# Patient Record
Sex: Female | Born: 1980 | Race: White | Hispanic: No | Marital: Married | Smoking: Former smoker
Health system: Southern US, Community
[De-identification: ages and names within clinical notes are randomized; demographics above are authoritative.]

## PROBLEM LIST (undated history)

## (undated) DIAGNOSIS — G43909 Migraine, unspecified, not intractable, without status migrainosus: Secondary | ICD-10-CM

## (undated) DIAGNOSIS — K219 Gastro-esophageal reflux disease without esophagitis: Secondary | ICD-10-CM

## (undated) DIAGNOSIS — Z8489 Family history of other specified conditions: Secondary | ICD-10-CM

## (undated) DIAGNOSIS — N200 Calculus of kidney: Secondary | ICD-10-CM

## (undated) DIAGNOSIS — J45909 Unspecified asthma, uncomplicated: Secondary | ICD-10-CM

## (undated) DIAGNOSIS — K911 Postgastric surgery syndromes: Secondary | ICD-10-CM

## (undated) DIAGNOSIS — T4145XA Adverse effect of unspecified anesthetic, initial encounter: Secondary | ICD-10-CM

## (undated) DIAGNOSIS — Z972 Presence of dental prosthetic device (complete) (partial): Secondary | ICD-10-CM

## (undated) DIAGNOSIS — Z98811 Dental restoration status: Secondary | ICD-10-CM

## (undated) DIAGNOSIS — M2241 Chondromalacia patellae, right knee: Secondary | ICD-10-CM

## (undated) DIAGNOSIS — T8859XA Other complications of anesthesia, initial encounter: Secondary | ICD-10-CM

## (undated) DIAGNOSIS — N809 Endometriosis, unspecified: Secondary | ICD-10-CM

## (undated) DIAGNOSIS — F909 Attention-deficit hyperactivity disorder, unspecified type: Secondary | ICD-10-CM

## (undated) DIAGNOSIS — G43019 Migraine without aura, intractable, without status migrainosus: Secondary | ICD-10-CM

## (undated) HISTORY — DX: Postgastric surgery syndromes: K91.1

## (undated) HISTORY — DX: Calculus of kidney: N20.0

## (undated) HISTORY — DX: Migraine without aura, intractable, without status migrainosus: G43.019

## (undated) HISTORY — PX: DIAGNOSTIC LAPAROSCOPY: SUR761

## (undated) HISTORY — PX: UPPER GI ENDOSCOPY: SHX6162

## (undated) HISTORY — DX: Gastro-esophageal reflux disease without esophagitis: K21.9

## (undated) HISTORY — DX: Attention-deficit hyperactivity disorder, unspecified type: F90.9

## (undated) HISTORY — DX: Endometriosis, unspecified: N80.9

---

## 2000-07-01 ENCOUNTER — Ambulatory Visit: Admission: RE | Admit: 2000-07-01 | Discharge: 2000-07-01 | Payer: Self-pay

## 2000-07-06 ENCOUNTER — Encounter
Admission: RE | Admit: 2000-07-06 | Discharge: 2000-07-21 | Payer: Self-pay | Admitting: Physical Medicine and Rehabilitation

## 2006-10-02 ENCOUNTER — Inpatient Hospital Stay (HOSPITAL_COMMUNITY): Admission: AD | Admit: 2006-10-02 | Discharge: 2006-10-02 | Payer: Self-pay | Admitting: Obstetrics & Gynecology

## 2008-07-20 ENCOUNTER — Ambulatory Visit (HOSPITAL_BASED_OUTPATIENT_CLINIC_OR_DEPARTMENT_OTHER): Admission: RE | Admit: 2008-07-20 | Discharge: 2008-07-20 | Payer: Self-pay | Admitting: Orthopedic Surgery

## 2008-07-20 HISTORY — PX: KNEE ARTHROSCOPY W/ LATERAL RELEASE: SHX1873

## 2010-03-20 ENCOUNTER — Ambulatory Visit: Payer: Self-pay | Admitting: Family Medicine

## 2011-01-14 NOTE — Op Note (Signed)
Janet Gallegos, Janet Gallegos             ACCOUNT NO.:  [id number]   MEDICAL RECORD NO.:  [id number]          PATIENT TYPE:  AMB   LOCATION:  [facility]                         FACILITY:  [facility]   PHYSICIAN:  Deidre Ala, M.D.    DATE OF BIRTH:  [birth year]   DATE OF PROCEDURE:  07/20/2008  DATE OF DISCHARGE:                               OPERATIVE REPORT   PRIMARY CARE PHYSICIAN:  Dr. Wilfrid Lund.   PREOPERATIVE DIAGNOSES:  1. 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  2. Medial and lateral plicas.  3. Tight lateral retinaculum.   POSTOPERATIVE DIAGNOSES:  1. 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  2. Medial and lateral plicas.  3. Tight lateral retinaculum.   PROCEDURE:  1. Right knee operative arthroscopy with medial and lateral plica      excisions.  2. Arthroscopic lateral retinacular release.   SURGEON:  1. Charlesetta Shanks, M.D.   ASSISTANT:  Phineas Semen, P.A.   ANESTHESIA:  General with LMA.   CULTURES:  None.   DRAINS:  None.   ESTIMATED BLOOD LOSS:  Minimal.   TOURNIQUET TIME:  22 minutes.   PATHOLOGIC FINDINGS AND HISTORY:  Janet Gallegos is a 30 year old female with  right knee pain, instability, catching, injury with history of patella  subluxations in the past.  She had a positive patella compression  apprehension test to lateral directed force with knee extension and a  significant lateral tilt of the patella on x-ray views.  She has pain  all around the patella with peripatellar synovitis.  She was presented  options and we felt the best would be to proceed with operative  arthroscopy, plica excision, ablation ablation chondroplasty as  indicated, lateral retinacular release for improved stability.   PROCEDURE IN DETAIL:  At surgery we found huge medial and lateral plicas  and a tight lateral retinaculum.  Her joint surfaces were soft on the  posterior medial patella but not completely broken down.  Her menisci  and ACL were  intact.  We did arthroscopic lateral retinacular release  for improved tilt and track and plica excisions.   With adequate anesthesia obtained using LMA technique, 1 gram Ancef  given IV prophylaxis, the patient was placed in the supine position.  The right lower extremity was prepped from the malleoli to the leg  holder in the standard fashion.  After standard prepping and draping  Esmarch site was used.  The tourniquet was let up to 350 mmHg.  Superior  and lateral inflow portal was made and the knee was insufflated with  normal saline with the arthroscopic pump.  Medial and lateral scope  portals were then made and the joint was thoroughly inspected.  I then  lysed the medial plica back to the sidewall and lysed the medial band.  I then probed the posterior patella.  I  checked the medial meniscus and  probed it.  ACL, lateral meniscus and probed it.  I reversed portals,  shaved out the lateral plica, observed tilt and track.  I then did an  arthroscopic lateral retinacular release from vastus lateralis to the  joint  line with the hook ArthroCare.  Bleeding points were cauterized.  The knee was irrigated through the scope 0.5% Marcaine with morphine  injected in and about the portals.  The portals were left open.  A bulky  sterile compressive dressing was applied with a lateral foam pad for  tamponade and EZ wrap placed.  The patient then having tolerated the  procedure well was awakened and taken to the recovery room in  satisfactory condition to be discharged per outpatient routine, given  Percocet for pain and told to call the office for an appointment for  recheck tomorrow.      Deidre Ala, M.D.  Electronically Signed     VEP/MEDQ  D:  07/20/2008  T:  07/20/2008  Job:  [id number]   cc:   [facility]   Wilfrid Lund  Fax: [phone]

## 2011-05-21 DIAGNOSIS — K644 Residual hemorrhoidal skin tags: Secondary | ICD-10-CM | POA: Insufficient documentation

## 2011-06-03 LAB — POCT HEMOGLOBIN-HEMACUE: Hemoglobin: 13.5

## 2011-06-03 LAB — POCT PREGNANCY, URINE: Preg Test, Ur: NEGATIVE

## 2012-06-20 ENCOUNTER — Ambulatory Visit: Payer: Self-pay | Admitting: Internal Medicine

## 2013-03-22 ENCOUNTER — Ambulatory Visit: Payer: Self-pay | Admitting: Specialist

## 2013-09-15 ENCOUNTER — Ambulatory Visit: Payer: Self-pay | Admitting: Gastroenterology

## 2013-10-04 ENCOUNTER — Ambulatory Visit: Payer: Self-pay | Admitting: Gastroenterology

## 2014-10-31 DIAGNOSIS — M2241 Chondromalacia patellae, right knee: Secondary | ICD-10-CM

## 2014-10-31 HISTORY — DX: Chondromalacia patellae, right knee: M22.41

## 2014-11-06 ENCOUNTER — Encounter (HOSPITAL_BASED_OUTPATIENT_CLINIC_OR_DEPARTMENT_OTHER): Payer: Self-pay | Admitting: *Deleted

## 2014-11-06 ENCOUNTER — Other Ambulatory Visit: Payer: Self-pay | Admitting: Orthopaedic Surgery

## 2014-11-09 NOTE — H&P (Signed)
Yemariam Magar Salonga is an 34 y.o. female.   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  She is here in referral through Dr. Renae Fickle about her right knee.  She's had trouble for many years.  He actually performed an arthroscopy with lateral release in 2009.  She went through therapy and did fairly well for many years.  She's used taping and braces.  Unfortunately things got much worse over the last year.  She continues on diclofenac for this problem and uses a brace but it has become limiting to her.  Her greatest trouble is pain and not so much instability.  She avoids stairs and squatting and lunging.  She had an injection this year by Dr. Renae Fickle.  She also apparently has been through a [place] series.   MRI:  I reviewed an MRI scan films and report of a study done at the [facility] on 05/21/14 of the right knee which demonstrates a trace knee effusion with superior plica.  There is a mild regular thickening of the deep inferior components of the medial patellar retinaculum.  There is a mild proximal patellar tendinopathy.  Mild general chondral thinning inferiorly on the patella is also noted.  Past Medical History  Diagnosis Date  . Migraines   . GERD (gastroesophageal reflux disease)     no current med.  . Asthma     prn inhaler  . Complication of anesthesia     states is hard to wake up post-op  . Family history of adverse reaction to anesthesia     states pt's mother woke up during a surgery  . 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 Dental bridge present upper  . Dental crown present     lower    Past Surgical History  Procedure Laterality Date  . Diagnostic laparoscopy      x 2  . Upper gi endoscopy    . Knee arthroscopy w/ lateral release Right 07/20/2008    Family History  Problem Relation Age of Onset  . Anesthesia problems Mother     woke up during surgery   Social History:  reports that she has been smoking  Cigarettes.  She has been smoking about 0.00 packs per day for the past 4 years. She has never used smokeless tobacco. She reports that she drinks alcohol. Her drug history is not on file.  Allergies:  Allergies  Allergen Reactions  . Sulfa Antibiotics Nausea And Vomiting    No prescriptions prior to admission    No results found for this or any previous visit (from the past 48 hour(s)). No results found.  Review of Systems  Musculoskeletal: Positive for joint pain.       Right knee  All other systems reviewed and are negative.   Height  (1.626 m), weight 65.772 kg (145 lb), last menstrual period 10/26/2014. Physical Exam  Constitutional: She is oriented to person, place, and time. She appears well-developed and well-nourished.  HENT:  Head: Normocephalic and atraumatic.  Eyes: Conjunctivae are normal. Pupils are equal, round, and reactive to light.  Neck: Normal range of motion.  Cardiovascular: Normal rate and regular rhythm.   Respiratory: Effort normal.  GI: Soft.  Musculoskeletal:  Right knee has no effusion.  She has some mild crepitation.  Her motion is full.  I get just some mild apprehension to patellar subluxation.  I cannot sublux her patella anymore than I can the patella on the  opposite knee.  Her quadriceps musculature looks normal.  Hip motion is full and pain-free.  Ligament exam of the knee is stable.  She has no joint line pain.  There is no palpable lymphadenopathy behind her knee.  Sensation and motor function are intact in her feet with palpable pulses on both sides.    Neurological: She is alert and oriented to person, place, and time.  Skin: Skin is warm and dry.  Psychiatric: She has a normal mood and affect. Her behavior is normal. Judgment and thought content normal.     Assessment/Plan Assessment: Right knee chondromalacia and mild instability with arthroscopy and lateral release 2009  Plan: Ashok CordiaSavanna has a combination of chondromalacia patella  and probably some mild instability.  Her greatest symptom seems to be pain rather than instability.  She's been through extensive physical therapy and bracing and even an arthroscopic lateral release back in 2009.  She's also been through multiple cortisone injections and apparently a course of [place] supplementation.  This remains a big problem for her.  I think what we can offer her surgically is a Fulkerson slide.  We would orient the cut obliquely to effectively elevate the tibial tubercle us we bring it in a medial direction.  Hopefully this will decompress her patellofemoral joint and lead to some relief.  I've gone over the nature this outpatient operation and risk of anesthesia, infection, and DVT.  Also stressed the importance of the postoperative rehabilitation protocol to optimize her result.  I think she might miss a week or two of her mostly seated work after this type of intervention.   Khristie Sak, Ginger OrganNDREW PAUL 11/09/2014, 11:51 AM

## 2014-11-10 ENCOUNTER — Ambulatory Visit (HOSPITAL_BASED_OUTPATIENT_CLINIC_OR_DEPARTMENT_OTHER): Payer: 59 | Admitting: Anesthesiology

## 2014-11-10 ENCOUNTER — Ambulatory Visit (HOSPITAL_BASED_OUTPATIENT_CLINIC_OR_DEPARTMENT_OTHER)
Admission: RE | Admit: 2014-11-10 | Discharge: 2014-11-10 | Disposition: A | Discharge status: Home / Self Care | Payer: 59 | Source: Ambulatory Visit | Attending: Orthopaedic Surgery | Admitting: Orthopaedic Surgery

## 2014-11-10 ENCOUNTER — Encounter (HOSPITAL_BASED_OUTPATIENT_CLINIC_OR_DEPARTMENT_OTHER)
Admission: RE | Disposition: A | Discharge status: Home / Self Care | Payer: Self-pay | Source: Ambulatory Visit | Attending: Orthopaedic Surgery

## 2014-11-10 ENCOUNTER — Encounter (HOSPITAL_BASED_OUTPATIENT_CLINIC_OR_DEPARTMENT_OTHER): Payer: Self-pay | Admitting: Anesthesiology

## 2014-11-10 DIAGNOSIS — K219 Gastro-esophageal reflux disease without esophagitis: Secondary | ICD-10-CM | POA: Diagnosis not present

## 2014-11-10 DIAGNOSIS — M2241 Chondromalacia patellae, right knee: Secondary | ICD-10-CM | POA: Insufficient documentation

## 2014-11-10 DIAGNOSIS — Z882 Allergy status to sulfonamides status: Secondary | ICD-10-CM | POA: Insufficient documentation

## 2014-11-10 DIAGNOSIS — M2211 Recurrent subluxation of patella, right knee: Secondary | ICD-10-CM | POA: Insufficient documentation

## 2014-11-10 DIAGNOSIS — G43909 Migraine, unspecified, not intractable, without status migrainosus: Secondary | ICD-10-CM | POA: Insufficient documentation

## 2014-11-10 DIAGNOSIS — J45909 Unspecified asthma, uncomplicated: Secondary | ICD-10-CM | POA: Insufficient documentation

## 2014-11-10 DIAGNOSIS — F17210 Nicotine dependence, cigarettes, uncomplicated: Secondary | ICD-10-CM | POA: Diagnosis not present

## 2014-11-10 HISTORY — DX: Migraine, unspecified, not intractable, without status migrainosus: G43.909

## 2014-11-10 HISTORY — DX: Gastro-esophageal reflux disease without esophagitis: K21.9

## 2014-11-10 HISTORY — DX: Unspecified asthma, uncomplicated: J45.909

## 2014-11-10 HISTORY — DX: Adverse effect of unspecified anesthetic, initial encounter: T41.45XA

## 2014-11-10 HISTORY — DX: Dental restoration status: Z98.811

## 2014-11-10 HISTORY — DX: Presence of dental prosthetic device (complete) (partial): Z97.2

## 2014-11-10 HISTORY — PX: KNEE ARTHROSCOPY WITH FULKERSON SLIDE: SHX5648

## 2014-11-10 HISTORY — DX: Other complications of anesthesia, initial encounter: T88.59XA

## 2014-11-10 HISTORY — DX: Chondromalacia patellae, right knee: M22.41

## 2014-11-10 HISTORY — PX: CHONDROPLASTY: SHX5177

## 2014-11-10 HISTORY — PX: KNEE ARTHROSCOPY WITH LATERAL RELEASE: SHX5649

## 2014-11-10 HISTORY — DX: Family history of other specified conditions: Z84.89

## 2014-11-10 LAB — POCT HEMOGLOBIN-HEMACUE: Hemoglobin: 12.8 g/dL (ref 12.0–15.0)

## 2014-11-10 SURGERY — ARTHROSCOPY, KNEE, WITH FULKERSON OSTEOTOMY
Anesthesia: General | Site: Knee | Laterality: Right

## 2014-11-10 MED ORDER — ONDANSETRON HCL 4 MG/2ML IJ SOLN
INTRAMUSCULAR | Status: DC | PRN
  Administered 2014-11-10: 4 mg via INTRAVENOUS

## 2014-11-10 MED ORDER — SUCCINYLCHOLINE CHLORIDE 20 MG/ML IJ SOLN
INTRAMUSCULAR | Status: AC
  Filled 2014-11-10: qty 1

## 2014-11-10 MED ORDER — BUPIVACAINE-EPINEPHRINE (PF) 0.5% -1:200000 IJ SOLN
INTRAMUSCULAR | Status: DC | PRN
  Administered 2014-11-10: 25 mL via PERINEURAL

## 2014-11-10 MED ORDER — MIDAZOLAM HCL 2 MG/2ML IJ SOLN
INTRAMUSCULAR | Status: AC
  Filled 2014-11-10: qty 2

## 2014-11-10 MED ORDER — LACTATED RINGERS IV SOLN
INTRAVENOUS | Status: DC
  Administered 2014-11-10: 12:00:00 via INTRAVENOUS

## 2014-11-10 MED ORDER — OXYCODONE HCL 5 MG PO TABS
5.0000 mg | ORAL_TABLET | Freq: Once | ORAL | Status: AC | PRN
  Administered 2014-11-10: 5 mg via ORAL

## 2014-11-10 MED ORDER — FENTANYL CITRATE 0.05 MG/ML IJ SOLN
INTRAMUSCULAR | Status: AC
  Filled 2014-11-10: qty 6

## 2014-11-10 MED ORDER — DEXAMETHASONE SODIUM PHOSPHATE 4 MG/ML IJ SOLN
INTRAMUSCULAR | Status: DC | PRN
  Administered 2014-11-10: 10 mg via INTRAVENOUS

## 2014-11-10 MED ORDER — LIDOCAINE HCL (CARDIAC) 20 MG/ML IV SOLN
INTRAVENOUS | Status: DC | PRN
  Administered 2014-11-10: 50 mg via INTRAVENOUS

## 2014-11-10 MED ORDER — ONDANSETRON HCL 4 MG/2ML IJ SOLN: 4.0000 mg | Freq: Once | INTRAMUSCULAR | Status: DC | PRN

## 2014-11-10 MED ORDER — HYDROMORPHONE HCL 1 MG/ML IJ SOLN
0.2500 mg | INTRAMUSCULAR | Status: DC | PRN
  Administered 2014-11-10 (×3): 0.5 mg via INTRAVENOUS

## 2014-11-10 MED ORDER — OXYCODONE HCL 5 MG PO TABS
ORAL_TABLET | ORAL | Status: AC
  Filled 2014-11-10: qty 1

## 2014-11-10 MED ORDER — SODIUM CHLORIDE 0.9 % IR SOLN
Status: DC | PRN
  Administered 2014-11-10: 3000 mL

## 2014-11-10 MED ORDER — FENTANYL CITRATE 0.05 MG/ML IJ SOLN
50.0000 ug | INTRAMUSCULAR | Status: DC | PRN
  Administered 2014-11-10: 100 ug via INTRAVENOUS

## 2014-11-10 MED ORDER — CHLORHEXIDINE GLUCONATE 4 % EX LIQD: 60.0000 mL | Freq: Once | CUTANEOUS | Status: DC

## 2014-11-10 MED ORDER — HYDROMORPHONE HCL 1 MG/ML IJ SOLN
INTRAMUSCULAR | Status: AC
  Filled 2014-11-10: qty 1

## 2014-11-10 MED ORDER — OXYCODONE HCL 5 MG/5ML PO SOLN: 5.0000 mg | Freq: Once | ORAL | Status: AC | PRN

## 2014-11-10 MED ORDER — FENTANYL CITRATE 0.05 MG/ML IJ SOLN
INTRAMUSCULAR | Status: DC | PRN
Start: 2014-11-10 — End: 2014-11-10
  Administered 2014-11-10 (×2): 50 ug via INTRAVENOUS

## 2014-11-10 MED ORDER — MIDAZOLAM HCL 5 MG/5ML IJ SOLN
INTRAMUSCULAR | Status: DC | PRN
  Administered 2014-11-10: 2 mg via INTRAVENOUS

## 2014-11-10 MED ORDER — LACTATED RINGERS IV SOLN
INTRAVENOUS | Status: DC
  Administered 2014-11-10 (×2): via INTRAVENOUS
  Administered 2014-11-10: 10 mL/h via INTRAVENOUS

## 2014-11-10 MED ORDER — MIDAZOLAM HCL 2 MG/2ML IJ SOLN
1.0000 mg | INTRAMUSCULAR | Status: DC | PRN
  Administered 2014-11-10: 2 mg via INTRAVENOUS

## 2014-11-10 MED ORDER — FENTANYL CITRATE 0.05 MG/ML IJ SOLN
INTRAMUSCULAR | Status: AC
  Filled 2014-11-10: qty 2

## 2014-11-10 MED ORDER — MIDAZOLAM HCL 2 MG/ML PO SYRP: 12.0000 mg | ORAL_SOLUTION | Freq: Once | ORAL | Status: DC | PRN

## 2014-11-10 MED ORDER — CEFAZOLIN SODIUM-DEXTROSE 2-3 GM-% IV SOLR
2.0000 g | INTRAVENOUS | Status: AC
  Administered 2014-11-10: 2 g via INTRAVENOUS

## 2014-11-10 MED ORDER — HYDROCODONE-ACETAMINOPHEN 5-325 MG PO TABS: 1.0000 | ORAL_TABLET | Freq: Four times a day (QID) | ORAL | Status: DC | PRN

## 2014-11-10 MED ORDER — PROPOFOL 10 MG/ML IV BOLUS
INTRAVENOUS | Status: DC | PRN
  Administered 2014-11-10: 200 mg via INTRAVENOUS

## 2014-11-10 SURGICAL SUPPLY — 69 items
BANDAGE ELASTIC 6 VELCRO ST LF (GAUZE/BANDAGES/DRESSINGS) ×3 IMPLANT
BANDAGE ESMARK 6X9 LF (GAUZE/BANDAGES/DRESSINGS) ×2 IMPLANT
BENZOIN TINCTURE PRP APPL 2/3 (GAUZE/BANDAGES/DRESSINGS) ×3 IMPLANT
BIT DRILL 2.5X110 QC LCP DISP (BIT) ×3 IMPLANT
BLADE CUDA 5.5 (BLADE) IMPLANT
BLADE GREAT WHITE 4.2 (BLADE) ×3 IMPLANT
BLADE MICRO SAGITTAL (BLADE) IMPLANT
BLADE SAGITTAL 25.0X1.27X90 (BLADE) ×3 IMPLANT
BLADE SURG 15 STRL LF DISP TIS (BLADE) ×2 IMPLANT
BLADE SURG 15 STRL SS (BLADE) ×1
BNDG ESMARK 6X9 LF (GAUZE/BANDAGES/DRESSINGS) ×3
BNDG GAUZE ELAST 4 BULKY (GAUZE/BANDAGES/DRESSINGS) ×3 IMPLANT
COVER BACK TABLE 60X90IN (DRAPES) ×3 IMPLANT
CUFF TOURNIQUET SINGLE 24IN (TOURNIQUET CUFF) IMPLANT
CUFF TOURNIQUET SINGLE 34IN LL (TOURNIQUET CUFF) ×3 IMPLANT
DRAPE ARTHROSCOPY W/POUCH 114 (DRAPES) ×3 IMPLANT
DRAPE OEC MINIVIEW 54X84 (DRAPES) ×3 IMPLANT
DRAPE U-SHAPE 47X51 STRL (DRAPES) ×3 IMPLANT
DRSG EMULSION OIL 3X3 NADH (GAUZE/BANDAGES/DRESSINGS) ×3 IMPLANT
DURAPREP 26ML APPLICATOR (WOUND CARE) ×3 IMPLANT
ELECT REM PT RETURN 9FT ADLT (ELECTROSURGICAL) ×3
ELECTRODE REM PT RTRN 9FT ADLT (ELECTROSURGICAL) ×2 IMPLANT
GAUZE SPONGE 4X4 12PLY STRL (GAUZE/BANDAGES/DRESSINGS) ×3 IMPLANT
GLOVE BIO SURGEON STRL SZ8 (GLOVE) ×6 IMPLANT
GLOVE BIOGEL PI IND STRL 6.5 (GLOVE) ×2 IMPLANT
GLOVE BIOGEL PI IND STRL 7.0 (GLOVE) ×4 IMPLANT
GLOVE BIOGEL PI IND STRL 8 (GLOVE) ×4 IMPLANT
GLOVE BIOGEL PI INDICATOR 6.5 (GLOVE) ×1
GLOVE BIOGEL PI INDICATOR 7.0 (GLOVE) ×2
GLOVE BIOGEL PI INDICATOR 8 (GLOVE) ×2
GOWN STRL REUS W/ TWL LRG LVL3 (GOWN DISPOSABLE) ×2 IMPLANT
GOWN STRL REUS W/ TWL XL LVL3 (GOWN DISPOSABLE) ×4 IMPLANT
GOWN STRL REUS W/TWL LRG LVL3 (GOWN DISPOSABLE) ×1
GOWN STRL REUS W/TWL XL LVL3 (GOWN DISPOSABLE) ×2
IMMOBILIZER KNEE 22 UNIV (SOFTGOODS) ×3 IMPLANT
IMMOBILIZER KNEE 24 THIGH 36 (MISCELLANEOUS) IMPLANT
IMMOBILIZER KNEE 24 UNIV (MISCELLANEOUS)
IV NS IRRIG 3000ML ARTHROMATIC (IV SOLUTION) ×6 IMPLANT
KNEE WRAP E Z 3 GEL PACK (MISCELLANEOUS) ×3 IMPLANT
MANIFOLD NEPTUNE II (INSTRUMENTS) ×3 IMPLANT
NDL SUT 6 .5 CRC .975X.05 MAYO (NEEDLE) IMPLANT
NEEDLE MAYO TAPER (NEEDLE)
PACK ARTHROSCOPY DSU (CUSTOM PROCEDURE TRAY) ×3 IMPLANT
PACK BASIN DAY SURGERY FS (CUSTOM PROCEDURE TRAY) ×3 IMPLANT
PENCIL BUTTON HOLSTER BLD 10FT (ELECTRODE) ×3 IMPLANT
SCREW CANC PT 4.0X45 (Screw) ×6 IMPLANT
SET ARTHROSCOPY TUBING (MISCELLANEOUS) ×1
SET ARTHROSCOPY TUBING LN (MISCELLANEOUS) ×2 IMPLANT
SHEET MEDIUM DRAPE 40X70 STRL (DRAPES) ×3 IMPLANT
SPONGE LAP 4X18 X RAY DECT (DISPOSABLE) ×3 IMPLANT
STRIP CLOSURE SKIN 1/2X4 (GAUZE/BANDAGES/DRESSINGS) ×3 IMPLANT
SUCTION FRAZIER TIP 10 FR DISP (SUCTIONS) ×3 IMPLANT
SUT FIBERWIRE #2 38 T-5 BLUE (SUTURE)
SUT MNCRL AB 3-0 PS2 18 (SUTURE) ×3 IMPLANT
SUT PDS AB 2-0 CT2 27 (SUTURE) ×3 IMPLANT
SUT VIC AB 0 CT1 27 (SUTURE) ×1
SUT VIC AB 0 CT1 27XBRD ANBCTR (SUTURE) ×2 IMPLANT
SUT VIC AB 0 SH 27 (SUTURE) IMPLANT
SUT VIC AB 2-0 SH 27 (SUTURE) ×1
SUT VIC AB 2-0 SH 27XBRD (SUTURE) ×2 IMPLANT
SUTURE FIBERWR #2 38 T-5 BLUE (SUTURE) IMPLANT
SYR 3ML 18GX1 1/2 (SYRINGE) IMPLANT
SYR BULB 3OZ (MISCELLANEOUS) ×3 IMPLANT
TOWEL OR 17X24 6PK STRL BLUE (TOWEL DISPOSABLE) ×6 IMPLANT
TOWEL OR NON WOVEN STRL DISP B (DISPOSABLE) ×6 IMPLANT
WAND 30 DEG SABER W/CORD (SURGICAL WAND) ×3 IMPLANT
WAND STAR VAC 90 (SURGICAL WAND) IMPLANT
WATER STERILE IRR 1000ML POUR (IV SOLUTION) ×3 IMPLANT
YANKAUER SUCT BULB TIP NO VENT (SUCTIONS) IMPLANT

## 2014-11-10 NOTE — Op Note (Signed)
#  [id number] 

## 2014-11-10 NOTE — Discharge Instructions (Signed)
Regional Anesthesia Blocks ° °1. Numbness or the inability to move the "blocked" extremity may last from 3-48 hours after placement. The length of time depends on the medication injected and your individual response to the medication. If the numbness is not going away after 48 hours, call your surgeon. ° °2. The extremity that is blocked will need to be protected until the numbness is gone and the  Strength has returned. Because you cannot feel it, you will need to take extra care to avoid injury. Because it may be weak, you may have difficulty moving it or using it. You may not know what position it is in without looking at it while the block is in effect. ° °3. For blocks in the legs and feet, returning to weight bearing and walking needs to be done carefully. You will need to wait until the numbness is entirely gone and the strength has returned. You should be able to move your leg and foot normally before you try and bear weight or walk. You will need someone to be with you when you first try to ensure you do not fall and possibly risk injury. ° °4. Bruising and tenderness at the needle site are common side effects and will resolve in a few days. ° °5. Persistent numbness or new problems with movement should be communicated to the surgeon or the [facility] ([phone])/ [facility] ([phone]). ° ° ° °Post Anesthesia Home Care Instructions ° °Activity: °Get plenty of rest for the remainder of the day. A responsible adult should stay with you for 24 hours following the procedure.  °For the next 24 hours, DO NOT: °-Drive a car °-Operate machinery °-Drink alcoholic beverages °-Take any medication unless instructed by your physician °-Make any legal decisions or sign important papers. ° °Meals: °Start with liquid foods such as gelatin or soup. Progress to regular foods as tolerated. Avoid greasy, spicy, heavy foods. If nausea and/or vomiting occur, drink only clear liquids until the  nausea and/or vomiting subsides. Call your physician if vomiting continues. ° °Special Instructions/Symptoms: °Your throat may feel dry or sore from the anesthesia or the breathing tube placed in your throat during surgery. If this causes discomfort, gargle with warm salt water. The discomfort should disappear within 24 hours. ° °

## 2014-11-10 NOTE — Progress Notes (Signed)
Assisted Dr. Crews with right, ultrasound guided, femoral block. Side rails up, monitors on throughout procedure. See vital signs in flow sheet. Tolerated Procedure well. 

## 2014-11-10 NOTE — Anesthesia Postprocedure Evaluation (Signed)
  Anesthesia Post-op Note  Patient: Janet Gallegos  Procedure(s) Performed: Procedure(s): RIGHT KNEE ARTHROSCOPY WITH FULKERSON SLIDE (Right) CHONDROPLASTY (Right) KNEE ARTHROSCOPY WITH LATERAL RELEASE (Right)  Patient Location: PACU  Anesthesia Type: General with regional for post op pain   Level of Consciousness: awake, alert  and oriented  Airway and Oxygen Therapy: Patient Spontanous Breathing  Post-op Pain: mild  Post-op Assessment: Post-op Vital signs reviewed  Post-op Vital Signs: Reviewed  Last Vitals:  Filed Vitals:   11/10/14 1600  BP: 107/69  Pulse: 74  Temp:   Resp: 10    Complications: No apparent anesthesia complications

## 2014-11-10 NOTE — Anesthesia Preprocedure Evaluation (Signed)
Anesthesia Evaluation  Patient identified by MRN, date of birth, ID band Patient awake    Reviewed: Allergy & Precautions, NPO status , Patient's Chart, lab work & pertinent test results  Airway Mallampati: I  TM Distance: >3 FB Neck ROM: Full    Dental  (+) Teeth Intact, Dental Advisory Given   Pulmonary asthma , Current Smoker,  breath sounds clear to auscultation        Cardiovascular Rhythm:Regular Rate:Normal     Neuro/Psych    GI/Hepatic GERD-  Medicated and Controlled,  Endo/Other    Renal/GU      Musculoskeletal   Abdominal   Peds  Hematology   Anesthesia Other Findings   Reproductive/Obstetrics                             Anesthesia Physical Anesthesia Plan  ASA: II  Anesthesia Plan: General   Post-op Pain Management:    Induction: Intravenous  Airway Management Planned: LMA  Additional Equipment:   Intra-op Plan:   Post-operative Plan: Extubation in OR  Informed Consent: I have reviewed the patients History and Physical, chart, labs and discussed the procedure including the risks, benefits and alternatives for the proposed anesthesia with the patient or authorized representative who has indicated his/her understanding and acceptance.   Dental advisory given  Plan Discussed with: CRNA, Anesthesiologist and Surgeon  Anesthesia Plan Comments:         Anesthesia Quick Evaluation

## 2014-11-10 NOTE — Interval H&P Note (Signed)
OK for surgery PD 

## 2014-11-10 NOTE — Transfer of Care (Signed)
Immediate Anesthesia Transfer of Care Note  Patient: Kandice RobinsonsSavanna L Mcculloh  Procedure(s) Performed: Procedure(s): RIGHT KNEE ARTHROSCOPY WITH FULKERSON SLIDE (Right) CHONDROPLASTY (Right) KNEE ARTHROSCOPY WITH LATERAL RELEASE (Right)  Patient Location: PACU  Anesthesia Type:GA combined with regional for post-op pain  Level of Consciousness: alert  and patient cooperative  Airway & Oxygen Therapy: Patient Spontanous Breathing and Patient connected to face mask oxygen  Post-op Assessment: Report given to RN and Post -op Vital signs reviewed and stable  Post vital signs: Reviewed and stable  Last Vitals:  Filed Vitals:   11/10/14 1310  BP:   Pulse: 82  Temp:   Resp: 12    Complications: No apparent anesthesia complications

## 2014-11-10 NOTE — Anesthesia Procedure Notes (Addendum)
Anesthesia Regional Block:  Femoral nerve block  Pre-Anesthetic Checklist: ,, timeout performed, Correct Patient, Correct Site, Correct Laterality, Correct Procedure, Correct Position, site marked, Risks and benefits discussed,  Surgical consent,  Pre-op evaluation,  At surgeon's request and post-op pain management  Laterality: Right and Lower  Prep: chloraprep       Needles:  Injection technique: Single-shot  Needle Type: Echogenic Needle     Needle Length: 9cm 9 cm Needle Gauge: 21 and 21 G    Additional Needles:  Procedures: ultrasound guided (picture in chart) Femoral nerve block Narrative:  Start time: 11/10/2014 12:36 PM End time: 11/10/2014 12:43 PM Injection made incrementally with aspirations every 5 mL.  Performed by: Personally    Procedure Name: LMA Insertion Date/Time: 11/10/2014 1:43 PM Performed by: [place] DesanctisLINKA, Mackena Plummer L Pre-anesthesia Checklist: Patient identified, Emergency Drugs available, Suction available, Patient being monitored and Timeout performed Patient Re-evaluated:Patient Re-evaluated prior to inductionOxygen Delivery Method: Circle System Utilized Preoxygenation: Pre-oxygenation with 100% oxygen Intubation Type: IV induction Ventilation: Mask ventilation without difficulty LMA: LMA inserted LMA Size: 4.0 Number of attempts: 1 Airway Equipment and Method: Bite block Placement Confirmation: positive ETCO2 Tube secured with: Tape Dental Injury: Teeth and Oropharynx as per pre-operative assessment

## 2014-11-13 ENCOUNTER — Encounter (HOSPITAL_BASED_OUTPATIENT_CLINIC_OR_DEPARTMENT_OTHER): Payer: Self-pay | Admitting: Orthopaedic Surgery

## 2014-11-13 NOTE — Op Note (Signed)
NAMEWREN, PRYCE             ACCOUNT NO.:  [id number]  MEDICAL RECORD NO.:  [id number]  LOCATION:                                 FACILITY:  PHYSICIAN:  Lubertha Basque. Rowdy Guerrini, M.D.DATE OF BIRTH:  1981/05/28  DATE OF PROCEDURE:  11/10/2014 DATE OF DISCHARGE:  11/10/2014                              OPERATIVE REPORT   PREOPERATIVE DIAGNOSIS:  Right knee chondromalacia patella.  POSTOPERATIVE DIAGNOSIS:  Right knee chondromalacia patella.  PROCEDURE: 1. Right knee arthroscopic chondroplasty. 2. Right knee arthroscopic lateral release. 3. Right knee open Fulkerson slide procedure.  ANESTHESIA:  General and block.  ATTENDING SURGEON:  Lubertha Basque. Jerl Santos, M.D.  ASSISTANT:  Elodia Florence, PA.  INDICATION FOR PROCEDURE:  The patient is a 34 year old woman with a long history of anterior knee pain and possible subluxation of the patella.  She has been treated with physical therapy and bracing and even an arthroscopic lateral release in years past.  Unfortunately, she persists with difficulty.  This pain limits her ability to climb stairs and squat and remain active.  She is offered a Fulkerson slide procedure and arthroscopy at this point.  Informed operative consent was obtained after discussion of possible complications including reaction to anesthesia, infection, DVT.  The importance of postoperative rehabilitation protocol to optimize result was stressed extensively with the patient.  SUMMARY OF FINDINGS AND PROCEDURE:  Under general anesthesia and a block, a right knee arthroscopy was performed followed by a Fulkerson slide procedure.  On arthroscopy, she had some breakdown of the inferior pole of the patella and to a lesser degree the medial aspect of the intertrochlear groove.  There was no exposed bone.  A chondroplasty was done.  The patella did track in a slightly lateral position and there was some significant pressure on drive-through at the patellofemoral joint.  I  performed an arthroscopic lateral release and then we performed an open Fulkerson slide procedure with the saw blade angled under direction to enable [place] to elevate the tibial tubercle as much as we took it medial.  It was secured with 2 small fragment set screws. The scope was then reintroduced and the pressure in the patellofemoral joint had been greatly decreased.  The patella tracked in a normal position.  She was closed primarily and discharged home.  Elodia Florence assisted throughout and was invaluable to the completion of the case mostly in that he helped pass instruments to make this possible.  He also closed simultaneously to help minimize OR time.  I used fluoroscopy throughout the case and read these views myself.  This was necessary to judge placement of hardware in the tibia.  DESCRIPTION OF PROCEDURE:  The patient was taken to the operating suite where general anesthetic was applied without difficulty.  She was also given a block in the pre-anesthesia area.  She was positioned supine and prepped and draped in normal sterile fashion.  After the administration of preop IV Kefzol and an appropriate time out, the right leg was elevated, exsanguinated, and tourniquet inflated about the thigh.  We then performed an arthroscopy through a total of 3 portals.  There were no degenerative changes in the medial and lateral and the meniscal  structures appeared normal.  The ACL was intact.  The patellofemoral joint was as described above.  A chondroplasty was done but no bare bone was exposed.  I then performed the arthroscopic lateral release with an arthroscopic cautery device.  I did my best to control some small bleeders with the cautery.  We then removed the arthroscopic equipment. I made an anteromedial longitudinal incision from the tibial tubercle distal.  Dissection was carried down to the insertion of the patellar tendon at the tibial tubercle.  I extended the lateral release to  the lateral border of this structure.  I then went down the medial border and planned our saw cut.  I cut from medial to lateral and tapered from proximal to distal.  I angled the saw blade 45 degrees to enable us to bring the tibial tubercle anterior as we shifted it medial after the osteotomy.  Once the osteotomy was completed, I shifted this structure medial about a cm.  This also took off the anterior about the same amount.  I did leave this attached distally so as not to change the length of the extensor mechanism.  I then placed 2 partially threaded small fragment site cancellous screws across the osteotomy site.  I used fluoroscopy to confirm that there was minimal penetration of the posterior cortex.  The knee ranged fully and our osteotomy was felt to be very stable with our screw fixation.  The scope was then reintroduced.  At this point, there was an easy drive-through sign consistent with minimal pressure at the patellofemoral joint.  The patella tracked in a completely normal position on flexion.  The wounds were irrigated followed by removal of arthroscopic equipment.  The tourniquet was deflated and a small amount of bleeding was easily controlled with some pressure and Bovie cautery.  We reapproximated the deep tissues with 0 Vicryl and the subcutaneous tissues with 2-0 undyed Vicryl.  Skin was reapproximated with a subcuticular stitch followed by Steri-Strips with benzoin.  Adaptic was applied followed by dry gauze and loose Ace wrap and a knee immobilizer.  Estimated blood loss and intraoperative fluids can be obtained from anesthesia records as can accurate tourniquet time.  DISPOSITION:  The patient was extubated in the operating room and taken to recovery room in stable condition.  She was to go home same-day and follow up in the office in less than a week.  I will contact her by phone tonight.     Lubertha BasquePeter G. Jerl Santosalldorf, M.D.     PGD/MEDQ  D:  11/10/2014  T:   11/11/2014  Job:  [id number]

## 2015-07-19 ENCOUNTER — Encounter: Payer: Self-pay | Admitting: *Deleted

## 2015-07-30 NOTE — Discharge Instructions (Signed)
T & A INSTRUCTION SHEET - [facility] °[facility] ° °CREIGHTON VAUGHT, MD °PAUL H. JUENGEL, MD  °P. SCOTT BENNETT °CHAPMAN MCQUEEN, MD ° °[address] [place], [place] [address] TEL. [phone] °[address] [place] [address] [phone] ° °INFORMATION SHEET FOR A TONSILLECTOMY AND ADENDOIDECTOMY ° °About Your Tonsils and Adenoids ° The tonsils and adenoids are normal body tissues that are part of our immune system.  They normally help to protect us against diseases that may enter our mouth and nose.  However, sometimes the tonsils and/or adenoids become too large and obstruct our breathing, especially at night. °  ° If either of these things happen it helps to remove the tonsils and adenoids in order to become healthier. The operation to remove the tonsils and adenoids is called a tonsillectomy and adenoidectomy. ° °The Location of Your Tonsils and Adenoids ° The tonsils are located in the back of the throat on both side and sit in a cradle of muscles. The adenoids are located in the roof of the mouth, behind the nose, and closely associated with the opening of the Eustachian tube to the ear. ° °Surgery on Tonsils and Adenoids ° A tonsillectomy and adenoidectomy is a short operation which takes about thirty minutes.  This includes being put to sleep and being awakened.  Tonsillectomies and adenoidectomies are performed at [facility] and may require observation period in the recovery room prior to going home. ° °Following the Operation for a Tonsillectomy ° A cautery machine is used to control bleeding.  Bleeding from a tonsillectomy and adenoidectomy is minimal and postoperatively the risk of bleeding is approximately four percent, although this rarely life threatening. ° ° ° °After your tonsillectomy and adenoidectomy post-op care at home: ° °1. Our patients are able to go home the same day.  You may be given prescriptions for pain  medications and antibiotics, if indicated. °2. It is extremely important to remember that fluid intake is of utmost importance after a tonsillectomy.  The amount that you drink must be maintained in the postoperative period.  A good indication of whether a child is getting enough fluid is whether his/her urine output is constant.  As long as children are urinating or wetting their diaper every 6 - 8 hours this is usually enough fluid intake.   °3. Although rare, this is a risk of some bleeding in the first ten days after surgery.  This is usually occurs between day five and nine postoperatively.  This risk of bleeding is approximately four percent.  If you or your child should have any bleeding you should remain calm and notify our office or go directly to the Emergency Room at [facility] where they will contact us. Our doctors are available seven days a week for notification.  We recommend sitting up quietly in a chair, place an ice pack on the front of the neck and spitting out the blood gently until we are able to contact you.  Adults should gargle gently with ice water and this may help stop the bleeding.  If the bleeding does not stop after a short time, i.e. 10 to 15 minutes, or seems to be increasing again, please contact us or go to the hospital.   °4. It is common for the pain to be worse at 5 - 7 days postoperatively.  This occurs because the “scab” is peeling off and the mucous membrane (skin of   the throat) is growing back where the tonsils were.   °5. It is common for a low-grade fever, less than 102, during the first week after a tonsillectomy and adenoidectomy.  It is usually due to not drinking enough liquids, and we suggest your use liquid Tylenol or the pain medicine with Tylenol prescribed in order to keep your temperature below 102.  Please follow the directions on the back of the bottle. °6. Do not take aspirin or any products that contain aspirin such as Bufferin, Anacin,  Ecotrin, aspirin gum, Goodies, BC headache powders, etc., after a T&A because it can promote bleeding.  Please check with our office before administering any other medication that may been prescribed by other doctors during the two week post-operative period. °7. If you happen to look in the mirror or into your child’s mouth you will see white/gray patches on the back of the throat.  This is what a scab looks like in the mouth and is normal after having a T&A.  It will disappear once the tonsil area heals completely. However, it may cause a noticeable odor, and this too will disappear with time.     °8. You or your child may experience ear pain after having a T&A.  This is called referred pain and comes from the throat, but it is felt in the ears.  Ear pain is quite common and expected.  It will usually go away after ten days.  There is usually nothing wrong with the ears, and it is primarily due to the healing area stimulating the nerve to the ear that runs along the side of the throat.  Use either the prescribed pain medicine or Tylenol as needed.  °9. The throat tissues after a tonsillectomy are obviously sensitive.  Smoking around children who have had a tonsillectomy significantly increases the risk of bleeding.  DO NOT SMOKE!  ° °General Anesthesia, Adult, Care After °Refer to this sheet in the next few weeks. These instructions provide you with information on caring for yourself after your procedure. Your health care provider may also give you more specific instructions. Your treatment has been planned according to current medical practices, but problems sometimes occur. Call your health care provider if you have any problems or questions after your procedure. °WHAT TO EXPECT AFTER THE PROCEDURE °After the procedure, it is typical to experience: °· Sleepiness. °· Nausea and vomiting. °HOME CARE INSTRUCTIONS °· For the first 24 hours after general anesthesia: °¨ Have a responsible person with you. °¨ Do not  drive a car. If you are alone, do not take public transportation. °¨ Do not drink alcohol. °¨ Do not take medicine that has not been prescribed by your health care provider. °¨ Do not sign important papers or make important decisions. °¨ You may resume a normal diet and activities as directed by your health care provider. °· Change bandages (dressings) as directed. °· If you have questions or problems that seem related to general anesthesia, call the hospital and ask for the anesthetist or anesthesiologist on call. °SEEK MEDICAL CARE IF: °· You have nausea and vomiting that continue the day after anesthesia. °· You develop a rash. °SEEK IMMEDIATE MEDICAL CARE IF:  °· You have difficulty breathing. °· You have chest pain. °· You have any allergic problems. °  °This information is not intended to replace advice given to you by your health care provider. Make sure you discuss any questions you have with your health care provider. °  °Document   Released: 11/24/2000 Document Revised: 09/08/2014 Document Reviewed: 12/17/2011 °Elsevier Interactive Patient Education ©2016 [employer] ° °

## 2015-07-31 ENCOUNTER — Ambulatory Visit: Payer: 59 | Admitting: Anesthesiology

## 2015-07-31 ENCOUNTER — Encounter: Payer: Self-pay | Admitting: *Deleted

## 2015-07-31 ENCOUNTER — Encounter
Admission: RE | Disposition: A | Discharge status: Home / Self Care | Payer: Self-pay | Source: Ambulatory Visit | Attending: Otolaryngology

## 2015-07-31 ENCOUNTER — Ambulatory Visit
Admission: RE | Admit: 2015-07-31 | Discharge: 2015-07-31 | Disposition: A | Discharge status: Home / Self Care | Payer: 59 | Source: Ambulatory Visit | Attending: Otolaryngology | Admitting: Otolaryngology

## 2015-07-31 DIAGNOSIS — Z882 Allergy status to sulfonamides status: Secondary | ICD-10-CM | POA: Insufficient documentation

## 2015-07-31 DIAGNOSIS — J3501 Chronic tonsillitis: Secondary | ICD-10-CM | POA: Diagnosis not present

## 2015-07-31 DIAGNOSIS — F17200 Nicotine dependence, unspecified, uncomplicated: Secondary | ICD-10-CM | POA: Diagnosis not present

## 2015-07-31 DIAGNOSIS — Z7951 Long term (current) use of inhaled steroids: Secondary | ICD-10-CM | POA: Diagnosis not present

## 2015-07-31 DIAGNOSIS — Z823 Family history of stroke: Secondary | ICD-10-CM | POA: Insufficient documentation

## 2015-07-31 DIAGNOSIS — Z8489 Family history of other specified conditions: Secondary | ICD-10-CM | POA: Insufficient documentation

## 2015-07-31 DIAGNOSIS — J45909 Unspecified asthma, uncomplicated: Secondary | ICD-10-CM | POA: Diagnosis not present

## 2015-07-31 DIAGNOSIS — K219 Gastro-esophageal reflux disease without esophagitis: Secondary | ICD-10-CM | POA: Diagnosis not present

## 2015-07-31 HISTORY — PX: TONSILLECTOMY: SHX5217

## 2015-07-31 SURGERY — TONSILLECTOMY
Anesthesia: General | Wound class: Clean Contaminated

## 2015-07-31 MED ORDER — GLYCOPYRROLATE 0.2 MG/ML IJ SOLN
INTRAMUSCULAR | Status: DC | PRN
  Administered 2015-07-31: .1 mg via INTRAVENOUS

## 2015-07-31 MED ORDER — SUCCINYLCHOLINE CHLORIDE 20 MG/ML IJ SOLN
INTRAMUSCULAR | Status: DC | PRN
  Administered 2015-07-31: 100 mg via INTRAVENOUS

## 2015-07-31 MED ORDER — PROMETHAZINE HCL 25 MG/ML IJ SOLN: 6.2500 mg | INTRAMUSCULAR | Status: DC | PRN

## 2015-07-31 MED ORDER — FENTANYL CITRATE (PF) 100 MCG/2ML IJ SOLN: 25.0000 ug | INTRAMUSCULAR | Status: DC | PRN

## 2015-07-31 MED ORDER — HYDROCODONE-ACETAMINOPHEN 7.5-325 MG/15ML PO SOLN: ORAL | Status: DC

## 2015-07-31 MED ORDER — LIDOCAINE HCL (CARDIAC) 20 MG/ML IV SOLN
INTRAVENOUS | Status: DC | PRN
  Administered 2015-07-31: 40 mg via INTRAVENOUS

## 2015-07-31 MED ORDER — IBUPROFEN 100 MG/5ML PO SUSP
400.0000 mg | Freq: Once | ORAL | Status: AC
  Administered 2015-07-31: 400 mg via ORAL

## 2015-07-31 MED ORDER — FENTANYL CITRATE (PF) 100 MCG/2ML IJ SOLN
INTRAMUSCULAR | Status: DC | PRN
  Administered 2015-07-31: 100 ug via INTRAVENOUS
  Administered 2015-07-31 (×2): 25 ug via INTRAVENOUS

## 2015-07-31 MED ORDER — ONDANSETRON HCL 4 MG/2ML IJ SOLN
INTRAMUSCULAR | Status: DC | PRN
  Administered 2015-07-31: 4 mg via INTRAVENOUS

## 2015-07-31 MED ORDER — ACETAMINOPHEN 10 MG/ML IV SOLN
1000.0000 mg | Freq: Once | INTRAVENOUS | Status: AC
  Administered 2015-07-31: 1000 mg via INTRAVENOUS

## 2015-07-31 MED ORDER — MIDAZOLAM HCL 5 MG/5ML IJ SOLN
INTRAMUSCULAR | Status: DC | PRN
Start: 2015-07-31 — End: 2015-07-31
  Administered 2015-07-31: 2 mg via INTRAVENOUS

## 2015-07-31 MED ORDER — LACTATED RINGERS IV SOLN
INTRAVENOUS | Status: DC
  Administered 2015-07-31: 10:00:00 via INTRAVENOUS

## 2015-07-31 MED ORDER — BUPIVACAINE-EPINEPHRINE 0.25% -1:200000 IJ SOLN
INTRAMUSCULAR | Status: DC | PRN
  Administered 2015-07-31: 2 mL

## 2015-07-31 MED ORDER — AMOXICILLIN 400 MG/5ML PO SUSR: ORAL | Status: DC

## 2015-07-31 MED ORDER — PROPOFOL 10 MG/ML IV BOLUS
INTRAVENOUS | Status: DC | PRN
  Administered 2015-07-31: 50 mg via INTRAVENOUS
  Administered 2015-07-31: 150 mg via INTRAVENOUS

## 2015-07-31 MED ORDER — DEXAMETHASONE SODIUM PHOSPHATE 4 MG/ML IJ SOLN
INTRAMUSCULAR | Status: DC | PRN
  Administered 2015-07-31: 10 mg via INTRAVENOUS

## 2015-07-31 MED ORDER — PREDNISOLONE 15 MG/5ML PO SOLN: ORAL | Status: DC

## 2015-07-31 SURGICAL SUPPLY — 16 items
CANISTER SUCT 1200ML W/VALVE (MISCELLANEOUS) ×2 IMPLANT
CATH ROBINSON RED A/P 10FR (CATHETERS) ×2 IMPLANT
COAG SUCT 10F 3.5MM HAND CTRL (MISCELLANEOUS) ×2 IMPLANT
DECANTER SPIKE VIAL GLASS SM (MISCELLANEOUS) ×2 IMPLANT
ELECT CAUTERY BLADE TIP 2.5 (TIP) ×2
ELECTRODE CAUTERY BLDE TIP 2.5 (TIP) ×1 IMPLANT
GLOVE BIO SURGEON STRL SZ7.5 (GLOVE) ×4 IMPLANT
KIT ROOM TURNOVER OR (KITS) ×2 IMPLANT
NEEDLE HYPO 25GX1X1/2 BEV (NEEDLE) ×2 IMPLANT
NS IRRIG 500ML POUR BTL (IV SOLUTION) ×2 IMPLANT
PACK TONSIL/ADENOIDS (PACKS) ×2 IMPLANT
PAD GROUND ADULT SPLIT (MISCELLANEOUS) ×2 IMPLANT
PENCIL ELECTRO HAND CTR (MISCELLANEOUS) ×2 IMPLANT
SOL ANTI-FOG 6CC FOG-OUT (MISCELLANEOUS) ×1 IMPLANT
SOL FOG-OUT ANTI-FOG 6CC (MISCELLANEOUS) ×1
SYRINGE 10CC LL (SYRINGE) ×2 IMPLANT

## 2015-07-31 NOTE — Anesthesia Procedure Notes (Addendum)
Procedure Name: Intubation Date/Time: 07/31/2015 11:50 AM Performed by: Jimmy PicketAMYOT, Betsi Crespi Pre-anesthesia Checklist: Patient identified, Emergency Drugs available, Suction available, Patient being monitored and Timeout performed Patient Re-evaluated:Patient Re-evaluated prior to inductionOxygen Delivery Method: Circle system utilized Preoxygenation: Pre-oxygenation with 100% oxygen Intubation Type: IV induction and Rapid sequence Laryngoscope Size: Hyacinth MeekerMiller and 2 Grade View: Grade I Tube type: Oral Rae Tube size: 7.0 mm Number of attempts: 1 Placement Confirmation: ETT inserted through vocal cords under direct vision,  positive ETCO2 and breath sounds checked- equal and bilateral Tube secured with: Tape Dental Injury: Teeth and Oropharynx as per pre-operative assessment

## 2015-07-31 NOTE — Transfer of Care (Signed)
Immediate Anesthesia Transfer of Care Note  Patient: Janet Gallegos  Procedure(s) Performed: Procedure(s): TONSILLECTOMY (N/A)  Patient Location: PACU  Anesthesia Type: General  Level of Consciousness: awake, alert  and patient cooperative  Airway and Oxygen Therapy: Patient Spontanous Breathing and Patient connected to supplemental oxygen  Post-op Assessment: Post-op Vital signs reviewed, Patient's Cardiovascular Status Stable, Respiratory Function Stable, Patent Airway and No signs of Nausea or vomiting  Post-op Vital Signs: Reviewed and stable  Complications: No apparent anesthesia complications

## 2015-07-31 NOTE — Anesthesia Preprocedure Evaluation (Signed)
Anesthesia Evaluation  Patient identified by MRN, date of birth, ID band Patient awake    Reviewed: Allergy & Precautions, NPO status , Patient's Chart, lab work & pertinent test results  Airway Mallampati: I  TM Distance: >3 FB Neck ROM: Full    Dental no notable dental hx.    Pulmonary asthma , former smoker,    Pulmonary exam normal breath sounds clear to auscultation       Cardiovascular negative cardio ROS Normal cardiovascular exam Rhythm:Regular Rate:Normal     Neuro/Psych negative neurological ROS  negative psych ROS   GI/Hepatic Neg liver ROS, GERD  Poorly Controlled,  Endo/Other  negative endocrine ROS  Renal/GU negative Renal ROS  negative genitourinary   Musculoskeletal negative musculoskeletal ROS (+)   Abdominal   Peds negative pediatric ROS (+)  Hematology negative hematology ROS (+)   Anesthesia Other Findings   Reproductive/Obstetrics negative OB ROS                             Anesthesia Physical Anesthesia Plan  ASA: II  Anesthesia Plan: General   Post-op Pain Management:    Induction: Intravenous  Airway Management Planned: Oral ETT  Additional Equipment:   Intra-op Plan:   Post-operative Plan: Extubation in OR  Informed Consent: I have reviewed the patients History and Physical, chart, labs and discussed the procedure including the risks, benefits and alternatives for the proposed anesthesia with the patient or authorized representative who has indicated his/her understanding and acceptance.   Dental advisory given  Plan Discussed with: CRNA  Anesthesia Plan Comments: (Pt with symptomatic GERD.  Will perform RSI.)        Anesthesia Quick Evaluation

## 2015-07-31 NOTE — Anesthesia Postprocedure Evaluation (Signed)
Anesthesia Post Note  Patient: Janet Gallegos  Procedure(s) Performed: Procedure(s) (LRB): TONSILLECTOMY (N/A)  Patient location during evaluation: PACU Anesthesia Type: General Level of consciousness: awake and alert Pain management: pain level controlled Vital Signs Assessment: post-procedure vital signs reviewed and stable Respiratory status: spontaneous breathing, nonlabored ventilation, respiratory function stable and patient connected to nasal cannula oxygen Cardiovascular status: blood pressure returned to baseline and stable Postop Assessment: no signs of nausea or vomiting Anesthetic complications: no    Jeyren Danowski C

## 2015-07-31 NOTE — Op Note (Signed)
07/31/2015  12:13 PM    Lorrain L Mcgowan  [id number]   Pre-Op Diagnosis:  CHRONIC TONSILLITIS, TONSILLOLITHIASIS Post-op Diagnosis: CHRONIC TONSILLITIS, TONSILLOLITHIASIS   Procedure: Tonsillectomy Surgeon:  Geanie LoganBennett, Shreena Baines S  Anesthesia:  General endotracheal  EBL:  Less than 25 cc  Complications:  None  Findings: 2+ cryptic tonsils. No significant adenoid tissue was present in the nasopharynx  Procedure: The patient was taken to the Operating Room and placed in the supine position.  After induction of general endotracheal anesthesia, the table was turned 90 degrees and the patient was draped in the usual fashion for adenoidectomy with the eyes protected.  A mouth gag was inserted into the oral cavity to open the mouth, and examination of the oropharynx showed the uvula was non-bifid. The palate was palpated, and there was no evidence of submucous cleft. Examination of the nasopharynx showed no obstructing adenoids. The right tonsil was grasped with an Allis clamp and resected from the tonsillar fossa in the usual fashion with the Bovie. The left tonsil was resected in the same fashion. The Bovie was used to obtain hemostasis. Each tonsillar fossa was then carefully injected with 0.25% marcaine with epinephrine, 1:200,000, avoiding intravascular injection. The nose and throat were irrigated and suctioned to remove any adenoid debris or blood clot. The red rubber catheter and mouth gag were  removed with no evidence of active bleeding.  The patient was then returned to the anesthesiologist for awakening, and was taken to the Recovery Room in stable condition.  Cultures:  None.  Specimens:  Tonsils.  Disposition:   PACU to home  Plan: Soft, bland diet and push fluids. Take pain medications and antibiotics as prescribed. No strenuous activity for 2 weeks. Follow-up in 3 weeks.  Sandi MealyBennett, Tavis Kring S 07/31/2015 12:13 PM

## 2015-07-31 NOTE — H&P (Signed)
History and physical reviewed and will be scanned in later. No change in medical status reported by the patient or family, appears stable for surgery. All questions regarding the procedure answered, and patient (or family if a child) expressed understanding of the procedure.  Janet Gallegos @TODAY@ 

## 2015-08-01 ENCOUNTER — Encounter: Payer: Self-pay | Admitting: Otolaryngology

## 2015-08-02 LAB — SURGICAL PATHOLOGY

## 2016-08-12 DIAGNOSIS — M545 Low back pain, unspecified: Secondary | ICD-10-CM | POA: Insufficient documentation

## 2016-08-12 DIAGNOSIS — M25519 Pain in unspecified shoulder: Secondary | ICD-10-CM | POA: Insufficient documentation

## 2016-09-18 ENCOUNTER — Ambulatory Visit: Payer: 59 | Admitting: Neurology

## 2016-09-23 ENCOUNTER — Ambulatory Visit (INDEPENDENT_AMBULATORY_CARE_PROVIDER_SITE_OTHER): Payer: 59 | Admitting: Neurology

## 2016-09-23 ENCOUNTER — Encounter: Payer: Self-pay | Admitting: Neurology

## 2016-09-23 DIAGNOSIS — G43019 Migraine without aura, intractable, without status migrainosus: Secondary | ICD-10-CM

## 2016-09-23 HISTORY — DX: Migraine without aura, intractable, without status migrainosus: G43.019

## 2016-09-23 MED ORDER — TOPIRAMATE 25 MG PO TABS: ORAL_TABLET | ORAL | 3 refills | Status: DC

## 2016-09-23 NOTE — Patient Instructions (Signed)
   Topamax (topiramate) is a seizure medication that has an FDA approval for seizures and for migraine headache. Potential side effects of this medication include weight loss, cognitive slowing, tingling in the fingers and toes, and carbonated drinks will taste bad. If any significant side effects are noted on this drug, please contact our office.  

## 2016-09-23 NOTE — Progress Notes (Signed)
Reason for visit: Headache  Referring physician: Dr. Jola Schmidt is a 35 y.o. female  History of present illness:  [name] is a 36 year old right-handed white female with a history of migraine headaches since age 36. Her usual headaches are associated with right or left-sided discomfort, she may have a visual aura prior to the headache associated with "trailers" on moving objects, and then colored lights. The patient generally will have a good response to Maxalt with the headaches. The patient had severe headaches when she will was in her early teenage years, but the headaches have markedly improved until just recently. The patient began having headaches again in August 2017, and they have been daily since October 2017. The headaches are in the back of the head with sharp pain that projects to the right periorbital area. The headache is associated with blurring of vision, and may be quite severe at times. The patient denies any neck stiffness. She has no nausea or vomiting, but she has chronic daily low grade queasiness and she may take Zofran for this. The patient has not had any dizziness, or numbness or weakness of the extremities. She does report some difficulty with concentration during the headache. She may have photophobia, but no phonophobia with the headache. She denies any allergy symptoms or sinus drainage. She is sent to this office for an evaluation. There is no family history of migraine. The only medication she has been treated on a daily basis in the past for the headaches has been amitriptyline.  Past Medical History:  Diagnosis Date  . Acid reflux   . Asthma    prn inhaler  . 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 Common migraine with intractable migraine 09/23/2016  . Complication of anesthesia    states is hard to wake up post-op  . Dental bridge present upper  . Dental crown present    lower  . Dumping syndrome   . Endometriosis   . Family history  of adverse reaction to anesthesia    states pt's mother woke up during a surgery  . GERD (gastroesophageal reflux disease)    no current med.  Marland Kitchen Migraines     Past Surgical History:  Procedure Laterality Date  . CHONDROPLASTY Right 11/10/2014   Procedure: CHONDROPLASTY;  Surgeon: Marcene Corning, MD;  Location: [facility];  Service: Orthopedics;  Laterality: Right;  . DIAGNOSTIC LAPAROSCOPY     Prior to 2010 x 2  . KNEE ARTHROSCOPY W/ LATERAL RELEASE Right 07/20/2008  . KNEE ARTHROSCOPY WITH FULKERSON SLIDE Right 11/10/2014   Procedure: RIGHT KNEE ARTHROSCOPY WITH Conan Bowens;  Surgeon: Marcene Corning, MD;  Location: [facility];  Service: Orthopedics;  Laterality: Right;  . KNEE ARTHROSCOPY WITH LATERAL RELEASE Right 11/10/2014   Procedure: KNEE ARTHROSCOPY WITH LATERAL RELEASE;  Surgeon: Marcene Corning, MD;  Location: [facility];  Service: Orthopedics;  Laterality: Right;  . TONSILLECTOMY N/A 07/31/2015   Procedure: TONSILLECTOMY;  Surgeon: Geanie Logan, MD;  Location: [facility];  Service: ENT;  Laterality: N/A;  . UPPER GI ENDOSCOPY      Family History  Problem Relation Age of Onset  . Anesthesia problems Mother     woke up during surgery  . Parkinson's disease Paternal Grandfather   . Migraines Neg Hx     Social history:  reports that she quit smoking about 20 months ago. Her smoking use included Cigarettes. She has a 2.00 pack-year smoking history. She  has never used smokeless tobacco. She reports that she drinks about 0.6 - 1.2 oz of alcohol per week . She reports that she does not use drugs.  Medications:  Prior to Admission medications   Medication Sig Start Date End Date Taking? Authorizing Provider  Albuterol Sulfate 108 (90 BASE) MCG/ACT AEPB Inhale into the lungs as needed.    Yes Historical Provider, MD  dexlansoprazole (DEXILANT) 60 MG capsule Take 1 capsule by mouth  daily 04/15/16  Yes Historical Provider, MD    diclofenac (VOLTAREN) 75 MG EC tablet Take 75 mg by mouth 2 (two) times daily.   Yes Historical Provider, MD  levonorgestrel (MIRENA) 20 MCG/24HR IUD 1 each by Intrauterine route once.   Yes Historical Provider, MD  Multiple Vitamins-Minerals (MULTIVITAMIN GUMMIES ADULT PO) Take by mouth daily.   Yes Historical Provider, MD  RABEprazole (ACIPHEX) 20 MG tablet Take 20 mg by mouth. 05/21/16  Yes Historical Provider, MD  rizatriptan (MAXALT) 10 MG tablet Take 10 mg by mouth as needed for migraine. May repeat in 2 hours if needed   Yes Historical Provider, MD  sucralfate (CARAFATE) 1 g tablet Take 1 g by mouth.   Yes Historical Provider, MD  topiramate (TOPAMAX) 25 MG tablet Take one tablet at night for one week, then take 2 tablets at night for one week, then take 3 tablets at night. 09/23/16   York Spanielharles K Willis, MD      Allergies  Allergen Reactions  . Sulfa Antibiotics Nausea And Vomiting  . Percocet [Oxycodone-Acetaminophen] Nausea And Vomiting    ROS:  Out of a complete 14 system review of symptoms, the patient complains only of the following symptoms, and all other reviewed systems are negative.  Blurred vision Headache Decreased energy Sleepiness  Blood pressure 108/69, pulse 84, height 5\' 4"  (1.626 m), weight 172 lb (78 kg).  Physical Exam  General: The patient is alert and cooperative at the time of the examination.  Eyes: Pupils are equal, round, and reactive to light. Discs are flat bilaterally.  Neck: The neck is supple, no carotid bruits are noted.  Respiratory: The respiratory examination is clear.  Cardiovascular: The cardiovascular examination reveals a regular rate and rhythm, no obvious murmurs or rubs are noted.  Neuromuscular: Range of movement of the cervical spine is full.  Skin: Extremities are without significant edema.  Neurologic Exam  Mental status: The patient is alert and oriented x 3 at the time of the examination. The patient has apparent normal  recent and remote memory, with an apparently normal attention span and concentration ability.  Cranial nerves: Facial symmetry is present. There is good sensation of the face to pinprick and soft touch bilaterally. The strength of the facial muscles and the muscles to head turning and shoulder shrug are normal bilaterally. Speech is well enunciated, no aphasia or dysarthria is noted. Extraocular movements are full. Visual fields are full. The tongue is midline, and the patient has symmetric elevation of the soft palate. No obvious hearing deficits are noted.  Motor: The motor testing reveals 5 over 5 strength of all 4 extremities. Good symmetric motor tone is noted throughout.  Sensory: Sensory testing is intact to pinprick, soft touch, vibration sensation, and position sense on all 4 extremities. No evidence of extinction is noted.  Coordination: Cerebellar testing reveals good finger-nose-finger and heel-to-shin bilaterally.  Gait and station: Gait is normal. Tandem gait is normal. Romberg is negative. No drift is seen.  Reflexes: Deep tendon reflexes are symmetric and  normal bilaterally. Toes are downgoing bilaterally.   Assessment/Plan:  1. History of migraine  2. Chronic daily headache  The patient is having a different type of headache than what is usual for her typical migraine. The patient still may be having migraine headache with converted migraine. The patient will be placed on Topamax, gradually working up on the dose. The patient has Maxalt to take, but this is not effective for recurrent headache. She is also on diclofenac 75 mg daily. She will follow-up in 2-3 months, she will call for any dose adjustments of the Topamax. If the headaches do not abate, MRI evaluation of the brain will be done.  Marlan Palau MD 09/23/2016 3:39 PM  [facility] [address] [place], [place] [address]  Phone [phone] Fax [phone]

## 2016-10-08 ENCOUNTER — Telehealth: Payer: Self-pay | Admitting: Neurology

## 2016-10-08 NOTE — Telephone Encounter (Signed)
Called patient back. She stated last night she had the worst headache. Sunday was the start of a bad serious of headaches. Her pain is located at a "6/10" currently. Last night it was worse than this.  She has not tried taking anything because previous times she got nauseous from taking too much medication. She stated "I have a lot in my medicine cabinet that I can take from previous surgeries".   She verified that she started 3 tablets at bedtime last night of topamax.  She states she "blacked out" last night because of pain. She was playing with her dog and blacked out on her bed.   I educated patient that she should make sure to drink plenty of fluids and eat a well balanced diet. She states she does not normally drink water, drinks coffee. Drinks at least a bottle of water before she goes to bed now though. She drinks about a cup of coffee a day. I recommended she try to increase fluid in take throughout the day because this can worsen headaches. She verbalized understanding.   Advised I will send message to Dr Anne HahnWillis.

## 2016-10-08 NOTE — Telephone Encounter (Signed)
Patient called in reference to topiramate (TOPAMAX) 25 MG tablet.  Patient states she has no side effects, but headaches have gotten worse.  Please call

## 2016-10-08 NOTE — Telephone Encounter (Signed)
I called patient. The headaches are continuing, the patient is now just starting 75 mg at night. She indicates that she is tolerating the medication, if she does well for another week we will go up to 100 mg and higher if needed.  If the headaches are persistent, she will call the office tomorrow, may need a short course of steroids.

## 2016-10-13 MED ORDER — TOPIRAMATE 100 MG PO TABS: 100.0000 mg | ORAL_TABLET | Freq: Every day | ORAL | 3 refills | Status: DC

## 2016-10-13 MED ORDER — PREDNISONE 5 MG PO TABS: ORAL_TABLET | ORAL | 0 refills | Status: DC

## 2016-10-13 NOTE — Telephone Encounter (Signed)
Patient called office returning call in reference to headaches.  Patient still having headches over weekend patient took a hot bath to help with headache, but patient passed out while in bath (tub was draining).  Please call

## 2016-10-13 NOTE — Telephone Encounter (Signed)
I called the patient. She continues to have severe headaches. The patient had a near syncopal episode over the weekend when she got into a very hot of of water.  The patient is now on 100 mg daily of the Topamax. I will call in a prescription for the 100 mg tablets. The patient will be given a prescription for prednisone Dosepak, 5 mg 6 day. The patient may require a Depacon injection in the office if the headaches continue.

## 2016-10-13 NOTE — Telephone Encounter (Signed)
Dr Willis- please advise 

## 2016-10-13 NOTE — Addendum Note (Signed)
Addended by: Stephanie AcreWILLIS, CHARLES on: 10/13/2016 05:46 PM   Modules accepted: Orders

## 2016-10-20 NOTE — Telephone Encounter (Signed)
Pt called said she has finished prednisone, it did help a "little bit". She has increased topamax to 125mg  at bedtime but she is experiencing pins and needles in her feel. She said it actually started when taking topamax 100mg  but it became more prominent at 125mg .York Spaniel. Said she is to increase it again tonight but she is concerned about the side effects.

## 2016-10-20 NOTE — Telephone Encounter (Signed)
Dr Willis- please advise 

## 2016-10-20 NOTE — Telephone Encounter (Signed)
I called the patient. The patient is having paresthesias on the Topamax at the 125 mg dose, eventually may go 250 mg at night, but she may need to wait several weeks to let the level of symptoms improve. The paresthesias are not a reason to stop the medication.

## 2016-10-27 ENCOUNTER — Encounter: Payer: Self-pay | Admitting: Neurology

## 2016-10-27 ENCOUNTER — Other Ambulatory Visit: Payer: Self-pay | Admitting: Neurology

## 2016-10-27 DIAGNOSIS — G4489 Other headache syndrome: Secondary | ICD-10-CM

## 2016-10-27 MED ORDER — GABAPENTIN 300 MG PO CAPS: 300.0000 mg | ORAL_CAPSULE | Freq: Two times a day (BID) | ORAL | 2 refills | Status: DC

## 2016-11-05 ENCOUNTER — Ambulatory Visit (INDEPENDENT_AMBULATORY_CARE_PROVIDER_SITE_OTHER): Payer: 59

## 2016-11-05 DIAGNOSIS — G4489 Other headache syndrome: Secondary | ICD-10-CM

## 2016-11-06 ENCOUNTER — Encounter: Payer: Self-pay | Admitting: Neurology

## 2016-11-07 ENCOUNTER — Telehealth: Payer: Self-pay | Admitting: Neurology

## 2016-11-07 NOTE — Telephone Encounter (Signed)
I called the patient. MRI of the brain show non-specific white matter changes, C/W prior history of migraine.   MRI brain 11/06/16:  IMPRESSION:  This MRI of the brain without contrast shows the following: 1.    Few T2/FLAIR hyperintense foci in the subcortical and deep white matter of the frontal lobes.  This is a nonspecific finding and could represent mild chronic microvascular ischemic change or be the sequelae of migraine, prior trauma or prior infection/inflammation 2.    There are no acute findings.

## 2016-11-17 ENCOUNTER — Telehealth: Payer: Self-pay | Admitting: Neurology

## 2016-11-17 ENCOUNTER — Encounter: Payer: Self-pay | Admitting: Neurology

## 2016-11-17 MED ORDER — PROPRANOLOL HCL 20 MG PO TABS: ORAL_TABLET | ORAL | 3 refills | Status: DC

## 2016-11-17 NOTE — Telephone Encounter (Signed)
So far, the patient has been tried on amitriptyline, Topamax, and gabapentin. It does not appear that she can tolerate the gabapentin, we may need to taper her off this medication, try propranolol, if she does not do well with this medication, we may be able to see if Botox can be used at that time.  She is to call me if she is okay with starting propranolol.

## 2016-11-17 NOTE — Telephone Encounter (Signed)
I called patient. She is not able to tolerate the gabapentin, she will taper off of the medication and we will start propranolol, she will continue the Topamax for now.

## 2016-11-17 NOTE — Addendum Note (Signed)
Addended by: Stephanie AcreWILLIS, Maylani Embree on: 11/17/2016 05:25 PM   Modules accepted: Orders

## 2016-11-17 NOTE — Telephone Encounter (Signed)
Patient returning Dr. Anne HahnWillis' call.  Patient states she is willing to try propranolol, but would like to know if she is to continue taking the topamax.  Please call

## 2016-12-17 ENCOUNTER — Encounter: Payer: Self-pay | Admitting: Neurology

## 2016-12-18 ENCOUNTER — Telehealth: Payer: Self-pay | Admitting: Neurology

## 2016-12-18 MED ORDER — VENLAFAXINE HCL ER 37.5 MG PO CP24: ORAL_CAPSULE | ORAL | 2 refills | Status: DC

## 2016-12-18 NOTE — Telephone Encounter (Signed)
The patient contacted me by my chart indicating that she has stopped the Topamax, this was causing nausea, propranolol was causing a skin rash. We will try Effexor, trying to work up on the dose. In the past she has been on amitriptyline.

## 2016-12-23 ENCOUNTER — Encounter: Payer: Self-pay | Admitting: Adult Health

## 2016-12-23 ENCOUNTER — Ambulatory Visit (INDEPENDENT_AMBULATORY_CARE_PROVIDER_SITE_OTHER): Payer: 59 | Admitting: Adult Health

## 2016-12-23 VITALS — BP 115/74 | HR 78 | Resp 14 | Ht 64.0 in | Wt 167.5 lb

## 2016-12-23 DIAGNOSIS — R51 Headache: Secondary | ICD-10-CM | POA: Diagnosis not present

## 2016-12-23 DIAGNOSIS — R519 Headache, unspecified: Secondary | ICD-10-CM

## 2016-12-23 DIAGNOSIS — G43019 Migraine without aura, intractable, without status migrainosus: Secondary | ICD-10-CM | POA: Diagnosis not present

## 2016-12-23 NOTE — Progress Notes (Signed)
I have read the note, and I agree with the clinical assessment and plan.  Janet Gallegos   

## 2016-12-23 NOTE — Progress Notes (Signed)
PATIENT: Janet Gallegos DOB: [birth year]  REASON FOR VISIT: follow up- I gr headaches HISTORY FROM: patient  HISTORY OF PRESENT ILLNESS: Janet Gallegos is a 36 year old female with a history of migraine headaches. She returns today for follow-up. She was recently placed on Effexor and has been taking it for approximately 5 days now. In the past she has been on several other medications including gabapentin, Topamax propranolol, diclofenac, Maxalt and amitriptyline. She states that she essentially has a daily headache however some days are more severe than others. She reports that her last severe headache was several weeks ago. Her headaches tend to be her on the right side with photophobia and blurred vision. She states that the Effexor has made her "hyper" however she does plan to continue taking this. This effect subsides. She returns today for an evaluation.  HISTORY 09/23/16: Janet Gallegos is a 36 year old right-handed white female with a history of migraine headaches since age 19. Her usual headaches are associated with right or left-sided discomfort, she may have a visual aura prior to the headache associated with "trailers" on moving objects, and then colored lights. The patient generally will have a good response to Maxalt with the headaches. The patient had severe headaches when she will was in her early teenage years, but the headaches have markedly improved until just recently. The patient began having headaches again in August 2017, and they have been daily since October 2017. The headaches are in the back of the head with sharp pain that projects to the right periorbital area. The headache is associated with blurring of vision, and may be quite severe at times. The patient denies any neck stiffness. She has no nausea or vomiting, but she has chronic daily low grade queasiness and she may take Zofran for this. The patient has not had any dizziness, or numbness or weakness of the extremities.  She does report some difficulty with concentration during the headache. She may have photophobia, but no phonophobia with the headache. She denies any allergy symptoms or sinus drainage. She is sent to this office for an evaluation. There is no family history of migraine. The only medication she has been treated on a daily basis in the past for the headaches has been amitriptyline.  REVIEW OF SYSTEMS: Out of a complete 14 system review of symptoms, the patient complains only of the following symptoms, and all other reviewed systems  are negative.  Headache  ALLERGIES: Allergies  Allergen Reactions  . Sulfa Antibiotics Nausea And Vomiting  . Gabapentin     Stomach upset  . Percocet [Oxycodone-Acetaminophen] Nausea And Vomiting  . Propranolol     Rash  . Topamax [Topiramate]     Nausea    HOME MEDICATIONS: Outpatient Medications Prior to Visit  Medication Sig Dispense Refill  . Albuterol Sulfate 108 (90 BASE) MCG/ACT AEPB Inhale into the lungs as needed.     Marland Kitchen dexlansoprazole (DEXILANT) 60 MG capsule Take 1 capsule by mouth  daily    . levonorgestrel (MIRENA) 20 MCG/24HR IUD 1 each by Intrauterine route once.    . Multiple Vitamins-Minerals (MULTIVITAMIN GUMMIES ADULT PO) Take by mouth daily.    . RABEprazole (ACIPHEX) 20 MG tablet Take 20 mg by mouth.    . rizatriptan (MAXALT) 10 MG tablet Take 10 mg by mouth as needed for migraine. May repeat in 2 hours if needed    . sucralfate (CARAFATE) 1 g tablet Take 1 g by mouth.    . venlafaxine XR (  EFFEXOR XR) 37.5 MG 24 hr capsule Take one tablet at night for one week, then take 2 tablets at night for one week, then take 3 tablets at night. 90 capsule 2  . diclofenac (VOLTAREN) 75 MG EC tablet Take 75 mg by mouth 2 (two) times daily.    . predniSONE (DELTASONE) 5 MG tablet Begin taking 6 tablets daily, taper by one tablet daily until off the medication. (Patient not taking: Reported on 12/23/2016) 21 tablet 0   No facility-administered  medications prior to visit.     PAST MEDICAL HISTORY: Past Medical History:  Diagnosis Date  . Acid reflux   . Asthma    prn inhaler  . 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 Common migraine with intractable migraine 09/23/2016  . Complication of anesthesia    states is hard to wake up post-op  . Dental bridge present upper  . Dental crown present    lower  . Dumping syndrome   . Endometriosis   . Family history of adverse reaction to anesthesia    states pt's mother woke up during a surgery  . GERD (gastroesophageal reflux disease)    no current med.  . Migraines     PAST SURGICAL HISTORY: Past Surgical History:  Procedure Laterality Date  . CHONDROPLASTY Right 11/10/2014   Procedure: CHONDROPLASTY;  Surgeon: Marcene Corning, MD;  Location: [facility];  Service: Orthopedics;  Laterality: Right;  . DIAGNOSTIC LAPAROSCOPY     Prior to 2010 x 2  . KNEE ARTHROSCOPY W/ LATERAL RELEASE Right 07/20/2008  . KNEE ARTHROSCOPY WITH FULKERSON SLIDE Right 11/10/2014   Procedure: RIGHT KNEE ARTHROSCOPY WITH Conan Bowens;  Surgeon: Marcene Corning, MD;  Location: [facility];  Service: Orthopedics;  Laterality: Right;  . KNEE ARTHROSCOPY WITH LATERAL RELEASE Right 11/10/2014   Procedure: KNEE ARTHROSCOPY WITH LATERAL RELEASE;  Surgeon: Marcene Corning, MD;  Location:  SURGERY CENTER;  Service: Orthopedics;  Laterality: Right;  . TONSILLECTOMY N/A 07/31/2015   Procedure: TONSILLECTOMY;  Surgeon: Geanie Logan, MD;  Location: [facility];  Service: ENT;  Laterality: N/A;  . UPPER GI ENDOSCOPY      FAMILY HISTORY: Family History  Problem Relation Age of Onset  . Anesthesia problems Mother     woke up during surgery  . Parkinson's disease Paternal Grandfather   . Migraines Neg Hx     SOCIAL HISTORY: Social History   Social History  . Marital status: Single    Spouse name: N/A  . Number of children: 0  . Years of education: Master's     Occupational History  . [employer]    Social History Main Topics  . Smoking status: Former Smoker    Packs/day: 0.50    Years: 4.00    Types: Cigarettes    Quit date: 01/11/2015  . Smokeless tobacco: Never Used     Comment:    . Alcohol use 0.6 - 1.2 oz/week    1 - 2 Standard drinks or equivalent per week     Comment: occasionally  . Drug use: No  . Sexual activity: Not on file   Other Topics Concern  . Not on file   Social History Narrative   Lives at home w/ her brother   Right-handed   Caffeine: 0-4 cups coffee per day      PHYSICAL EXAM  Vitals:   12/23/16 1448  BP: 115/74  Pulse: 78  Resp: 14  Weight: 167 lb 8 oz (76 kg)  Height:  (1.626 m)   Body mass index is 28.75 kg/m.  Generalized: Well developed, in no acute distress   Neurological examination  Mentation: Alert oriented to time, place, history taking. Follows all commands speech and language fluent Cranial nerve II-XII: Pupils were equal round reactive to light. Extraocular movements were full, visual field were full on confrontational test. Facial sensation and strength were normal. Uvula tongue midline. Head turning and shoulder shrug  were normal and symmetric. Motor: The motor testing reveals 5 over 5 strength of all 4 extremities. Good symmetric motor tone is noted throughout.  Sensory: Sensory testing is intact to soft touch on all 4 extremities. No evidence of extinction is noted.  Coordination: Cerebellar testing reveals good finger-nose-finger and heel-to-shin bilaterally.  Gait and station: Gait is normal. Tandem gait is normal. Romberg is negative. No drift is seen.  Reflexes: Deep tendon reflexes are symmetric and normal bilaterally.   DIAGNOSTIC DATA (LABS, IMAGING, TESTING) - I reviewed patient records, labs, notes, testing and imaging myself where available.  Lab Results  Component Value Date   HGB 12.8 11/10/2014      ASSESSMENT AND PLAN 36 y.o. year old female  has a past  medical history of Acid reflux; Asthma; Chondromalacia of right patella (10/2014); Common migraine with intractable migraine (09/23/2016); Complication of anesthesia; Dental bridge present (upper); Dental crown present; Dumping syndrome; Endometriosis; Family history of adverse reaction to anesthesia; GERD (gastroesophageal reflux disease); and Migraines. here with :  1. Daily headache 2. Migraine headache  Patient will continue on Effexor working up to 3 tablets daily. She is tried and failed multiple medications. Patient is advised that if her headache frequency or severity does not improve she should let us know. She will follow-up in 6 months or sooner if needed.     Butch Penny, MSN, NP-C 12/23/2016, 3:04 PM [facility] [address] [place], [place] [address] [phone]

## 2016-12-23 NOTE — Patient Instructions (Signed)
Continue Effexor If your symptoms worsen or you develop new symptoms please let us know.

## 2017-02-05 ENCOUNTER — Other Ambulatory Visit: Payer: Self-pay | Admitting: Neurology

## 2017-02-05 ENCOUNTER — Encounter: Payer: Self-pay | Admitting: Neurology

## 2017-02-05 MED ORDER — VENLAFAXINE HCL ER 150 MG PO CP24: 150.0000 mg | ORAL_CAPSULE | Freq: Every day | ORAL | 3 refills | Status: DC

## 2017-05-10 ENCOUNTER — Encounter: Payer: Self-pay | Admitting: Neurology

## 2017-05-12 ENCOUNTER — Ambulatory Visit (INDEPENDENT_AMBULATORY_CARE_PROVIDER_SITE_OTHER): Payer: 59 | Admitting: Obstetrics and Gynecology

## 2017-05-12 ENCOUNTER — Encounter: Payer: Self-pay | Admitting: Obstetrics and Gynecology

## 2017-05-12 VITALS — BP 112/76 | HR 80 | Ht 64.0 in | Wt 183.0 lb

## 2017-05-12 DIAGNOSIS — Z01419 Encounter for gynecological examination (general) (routine) without abnormal findings: Secondary | ICD-10-CM | POA: Diagnosis not present

## 2017-05-12 NOTE — Patient Instructions (Signed)
Preventive Care 18-39 Years, Female Preventive care refers to lifestyle choices and visits with your health care provider that can promote health and wellness. What does preventive care include?  A yearly physical exam. This is also called an annual well check.  Dental exams once or twice a year.  Routine eye exams. Ask your health care provider how often you should have your eyes checked.  Personal lifestyle choices, including: ? Daily care of your teeth and gums. ? Regular physical activity. ? Eating a healthy diet. ? Avoiding tobacco and drug use. ? Limiting alcohol use. ? Practicing safe sex. ? Taking vitamin and mineral supplements as recommended by your health care provider. What happens during an annual well check? The services and screenings done by your health care provider during your annual well check will depend on your age, overall health, lifestyle risk factors, and family history of disease. Counseling Your health care provider may ask you questions about your:  Alcohol use.  Tobacco use.  Drug use.  Emotional well-being.  Home and relationship well-being.  Sexual activity.  Eating habits.  Work and work Statistician.  Method of birth control.  Menstrual cycle.  Pregnancy history.  Screening You may have the following tests or measurements:  Height, weight, and BMI.  Diabetes screening. This is done by checking your blood sugar (glucose) after you have not eaten for a while (fasting).  Blood pressure.  Lipid and cholesterol levels. These may be checked every 5 years starting at age 80.  Skin check.  Hepatitis C blood test.  Hepatitis B blood test.  Sexually transmitted disease (STD) testing.  BRCA-related cancer screening. This may be done if you have a family history of breast, ovarian, tubal, or peritoneal cancers.  Pelvic exam and Pap test. This may be done every 3 years starting at age 63. Starting at age 57, this may be done every 5  years if you have a Pap test in combination with an HPV test.  Discuss your test results, treatment options, and if necessary, the need for more tests with your health care provider. Vaccines Your health care provider may recommend certain vaccines, such as:  Influenza vaccine. This is recommended every year.  Tetanus, diphtheria, and acellular pertussis (Tdap, Td) vaccine. You may need a Td booster every 10 years.  Varicella vaccine. You may need this if you have not been vaccinated.  HPV vaccine. If you are 74 or younger, you may need three doses over 6 months.  Measles, mumps, and rubella (MMR) vaccine. You may need at least one dose of MMR. You may also need a second dose.  Pneumococcal 13-valent conjugate (PCV13) vaccine. You may need this if you have certain conditions and were not previously vaccinated.  Pneumococcal polysaccharide (PPSV23) vaccine. You may need one or two doses if you smoke cigarettes or if you have certain conditions.  Meningococcal vaccine. One dose is recommended if you are age 30-21 years and a first-year college student living in a residence hall, or if you have one of several medical conditions. You may also need additional booster doses.  Hepatitis A vaccine. You may need this if you have certain conditions or if you travel or work in places where you may be exposed to hepatitis A.  Hepatitis B vaccine. You may need this if you have certain conditions or if you travel or work in places where you may be exposed to hepatitis B.  Haemophilus influenzae type b (Hib) vaccine. You may need this if  you have certain risk factors.  Talk to your health care provider about which screenings and vaccines you need and how often you need them. This information is not intended to replace advice given to you by your health care provider. Make sure you discuss any questions you have with your health care provider. Document Released: 10/14/2001 Document Revised: 05/07/2016  Document Reviewed: 06/19/2015 Elsevier Interactive Patient Education  2017 [employer].

## 2017-05-12 NOTE — Progress Notes (Signed)
Patient ID: Janet Gallegos, female   DOB: 1980/12/20, 36 y.o.   MRN: [id number]     Gynecology Annual Exam  PCP: Ronal FearLam, Lynn E, NP  Chief Complaint:  Chief Complaint  Patient presents with  . Gynecologic Exam    History of Present Illness: Patient is a 36 y.o. No obstetric history on file. presents for annual exam. The patient has no complaints today.   LMP: Patient's last menstrual period was 04/24/2017. Irregular secondary to Mirena IUD  The patient is sexually active. She currently uses none for contraception. She denies dyspareunia.  The patient does perform self breast exams.  There is no notable family history of breast or ovarian cancer in her family.  The patient wears seatbelts: yes.   The patient has regular exercise: not asked.    The patient denies current symptoms of depression.    Review of Systems: Review of Systems  Constitutional: Negative for chills and fever.  HENT: Negative for congestion.   Respiratory: Negative for cough and shortness of breath.   Cardiovascular: Negative for chest pain and palpitations.  Gastrointestinal: Negative for abdominal pain, constipation, diarrhea, heartburn, nausea and vomiting.  Genitourinary: Negative for dysuria, frequency and urgency.  Skin: Negative for itching and rash.  Neurological: Negative for dizziness and headaches.  Endo/Heme/Allergies: Negative for polydipsia.  Psychiatric/Behavioral: Negative for depression.    Past Medical History:  Past Medical History:  Diagnosis Date  . Acid reflux   . Asthma    prn inhaler  . 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 Common migraine with intractable migraine 09/23/2016  . Complication of anesthesia    states is hard to wake up post-op  . Dental bridge present upper  . Dental crown present    lower  . Dumping syndrome   . Endometriosis   . Family history of adverse reaction to anesthesia    states pt's mother woke up during a surgery  . GERD (gastroesophageal  reflux disease)    no current med.  Marland Kitchen. Migraines     Past Surgical History:  Past Surgical History:  Procedure Laterality Date  . CHONDROPLASTY Right 11/10/2014   Procedure: CHONDROPLASTY;  Surgeon: Marcene CorningPeter Dalldorf, MD;  Location: [facility];  Service: Orthopedics;  Laterality: Right;  . DIAGNOSTIC LAPAROSCOPY     Prior to 2010 x 2  . KNEE ARTHROSCOPY W/ LATERAL RELEASE Right 07/20/2008  . KNEE ARTHROSCOPY WITH FULKERSON SLIDE Right 11/10/2014   Procedure: RIGHT KNEE ARTHROSCOPY WITH Conan BowensFULKERSON SLIDE;  Surgeon: Marcene CorningPeter Dalldorf, MD;  Location: [facility];  Service: Orthopedics;  Laterality: Right;  . KNEE ARTHROSCOPY WITH LATERAL RELEASE Right 11/10/2014   Procedure: KNEE ARTHROSCOPY WITH LATERAL RELEASE;  Surgeon: Marcene CorningPeter Dalldorf, MD;  Location: [facility];  Service: Orthopedics;  Laterality: Right;  . TONSILLECTOMY N/A 07/31/2015   Procedure: TONSILLECTOMY;  Surgeon: Geanie LoganPaul Bennett, MD;  [facility];  Service: ENT;  Laterality: N/A;  . UPPER GI ENDOSCOPY      Gynecologic History:  Patient's last menstrual period was 04/24/2017. Contraception:06/17/13 Mirena  IUD Last Pap: Results were: 02/29/2016 NIL and HR HPV negative   Obstetric History: No obstetric history on file.  Family History:  Family History  Problem Relation Age of Onset  . Anesthesia problems Mother        woke up during surgery  . Parkinson's disease Paternal Grandfather   . Migraines Neg Hx     Social History:  Social History   Social History  .  Marital status: Single    Spouse name: N/A  . Number of children: 0  . Years of education: Master's   Occupational History  . [employer]    Social History Main Topics  . Smoking status: Former Smoker    Packs/day: 0.50    Years: 4.00    Types: Cigarettes    Quit date: 01/11/2015  . Smokeless tobacco: Never Used     Comment:    . Alcohol use 0.6 - 1.2 oz/week    1 - 2 Standard drinks or equivalent per  week     Comment: occasionally  . Drug use: No  . Sexual activity: Not on file   Other Topics Concern  . Not on file   Social History Narrative   Lives at home w/ her brother   Right-handed   Caffeine: 0-4 cups coffee per day    Allergies:  Allergies  Allergen Reactions  . Sulfa Antibiotics Nausea And Vomiting  . Gabapentin     Stomach upset  . Percocet [Oxycodone-Acetaminophen] Nausea And Vomiting  . Propranolol     Rash  . Topamax [Topiramate]     Nausea    Medications: Prior to Admission medications   Medication Sig Start Date End Date Taking? Authorizing Provider  Albuterol Sulfate 108 (90 BASE) MCG/ACT AEPB Inhale into the lungs as needed.    Yes [provider]  dexlansoprazole (DEXILANT) 60 MG capsule Take 1 capsule by mouth  daily 04/15/16  Yes [provider]  levonorgestrel (MIRENA) 20 MCG/24HR IUD 1 each by Intrauterine route once.   Yes [provider]  Multiple Vitamins-Minerals (MULTIVITAMIN GUMMIES ADULT PO) Take by mouth daily.   Yes [provider]  predniSONE (DELTASONE) 5 MG tablet Begin taking 6 tablets daily, taper by one tablet daily until off the medication. 10/13/16  Yes York Spaniel, MD  RABEprazole (ACIPHEX) 20 MG tablet Take 20 mg by mouth. 05/21/16  Yes [provider]  rizatriptan (MAXALT) 10 MG tablet Take 10 mg by mouth as needed for migraine. May repeat in 2 hours if needed   Yes [provider]  sucralfate (CARAFATE) 1 g tablet Take 1 g by mouth.   Yes [provider]  venlafaxine XR (EFFEXOR XR) 150 MG 24 hr capsule Take 1 capsule (150 mg total) by mouth daily with breakfast. 02/05/17  Yes York Spaniel, MD    Physical Exam Vitals: Blood pressure 112/76, pulse 80, height  (1.626 m), weight 183 lb (83 kg), last menstrual period 04/24/2017.  General: NAD HEENT: normocephalic, anicteric Thyroid: no enlargement, no palpable nodules Pulmonary: No increased work of  breathing, CTAB Cardiovascular: RRR, distal pulses 2+ Breast: Breast symmetrical, no tenderness, no palpable nodules or masses, no skin or nipple retraction present, no nipple discharge.  No axillary or supraclavicular lymphadenopathy. Abdomen: NABS, soft, non-tender, non-distended.  Umbilicus without lesions.  No hepatomegaly, splenomegaly or masses palpable. No evidence of hernia  Genitourinary:  External: Normal external female genitalia.  Normal urethral meatus, normal  Bartholin's and Skene's glands.    Vagina: Normal vaginal mucosa, no evidence of prolapse.    Cervix: Grossly normal in appearance, no bleeding  Uterus: Non-enlarged, mobile, normal contour.  No CMT  Adnexa: ovaries non-enlarged, no adnexal masses  Rectal: deferred  Lymphatic: no evidence of inguinal lymphadenopathy Extremities: no edema, erythema, or tenderness Neurologic: Grossly intact Psychiatric: mood appropriate, affect full  Female chaperone present for pelvic and breast  portions of the physical exam    Assessment:  36 y.o. No obstetric history on file. routine annual exam  Plan: Problem List Items Addressed This Visit    None    Visit Diagnoses    Encounter for gynecological examination without abnormal finding    -  Primary      1) STI screening was offered and declined  2) ASCCP guidelines and rational discussed.  Patient opts for every 3 years screening interval  3) Contraception - continue IUD  4) Routine healthcare maintenance including cholesterol, diabetes screening discussed managed by PCP  5) Follow up 1 year for routine annual exam

## 2017-05-22 ENCOUNTER — Telehealth: Payer: Self-pay

## 2017-05-22 NOTE — Telephone Encounter (Signed)
Pt came in and signed the botox consent form. Placed on Danielle's desk. Pt understands that Duwayne Heck will contact her with scheduling the botox appt and for insurance concerns.

## 2017-05-25 NOTE — Telephone Encounter (Signed)
I called and scheduled the patient.  °

## 2017-05-25 NOTE — Telephone Encounter (Signed)
Pt has returned the call to [facility] and is now asking for a call back please

## 2017-05-25 NOTE — Telephone Encounter (Signed)
I called to scheduled the patient and discuss the injections, she did not answer so I left a VM asking her to call me back.

## 2017-06-10 ENCOUNTER — Encounter: Payer: Self-pay | Admitting: Neurology

## 2017-06-10 ENCOUNTER — Ambulatory Visit (INDEPENDENT_AMBULATORY_CARE_PROVIDER_SITE_OTHER): Payer: 59 | Admitting: Neurology

## 2017-06-10 VITALS — BP 110/73 | HR 76

## 2017-06-10 DIAGNOSIS — G43019 Migraine without aura, intractable, without status migrainosus: Secondary | ICD-10-CM | POA: Diagnosis not present

## 2017-06-10 NOTE — Procedures (Signed)
     BOTOX PROCEDURE NOTE FOR MIGRAINE HEADACHE   HISTORY: Janet Gallegos is a 36 year old patient with a history of intractable migraines, the patient is having over 20 headache days a month. Her headaches have not responded to multiple medications, she comes in for her first Botox injection therapy today.   Description of procedure:  The patient was placed in a sitting position. The standard protocol was used for Botox as follows, with 5 units of Botox injected at each site:   -Procerus muscle, midline injection  -Corrugator muscle, bilateral injection  -Frontalis muscle, bilateral injection, with 2 sites each side, medial injection was performed in the upper one third of the frontalis muscle, in the region vertical from the medial inferior edge of the superior orbital rim. The lateral injection was again in the upper one third of the forehead vertically above the lateral limbus of the cornea, 1.5 cm lateral to the medial injection site.  -Temporalis muscle injection, 4 sites, bilaterally. The first injection was 3 cm above the tragus of the ear, second injection site was 1.5 cm to 3 cm up from the first injection site in line with the tragus of the ear. The third injection site was 1.5-3 cm forward between the first 2 injection sites. The fourth injection site was 1.5 cm posterior to the second injection site.  -Occipitalis muscle injection, 3 sites, bilaterally. The first injection was done one half way between the occipital protuberance and the tip of the mastoid process behind the ear. The second injection site was done lateral and superior to the first, 1 fingerbreadth from the first injection. The third injection site was 1 fingerbreadth superiorly and medially from the first injection site.  -Cervical paraspinal muscle injection, 2 sites, bilateral, the first injection site was 1 cm from the midline of the cervical spine, 3 cm inferior to the lower border of the occipital  protuberance. The second injection site was 1.5 cm superiorly and laterally to the first injection site.  -Trapezius muscle injection was performed at 3 sites, bilaterally. The first injection site was in the upper trapezius muscle halfway between the inflection point of the neck, and the acromion. The second injection site was one half way between the acromion and the first injection site. The third injection was done between the first injection site and the inflection point of the neck.   A 200 unit bottle of Botox was used, 155 units were injected, the rest of the Botox was wasted. The patient tolerated the procedure well, there were no complications of the above procedure.  Botox NDC [phone] Lot number See nursing note Expiration date See nursing note

## 2017-06-10 NOTE — Progress Notes (Signed)
Botox-100unitsx2 vials Lot: [id number] Expiration: 12/2019 NDC: 1478-2956-21 [id number]  0.9% Sodium Chloride-27mL total Lot: [id number] Expiration: 10/02/2018 NDC: 4132-4401-02  Dx: V25.366 Specialty pharmacy

## 2017-06-10 NOTE — Progress Notes (Signed)
Please refer to Botox procedure note. 

## 2017-06-24 ENCOUNTER — Ambulatory Visit (INDEPENDENT_AMBULATORY_CARE_PROVIDER_SITE_OTHER): Payer: 59 | Admitting: Adult Health

## 2017-06-24 ENCOUNTER — Encounter: Payer: Self-pay | Admitting: Adult Health

## 2017-06-24 VITALS — BP 119/73 | HR 76 | Ht 64.0 in

## 2017-06-24 DIAGNOSIS — G43011 Migraine without aura, intractable, with status migrainosus: Secondary | ICD-10-CM | POA: Diagnosis not present

## 2017-06-24 MED ORDER — PREDNISONE 5 MG PO TABS: ORAL_TABLET | ORAL | 0 refills | Status: DC

## 2017-06-24 MED ORDER — RIZATRIPTAN BENZOATE 10 MG PO TABS: ORAL_TABLET | ORAL | 11 refills | Status: DC

## 2017-06-24 NOTE — Patient Instructions (Signed)
Your Plan:  Continue with Botox Prednisone Dosepak for current headache Continue maxalt  To prevent or relieve headaches, try the following: Cool Compress. Lie down and place a cool compress on your head.  Avoid headache triggers. If certain foods or odors seem to have triggered your migraines in the past, avoid them. A headache diary might help you identify triggers.  Include physical activity in your daily routine. Try a daily walk or other moderate aerobic exercise.  Manage stress. Find healthy ways to cope with the stressors, such as delegating tasks on your to-do list.  Practice relaxation techniques. Try deep breathing, yoga, massage and visualization.  Eat regularly. Eating regularly scheduled meals and maintaining a healthy diet might help prevent headaches. Also, drink plenty of fluids.  Follow a regular sleep schedule. Sleep deprivation might contribute to headaches Consider biofeedback. With this mind-body technique, you learn to control certain bodily functions - such as muscle tension, heart rate and blood pressure - to prevent headaches or reduce headache pain.    Proceed to emergency room if you experience new or worsening symptoms or symptoms do not resolve, if you have new neurologic symptoms or if headache is severe, or for any concerning symptom.  Thank you for coming to see us at [facility]. I hope we have been able to provide you high quality care today.  You may receive a patient satisfaction survey over the next few weeks. We would appreciate your feedback and comments so that we may continue to improve ourselves and the health of our patients.

## 2017-06-24 NOTE — Progress Notes (Signed)
PATIENT: Janet Gallegos DOB: 12/12/1980  REASON FOR VISIT: follow up HISTORY FROM: patient  HISTORY OF PRESENT ILLNESS: Today 06/24/17 Janet Gallegos  is a 36 year old female with a history of migraine headaches. She returns today for follow-up. She just received her first Botox injections on October 10. She states that she continues to have daily mild headaches. She developed a severe headache this morning. She states that it occurs primarily on the right side. She does have photophobia as well as blurry vision primarily on the right side. She is not on a daily medication anymore. Reports that she stopped Effexor as she did not find it beneficial. She continues to use Maxalt although she has not taken it today. She returns today for an evaluation.  HISTORY 12/23/16: Janet Gallegos is a 36 year old female with a history of migraine headaches. She returns today for follow-up. She was recently placed on Effexor and has been taking it for approximately 5 days now. In the past she has been on several other medications including gabapentin, Topamax propranolol, diclofenac, Maxalt and amitriptyline. She states that she essentially has a daily headache however some days are more severe than others. She reports that her last severe headache was several weeks ago. Her headaches tend to be her on the right side with photophobia and blurred vision. She states that the Effexor has made her "hyper" however she does plan to continue taking this. This effect subsides. She returns today for an evaluation.   REVIEW OF SYSTEMS: Out of a complete 14 system review of symptoms, the patient complains only of the following symptoms, and all other reviewed systems are negative.  See history of present illness  ALLERGIES: Allergies  Allergen Reactions  . Sulfa Antibiotics Nausea And Vomiting  . Gabapentin     Stomach upset  . Percocet [Oxycodone-Acetaminophen] Nausea And Vomiting  . Propranolol     Rash  . Topamax  [Topiramate]     Nausea    HOME MEDICATIONS: Outpatient Medications Prior to Visit  Medication Sig Dispense Refill  . Albuterol Sulfate 108 (90 BASE) MCG/ACT AEPB Inhale into the lungs as needed.     Marland Kitchen dexlansoprazole (DEXILANT) 60 MG capsule Take 1 capsule by mouth  daily    . levonorgestrel (MIRENA) 20 MCG/24HR IUD 1 each by Intrauterine route once.    . Multiple Vitamins-Minerals (MULTIVITAMIN GUMMIES ADULT PO) Take by mouth daily.    . RABEprazole (ACIPHEX) 20 MG tablet Take 20 mg by mouth.    . rizatriptan (MAXALT) 10 MG tablet Take 10 mg by mouth as needed for migraine. May repeat in 2 hours if needed    . sucralfate (CARAFATE) 1 g tablet Take 1 g by mouth.    . venlafaxine XR (EFFEXOR XR) 150 MG 24 hr capsule Take 1 capsule (150 mg total) by mouth daily with breakfast. 30 capsule 3   No facility-administered medications prior to visit.     PAST MEDICAL HISTORY: Past Medical History:  Diagnosis Date  . Acid reflux   . Asthma    prn inhaler  . 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 Common migraine with intractable migraine 09/23/2016  . Complication of anesthesia    states is hard to wake up post-op  . Dental bridge present upper  . Dental crown present    lower  . Dumping syndrome   . Endometriosis   . Family history of adverse reaction to anesthesia    states pt's mother woke up during  a surgery  . GERD (gastroesophageal reflux disease)    no current med.  . Migraines     PAST SURGICAL HISTORY: Past Surgical History:  Procedure Laterality Date  . CHONDROPLASTY Right 11/10/2014   Procedure: CHONDROPLASTY;  Surgeon: Marcene Corning, MD;  Location: [facility];  Service: Orthopedics;  Laterality: Right;  . DIAGNOSTIC LAPAROSCOPY     Prior to 2010 x 2  . KNEE ARTHROSCOPY W/ LATERAL RELEASE Right 07/20/2008  . KNEE ARTHROSCOPY WITH FULKERSON SLIDE Right 11/10/2014   Procedure: RIGHT KNEE ARTHROSCOPY WITH Conan Bowens;  Surgeon: Marcene Corning, MD;   Location: [facility];  Service: Orthopedics;  Laterality: Right;  . KNEE ARTHROSCOPY WITH LATERAL RELEASE Right 11/10/2014   Procedure: KNEE ARTHROSCOPY WITH LATERAL RELEASE;  Surgeon: Marcene Corning, MD;  Location: [facility];  Service: Orthopedics;  Laterality: Right;  . TONSILLECTOMY N/A 07/31/2015   Procedure: TONSILLECTOMY;  Surgeon: Geanie Logan, MD;  Location: [facility];  Service: ENT;  Laterality: N/A;  . UPPER GI ENDOSCOPY      FAMILY HISTORY: Family History  Problem Relation Age of Onset  . Anesthesia problems Mother        woke up during surgery  . Parkinson's disease Paternal Grandfather   . Migraines Neg Hx     SOCIAL HISTORY: Social History   Social History  . Marital status: Single    Spouse name: N/A  . Number of children: 0  . Years of education: Master's   Occupational History  . [employer]    Social History Main Topics  . Smoking status: Former Smoker    Packs/day: 0.50    Years: 4.00    Types: Cigarettes    Quit date: 01/11/2015  . Smokeless tobacco: Never Used     Comment:    . Alcohol use 0.6 - 1.2 oz/week    1 - 2 Standard drinks or equivalent per week     Comment: occasionally  . Drug use: No  . Sexual activity: Not on file   Other Topics Concern  . Not on file   Social History Narrative   Lives at home w/ her brother   Right-handed   Caffeine: 0-4 cups coffee per day      PHYSICAL EXAM  Vitals:   06/24/17 1458  BP: 119/73  Pulse: 76  Height: 5\' 4"  (1.626 m)   There is no height or weight on file to calculate BMI.  Generalized: Well developed, in no acute distress   Neurological examination  Mentation: Alert oriented to time, place, history taking. Follows all commands speech and language fluent Cranial nerve II-XII: Pupils were equal round reactive to light. Extraocular movements were full, visual field were full on confrontational test. Facial sensation and strength were normal. Uvula  tongue midline. Head turning and shoulder shrug  were normal and symmetric. Motor: The motor testing reveals 5 over 5 strength of all 4 extremities. Good symmetric motor tone is noted throughout.  Sensory: Sensory testing is intact to soft touch on all 4 extremities. No evidence of extinction is noted.  Coordination: Cerebellar testing reveals good finger-nose-finger and heel-to-shin bilaterally.  Gait and station: Gait is normal. Tandem gait is normal. Romberg is negative. No drift is seen.  Reflexes: Deep tendon reflexes are symmetric and normal bilaterally.   DIAGNOSTIC DATA (LABS, IMAGING, TESTING) - I reviewed patient records, labs, notes, testing and imaging myself where available.     ASSESSMENT AND PLAN 37 y.o. year old female  has a past medical history of Acid reflux; Asthma; Chondromalacia of right patella (10/2014); Common migraine with intractable migraine (09/23/2016); Complication of anesthesia; Dental bridge present (upper); Dental crown present; Dumping syndrome; Endometriosis; Family history of adverse reaction to anesthesia; GERD (gastroesophageal reflux disease); and Migraines. here with :  1. Migraine headache  The patient has a headache today. She has not yet taken Maxalt. I did advise that she should take Maxalt at the onset of a migraine in order for it to be beneficial. She voiced understanding. She has had a prednisone Dosepak in the past with good benefit. I will order this again today. She is advised that she should commit to at least 2 more Botox injection cycles. After those if  she has not gained significant benefit we can consider other medications such as Aimovig or Ajovy. She returns today for follow-up.    Butch PennyMegan Mccall Lomax, MSN, NP-C 06/24/2017, 3:17 PM [facility] [address], [address] [place], [place] [address] [phone]

## 2017-06-24 NOTE — Progress Notes (Signed)
I have read the note, and I agree with the clinical assessment and plan.  Bernal Luhman KEITH   

## 2017-07-17 ENCOUNTER — Encounter: Payer: Self-pay | Admitting: Adult Health

## 2017-07-20 ENCOUNTER — Telehealth: Payer: Self-pay | Admitting: Adult Health

## 2017-07-20 NOTE — Telephone Encounter (Signed)
error 

## 2017-07-20 NOTE — Telephone Encounter (Signed)
Receive call from patient. This RN inquired if she had listened to VM; she stated she did. This RN inquired if she has ever received Depacon infusion; patient denied. This RN inquired if any provider has ever told her she had liver issues, liver disease; patient denied being told. This RN advised that she may have infusion tomorrow of Depacon per Dolores HooseM Millikan, NP. This RN will discuss with Royetta Crochetina RN and call her with appt time. Patient verbalzied understanding, appreciation.  Per Inetta Fermoina RN patient may have infusion at 12 pm tomorrow; called patient and advised she arrive 11:45 am for infusion. She verbalized understanding, appreciation.

## 2017-07-20 NOTE — Telephone Encounter (Signed)
LVM on home phone advising patient that because she had steroids in Oct, Megan will not prescribe again this soon. Inquired if she has ever had Depacon infusion.  Advised her that she may be able to get an infusion tomorrow. Requested she call back to discuss.

## 2017-08-18 ENCOUNTER — Encounter: Payer: Self-pay | Admitting: Adult Health

## 2017-08-19 MED ORDER — GABAPENTIN 100 MG PO CAPS: ORAL_CAPSULE | ORAL | 5 refills | Status: DC

## 2017-08-19 NOTE — Telephone Encounter (Signed)
I called the patient.  She reports that she is essentially having a headache daily.  He reports last week she had a severe headache.  She states that she is taking  more than 15 tablets of Maxalt daily.  She states that in the past she felt that gabapentin caused stomach upset but it was actually Topamax.  She would like to retry gabapentin.  I am amenable to this.  She will start on 100 mg at bedtime for 1 week then increase to 200 mg at bedtime for 1 week then 300 mg at bedtime thereafter.  If she is unable to tolerate this she will let me know.  We discussed overuse of Maxalt as well.  Patient verbalized understanding.  She will follow-up in January for her second round of Botox injections.

## 2017-08-28 ENCOUNTER — Encounter: Payer: Self-pay | Admitting: Adult Health

## 2017-09-08 ENCOUNTER — Telehealth: Payer: Self-pay | Admitting: Adult Health

## 2017-09-08 NOTE — Telephone Encounter (Signed)
[facility] [phone] called set up botox for delivery on 09/10/16

## 2017-09-08 NOTE — Telephone Encounter (Signed)
Noted, thank you

## 2017-09-16 ENCOUNTER — Ambulatory Visit: Payer: Managed Care, Other (non HMO) | Admitting: Neurology

## 2017-09-24 ENCOUNTER — Encounter: Payer: Self-pay | Admitting: Neurology

## 2017-09-24 ENCOUNTER — Ambulatory Visit: Payer: Managed Care, Other (non HMO) | Admitting: Neurology

## 2017-09-24 VITALS — BP 121/73 | HR 92 | Ht 64.0 in | Wt 181.8 lb

## 2017-09-24 DIAGNOSIS — G43019 Migraine without aura, intractable, without status migrainosus: Secondary | ICD-10-CM

## 2017-09-24 MED ORDER — GABAPENTIN 300 MG PO CAPS: 300.0000 mg | ORAL_CAPSULE | Freq: Two times a day (BID) | ORAL | 1 refills | Status: DC

## 2017-09-24 NOTE — Procedures (Signed)
     BOTOX PROCEDURE NOTE FOR MIGRAINE HEADACHE   HISTORY: Janet Gallegos is a 37 year old patient with a history of intractable migraine headaches.  The patient comes in for her second Botox injection.  Following the first injection, the patient has had an occasional headache free day which is unusual for her.  She has noted that the headache severity has reduced, but she is still having 1 or 2 prolonged severe headaches a month that may last up to 5 days.  Depacon injections have been helpful as have steroid therapies.  The patient comes in for a second treatment.  She believes that there has been a definite positive response to the first injection.   Description of procedure:  The patient was placed in a sitting position. The standard protocol was used for Botox as follows, with 5 units of Botox injected at each site:   -Procerus muscle, midline injection  -Corrugator muscle, bilateral injection  -Frontalis muscle, bilateral injection, with 2 sites each side, medial injection was performed in the upper one third of the frontalis muscle, in the region vertical from the medial inferior edge of the superior orbital rim. The lateral injection was again in the upper one third of the forehead vertically above the lateral limbus of the cornea, 1.5 cm lateral to the medial injection site.  -Temporalis muscle injection, 4 sites, bilaterally. The first injection was 3 cm above the tragus of the ear, second injection site was 1.5 cm to 3 cm up from the first injection site in line with the tragus of the ear. The third injection site was 1.5-3 cm forward between the first 2 injection sites. The fourth injection site was 1.5 cm posterior to the second injection site.  -Occipitalis muscle injection, 3 sites, bilaterally. The first injection was done one half way between the occipital protuberance and the tip of the mastoid process behind the ear. The second injection site was done lateral and superior to  the first, 1 fingerbreadth from the first injection. The third injection site was 1 fingerbreadth superiorly and medially from the first injection site.  -Cervical paraspinal muscle injection, 2 sites, bilateral, the first injection site was 1 cm from the midline of the cervical spine, 3 cm inferior to the lower border of the occipital protuberance. The second injection site was 1.5 cm superiorly and laterally to the first injection site.  -Trapezius muscle injection was performed at 3 sites, bilaterally. The first injection site was in the upper trapezius muscle halfway between the inflection point of the neck, and the acromion. The second injection site was one half way between the acromion and the first injection site. The third injection was done between the first injection site and the inflection point of the neck.   A 200 unit bottle of Botox was used, 155 units were injected, the rest of the Botox was wasted. The patient tolerated the procedure well, there were no complications of the above procedure.  Botox NDC [id number] Lot number [id number] Expiration date May 2021

## 2017-09-24 NOTE — Progress Notes (Signed)
The patient comes in today for her second Botox injection.  She is back on gabapentin, she currently is taking 200 mg in the morning and 300 mg in the evening.  She is tolerating this well.  A prescription for gabapentin 300 mg capsules was sent in taking 1 twice daily.  He will call for any dose adjustments in this regard.

## 2017-10-28 ENCOUNTER — Encounter: Payer: Self-pay | Admitting: Adult Health

## 2017-10-29 NOTE — Telephone Encounter (Signed)
Pt to come in for the depacon infusion 500mg  IV x1 and may repeat x 1 if needed.  Orders given to Mindy in intrafusion.

## 2017-11-12 ENCOUNTER — Telehealth: Payer: Self-pay | Admitting: Neurology

## 2017-11-12 MED ORDER — GABAPENTIN 300 MG PO CAPS: 300.0000 mg | ORAL_CAPSULE | Freq: Three times a day (TID) | ORAL | 3 refills | Status: DC

## 2017-11-12 NOTE — Telephone Encounter (Signed)
Spoke to pt and she is needing her prescription to go to [facility] [place], [place] on [address].  Done.

## 2017-11-12 NOTE — Telephone Encounter (Signed)
Pt needs refill for gabapentin (NEURONTIN) 300 MG capsule with new directions and qty [facility]  [place] (just for this medication) She is taking 1am, 1midday and 1 bedtime.

## 2017-12-07 ENCOUNTER — Telehealth: Payer: Self-pay | Admitting: Adult Health

## 2017-12-07 NOTE — Telephone Encounter (Signed)
Pt requesting a call to discuss getting an infusion. Stating she has had a migraine for the past few days

## 2017-12-07 NOTE — Telephone Encounter (Signed)
Gave orders to Mindy in intrafusion for pt.  I relayed that pt to arrive at 1200 for infusion, if this changes I will let pt know.  (tina will be here tomorrow, earlier for pt would be better).

## 2017-12-07 NOTE — Telephone Encounter (Signed)
Spoke to pt and she will come in anytime tomorrow as today [place] is in [place].  She states she has been suffering with migraine since Wednesday when she woke up meds maxalt, sleep, massage has not helped.  She has been  It is progressively getting worse. Has has had depacon IV previously and that has worked well.  She will be coming alone. Appt for tomorrow with intrafusion pending order.

## 2017-12-07 NOTE — Telephone Encounter (Signed)
Order given Depakote IV 500 mg x 1 can repeat if no improvement in headache

## 2017-12-08 NOTE — Telephone Encounter (Signed)
Spoke to [place] and pt is coming in at [employer].  I spoke to pt and she had spoken to someone (in intrafusion yesterday and is on her way in to be here at 0830).

## 2017-12-10 ENCOUNTER — Telehealth: Payer: Self-pay | Admitting: Neurology

## 2017-12-10 NOTE — Telephone Encounter (Signed)
PA form submitted to [employer].   Patient;s [facility] has been made aware that the PA is pending.

## 2017-12-16 NOTE — Telephone Encounter (Signed)
Botox injections have been approved [id number], [id number] (12/11/18).   I called [facility] and provided the PA information.

## 2017-12-22 NOTE — Telephone Encounter (Signed)
I called to check status of the patients botox. The representative told me there was not a PA on file. I informed her I called and provided the PA on 04/17 she transferred me to the PA line [phone].   I spoke with a representative in the [place] department who stated that they could not accept that PA because it "didn't look like their normal PA's". I informed her that they are the filling pharmacy on this patients plan. She asked for insurance information and I provided the patients new [employer] policy. I was holding 40 minutes, the representative came back and said they cannot fill this patients medication through the medical benefits and it must fill through pharmacy benefits. I will need to obtain another authorization through [facility] benefits. She transferred me to the department.   I spoke with a representative in the PA department and submitted a PA request. It was approved through (03/23/18) PA number is [id number].

## 2017-12-23 ENCOUNTER — Encounter: Payer: Self-pay | Admitting: Adult Health

## 2017-12-23 ENCOUNTER — Ambulatory Visit: Payer: 59 | Admitting: Adult Health

## 2017-12-23 VITALS — BP 106/72 | HR 74 | Ht 64.0 in | Wt 184.0 lb

## 2017-12-23 DIAGNOSIS — G43009 Migraine without aura, not intractable, without status migrainosus: Secondary | ICD-10-CM | POA: Diagnosis not present

## 2017-12-23 NOTE — Progress Notes (Signed)
PATIENT: Janet Gallegos DOB: 1981/05/23  REASON FOR VISIT: follow up HISTORY FROM: patient  HISTORY OF PRESENT ILLNESS: Today 12/23/17 Ms. Minchey is a 37 year old female with a history of migraine headaches.  She returns today for follow-up.  She states after her Botox injections in January she noticed a significant improvement in her headache.  Reports that she was able to take a trip to [place] and did not have a migraine while she was on vacation.  She states in the last 3 to 4 weeks her headache frequency has increased.  She contributes most of her headaches to whether changes.  She is no longer using the Maxalt.  She continues on gabapentin 300 mg 3 times a day.  She states that she tends to skip the lunchtime dose and take 600 mg at bedtime instead.  She does note that gabapentin makes her drowsy throughout the day.  She is unsure how much gabapentin is actually helping her headaches at this time.  She returns today for an evaluation.  HISTORY 06/24/17 Ms. Watkinson  is a 37 year old female with a history of migraine headaches. She returns today for follow-up. She just received her first Botox injections on October 10. She states that she continues to have daily mild headaches. She developed a severe headache this morning. She states that it occurs primarily on the right side. She does have photophobia as well as blurry vision primarily on the right side. She is not on a daily medication anymore. Reports that she stopped Effexor as she did not find it beneficial. She continues to use Maxalt although she has not taken it today. She returns today for an evaluation.    REVIEW OF SYSTEMS: Out of a complete 14 system review of symptoms, the patient complains only of the following symptoms, and all other reviewed systems are negative.  Headache, daytime sleepiness, neck stiffness  ALLERGIES: Allergies  Allergen Reactions  . Sulfa Antibiotics Nausea And Vomiting  . Percocet  [Oxycodone-Acetaminophen] Nausea And Vomiting  . Propranolol     Rash  . Topamax [Topiramate]     Nausea    HOME MEDICATIONS: Outpatient Medications Prior to Visit  Medication Sig Dispense Refill  . Albuterol Sulfate 108 (90 BASE) MCG/ACT AEPB Inhale into the lungs as needed.     . Cholecalciferol (VITAMIN D3 PO) Take 2,000 Units by mouth daily.    Marland Kitchen dexlansoprazole (DEXILANT) 60 MG capsule Take 1 capsule by mouth  daily as needed    . gabapentin (NEURONTIN) 300 MG capsule Take 1 capsule (300 mg total) by mouth 3 (three) times daily. 270 capsule 3  . levonorgestrel (MIRENA) 20 MCG/24HR IUD 1 each by Intrauterine route once.    . Melatonin 10 MG SUBL Place 10 mg under the tongue at bedtime.    . Multiple Vitamins-Minerals (AIRBORNE GUMMIES) CHEW Chew 1 each by mouth daily.    . Multiple Vitamins-Minerals (MULTIVITAMIN GUMMIES ADULT PO) Take 2 each by mouth daily.     . RABEprazole (ACIPHEX) 20 MG tablet Take 20 mg by mouth 2 (two) times daily.     . rizatriptan (MAXALT) 10 MG tablet Take 1 tablet at the onset of migraine. May repeat in 2 hours if needed (Patient not taking: Reported on 12/23/2017) 10 tablet 11  . predniSONE (DELTASONE) 5 MG tablet Begin taking 6 tablets daily, taper by one tablet daily until off the medication. (Patient not taking: Reported on 12/23/2017) 21 tablet 0  . sucralfate (CARAFATE) 1 g  tablet Take 1 g by mouth.     No facility-administered medications prior to visit.     PAST MEDICAL HISTORY: Past Medical History:  Diagnosis Date  . Acid reflux   . Asthma    prn inhaler  . 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 Common migraine with intractable migraine 09/23/2016  . Complication of anesthesia    states is hard to wake up post-op  . Dental bridge present upper  . Dental crown present    lower  . Dumping syndrome   . Endometriosis   . Family history of adverse reaction to anesthesia    states pt's mother woke up during a surgery  . GERD  (gastroesophageal reflux disease)    no current med.  . Migraines     PAST SURGICAL HISTORY: Past Surgical History:  Procedure Laterality Date  . CHONDROPLASTY Right 11/10/2014   Procedure: CHONDROPLASTY;  Surgeon: Marcene Corning, MD;  Location: [facility];  Service: Orthopedics;  Laterality: Right;  . DIAGNOSTIC LAPAROSCOPY     Prior to 2010 x 2  . KNEE ARTHROSCOPY W/ LATERAL RELEASE Right 07/20/2008  . KNEE ARTHROSCOPY WITH FULKERSON SLIDE Right 11/10/2014   Procedure: RIGHT KNEE ARTHROSCOPY WITH Conan Bowens;  Surgeon: Marcene Corning, MD;  Location: [facility];  Service: Orthopedics;  Laterality: Right;  . KNEE ARTHROSCOPY WITH LATERAL RELEASE Right 11/10/2014   Procedure: KNEE ARTHROSCOPY WITH LATERAL RELEASE;  Surgeon: Marcene Corning, MD;  Location: [facility];  Service: Orthopedics;  Laterality: Right;  . TONSILLECTOMY N/A 07/31/2015   Procedure: TONSILLECTOMY;  Surgeon: Geanie Logan, MD;  Location: [facility];  Service: ENT;  Laterality: N/A;  . UPPER GI ENDOSCOPY      FAMILY HISTORY: Family History  Problem Relation Age of Onset  . Anesthesia problems Mother        woke up during surgery  . Parkinson's disease Paternal Grandfather   . Migraines Neg Hx     SOCIAL HISTORY: Social History   Socioeconomic History  . Marital status: Single    Spouse name: Not on file  . Number of children: 0  . Years of education: Master's  . Highest education level: Not on file  Occupational History  . Occupation: [employer]  Social Needs  . Financial resource strain: Not on file  . Food insecurity:    Worry: Not on file    Inability: Not on file  . Transportation needs:    Medical: Not on file    Non-medical: Not on file  Tobacco Use  . Smoking status: Former Smoker    Packs/day: 0.50    Years: 4.00    Pack years: 2.00    Types: Cigarettes    Last attempt to quit: 01/11/2015    Years since quitting: 2.9  . Smokeless  tobacco: Never Used  . Tobacco comment:    Substance and Sexual Activity  . Alcohol use: Yes    Alcohol/week: 0.6 - 1.2 oz    Types: 1 - 2 Standard drinks or equivalent per week    Comment: occasionally  . Drug use: No  . Sexual activity: Not on file  Lifestyle  . Physical activity:    Days per week: Not on file    Minutes per session: Not on file  . Stress: Not on file  Relationships  . Social connections:    Talks on phone: Not on file    Gets together: Not on file    Attends religious service:  Not on file    Active member of club or organization: Not on file    Attends meetings of clubs or organizations: Not on file    Relationship status: Not on file  . Intimate partner violence:    Fear of current or ex partner: Not on file    Emotionally abused: Not on file    Physically abused: Not on file    Forced sexual activity: Not on file  Other Topics Concern  . Not on file  Social History Narrative   Lives at home w/ her brother   Right-handed   Caffeine: 0-4 cups coffee per day      PHYSICAL EXAM  Vitals:   12/23/17 1440  BP: 106/72  Pulse: 74  Weight: 184 lb (83.5 kg)  Height: 5\' 4"  (1.626 m)   Body mass index is 31.58 kg/m.  Generalized: Well developed, in no acute distress   Neurological examination  Mentation: Alert oriented to time, place, history taking. Follows all commands speech and language fluent Cranial nerve II-XII: Pupils were equal round reactive to light. Extraocular movements were full, visual field were full on confrontational test. Facial sensation and strength were normal. Uvula tongue midline. Head turning and shoulder shrug  were normal and symmetric. Motor: The motor testing reveals 5 over 5 strength of all 4 extremities. Good symmetric motor tone is noted throughout.  Sensory: Sensory testing is intact to soft touch on all 4 extremities. No evidence of extinction is noted.  Coordination: Cerebellar testing reveals good finger-nose-finger  and heel-to-shin bilaterally.  Gait and station: Gait is normal. Tandem gait is normal. Romberg is negative. No drift is seen.  Reflexes: Deep tendon reflexes are symmetric and normal bilaterally.   DIAGNOSTIC DATA (LABS, IMAGING, TESTING) - I reviewed patient records, labs, notes, testing and imaging myself where available.     ASSESSMENT AND PLAN 37 y.o. year old female  has a past medical history of Acid reflux, Asthma, Chondromalacia of right patella (10/2014), Common migraine with intractable migraine (09/23/2016), Complication of anesthesia, Dental bridge present (upper), Dental crown present, Dumping syndrome, Endometriosis, Family history of adverse reaction to anesthesia, GERD (gastroesophageal reflux disease), and Migraines. here with:  1.  Migraine headache  Overall the patient has done well after her second round of Botox injections.  She has her next injections next week.  I advised the patient that 2 weeks after her Botox injection she could try eliminating the morning dose of gabapentin to see if this helps with her daytime sleepiness.  If her headaches worsen she should let us know.  She will follow-up in 6 months or sooner if needed.   I spent 15 minutes with the patient. 50% of this time was spent discussing her medications.   Butch PennyMegan [place], MSN, NP-C 12/23/2017, 2:55 PM [facility] [address], [address] [place], [place] [address] [phone]

## 2017-12-23 NOTE — Progress Notes (Signed)
I have read the note, and I agree with the clinical assessment and plan.  Venisha Boehning K Michelyn Scullin   

## 2017-12-23 NOTE — Patient Instructions (Signed)
Your Plan:  Continue gabapentin. 2 weeks after your botox injections eliminate the morning dose of gabapentin if your headaches worsen let me know If your symptoms worsen or you develop new symptoms please let us know.    Thank you for coming to see [facility] at [facility]. I hope we have been able to provide you high quality care today.  You may receive a patient satisfaction survey over the next few weeks. We would appreciate your feedback and comments so that we may continue to improve ourselves and the health of our patients.

## 2017-12-29 ENCOUNTER — Encounter: Payer: Self-pay | Admitting: Neurology

## 2017-12-29 ENCOUNTER — Ambulatory Visit (INDEPENDENT_AMBULATORY_CARE_PROVIDER_SITE_OTHER): Payer: Managed Care, Other (non HMO) | Admitting: Neurology

## 2017-12-29 VITALS — BP 115/58 | HR 80

## 2017-12-29 DIAGNOSIS — G43019 Migraine without aura, intractable, without status migrainosus: Secondary | ICD-10-CM | POA: Diagnosis not present

## 2017-12-29 NOTE — Progress Notes (Signed)
Please refer to Botox procedure note. 

## 2017-12-29 NOTE — Procedures (Signed)
     BOTOX PROCEDURE NOTE FOR MIGRAINE HEADACHE   HISTORY: Janet Gallegos is a 37 year old patient with a history of intractable migraine headaches.  She comes in for her third Botox injection.  The patient has had an excellent benefit with her prior injection, she had only a rare headache following the injection, and headache was not severe.  She has not had to take any Maxalt during that timeframe.  Within the last 2 weeks prior to the next Botox injection her headaches have become more severe, but the Maxalt seems to work better now.  She returns for another Botox therapy.  She has gained excellent benefit with the treatments.   Description of procedure:  The patient was placed in a sitting position. The standard protocol was used for Botox as follows, with 5 units of Botox injected at each site:   -Procerus muscle, midline injection  -Corrugator muscle, bilateral injection  -Frontalis muscle, bilateral injection, with 2 sites each side, medial injection was performed in the upper one third of the frontalis muscle, in the region vertical from the medial inferior edge of the superior orbital rim. The lateral injection was again in the upper one third of the forehead vertically above the lateral limbus of the cornea, 1.5 cm lateral to the medial injection site.  -Temporalis muscle injection, 4 sites, bilaterally. The first injection was 3 cm above the tragus of the ear, second injection site was 1.5 cm to 3 cm up from the first injection site in line with the tragus of the ear. The third injection site was 1.5-3 cm forward between the first 2 injection sites. The fourth injection site was 1.5 cm posterior to the second injection site.  -Occipitalis muscle injection, 3 sites, bilaterally. The first injection was done one half way between the occipital protuberance and the tip of the mastoid process behind the ear. The second injection site was done lateral and superior to the first, 1  fingerbreadth from the first injection. The third injection site was 1 fingerbreadth superiorly and medially from the first injection site.  -Cervical paraspinal muscle injection, 2 sites, bilateral, the first injection site was 1 cm from the midline of the cervical spine, 3 cm inferior to the lower border of the occipital protuberance. The second injection site was 1.5 cm superiorly and laterally to the first injection site.  -Trapezius muscle injection was performed at 3 sites, bilaterally. The first injection site was in the upper trapezius muscle halfway between the inflection point of the neck, and the acromion. The second injection site was one half way between the acromion and the first injection site. The third injection was done between the first injection site and the inflection point of the neck.   A 200 unit bottle of Botox was used, 155 units were injected, the rest of the Botox was wasted. The patient tolerated the procedure well, there were no complications of the above procedure.  Botox NDC 1610-9604-54 Lot number [id number] Expiration date September 2021

## 2018-01-18 ENCOUNTER — Telehealth: Payer: Self-pay | Admitting: Adult Health

## 2018-01-18 MED ORDER — PREDNISONE 5 MG PO TABS: ORAL_TABLET | ORAL | 0 refills | Status: DC

## 2018-01-18 NOTE — Telephone Encounter (Signed)
Pt called stating she is needing to come in for an infusion. Stating she has had a migraine since last Wednesday 5/15 has been on and off. Medications haven't help please call to advise

## 2018-01-18 NOTE — Telephone Encounter (Signed)
Pt has called in to inform that she would very much like to have the prednisone dose pack called into  [facility] [place], [place] - [address] [phone] (Phone) [phone] (Fax)

## 2018-01-18 NOTE — Telephone Encounter (Signed)
Spoke with patient who stated her migraine was coming and going until yesterday. She stated she has cut back Gabapentin to taking 600 mg nightly only but still has daytime sleepiness. Her last Botox was 12/29/17. She stated maxalt didn't help , and she's not tried any OTC medications. She said today her migraine "is bad and affecting her sight on her left side". She is at work today.   This RN stated will check with infusion RN for availability and discuss with Megan.  She verbalized understanding, appreciation.

## 2018-01-18 NOTE — Telephone Encounter (Addendum)
Called [facility] to cancel prednisone Rx accidentally e scribed there, per NP.  LVM advising pharmacist to discontinue Rx e scribed today by Dolores Hoose, NP, for prednisone dose pack. Repeated entire message.

## 2018-01-18 NOTE — Telephone Encounter (Signed)
Order sent.

## 2018-01-18 NOTE — Addendum Note (Signed)
Addended by: Enedina Finner on: 01/18/2018 04:57 PM   Modules accepted: Orders

## 2018-01-18 NOTE — Telephone Encounter (Signed)
Discussed with NP; LVM advising patient that per Aundra Millet she may come in today for an infusion., advised her that Mindy RN, infusion suite stated patient needs to come in right now. Also advised patient that instead of infusion, Aundra Millet will call in prednisone dose pack. Requested she call back to let this RN know.

## 2018-03-31 ENCOUNTER — Encounter: Payer: Self-pay | Admitting: Neurology

## 2018-03-31 ENCOUNTER — Ambulatory Visit (INDEPENDENT_AMBULATORY_CARE_PROVIDER_SITE_OTHER): Payer: Managed Care, Other (non HMO) | Admitting: Neurology

## 2018-03-31 VITALS — BP 110/70 | HR 101

## 2018-03-31 DIAGNOSIS — G43019 Migraine without aura, intractable, without status migrainosus: Secondary | ICD-10-CM | POA: Diagnosis not present

## 2018-03-31 NOTE — Progress Notes (Signed)
Please refer to Botox procedure note. 

## 2018-03-31 NOTE — Procedures (Signed)
     BOTOX PROCEDURE NOTE FOR MIGRAINE HEADACHE   HISTORY: Ronelle NighSavanna Focht is a 37 year old patient with a history of intractable migraine headaches.  She comes in for her fourth Botox therapy.  She has gained excellent benefit with her migraines, she begins to have increased frequency of headaches about 2 weeks prior to the next Botox injection.  She is not missing work because of headache.  She is quite pleased with the therapy.  She has noted that when she does take Maxalt, this is effective for her headache.  Weather changes aggravate her headache.   Description of procedure:  The patient was placed in a sitting position. The standard protocol was used for Botox as follows, with 5 units of Botox injected at each site:   -Procerus muscle, midline injection  -Corrugator muscle, bilateral injection  -Frontalis muscle, bilateral injection, with 2 sites each side, medial injection was performed in the upper one third of the frontalis muscle, in the region vertical from the medial inferior edge of the superior orbital rim. The lateral injection was again in the upper one third of the forehead vertically above the lateral limbus of the cornea, 1.5 cm lateral to the medial injection site.  -Temporalis muscle injection, 4 sites, bilaterally. The first injection was 3 cm above the tragus of the ear, second injection site was 1.5 cm to 3 cm up from the first injection site in line with the tragus of the ear. The third injection site was 1.5-3 cm forward between the first 2 injection sites. The fourth injection site was 1.5 cm posterior to the second injection site.  -Occipitalis muscle injection, 3 sites, bilaterally. The first injection was done one half way between the occipital protuberance and the tip of the mastoid process behind the ear. The second injection site was done lateral and superior to the first, 1 fingerbreadth from the first injection. The third injection site was 1 fingerbreadth  superiorly and medially from the first injection site.  -Cervical paraspinal muscle injection, 2 sites, bilateral, the first injection site was 1 cm from the midline of the cervical spine, 3 cm inferior to the lower border of the occipital protuberance. The second injection site was 1.5 cm superiorly and laterally to the first injection site.  -Trapezius muscle injection was performed at 3 sites, bilaterally. The first injection site was in the upper trapezius muscle halfway between the inflection point of the neck, and the acromion. The second injection site was one half way between the acromion and the first injection site. The third injection was done between the first injection site and the inflection point of the neck.   A 200 unit bottle of Botox was used, 155 units were injected, the rest of the Botox was wasted. The patient tolerated the procedure well, there were no complications of the above procedure.  Botox NDC [id number] Lot number [id number] Expiration date February 2022

## 2018-04-09 ENCOUNTER — Encounter: Payer: Self-pay | Admitting: Adult Health

## 2018-04-12 MED ORDER — PREDNISONE 5 MG PO TABS: ORAL_TABLET | ORAL | 0 refills | Status: DC

## 2018-04-28 ENCOUNTER — Other Ambulatory Visit: Payer: Self-pay | Admitting: Allergy

## 2018-04-28 ENCOUNTER — Ambulatory Visit
Admission: RE | Admit: 2018-04-28 | Discharge: 2018-04-28 | Disposition: A | Discharge status: Home / Self Care | Payer: Managed Care, Other (non HMO) | Source: Ambulatory Visit | Attending: Allergy | Admitting: Allergy

## 2018-04-28 DIAGNOSIS — J4530 Mild persistent asthma, uncomplicated: Secondary | ICD-10-CM

## 2018-06-04 ENCOUNTER — Other Ambulatory Visit: Payer: Self-pay | Admitting: Neurology

## 2018-06-17 ENCOUNTER — Telehealth: Payer: Self-pay | Admitting: Neurology

## 2018-06-17 NOTE — Telephone Encounter (Signed)
Mardelle Matte with [facility] requesting a call at [phone] to discuss pts PA for Botox

## 2018-06-22 ENCOUNTER — Telehealth: Payer: Self-pay | Admitting: Obstetrics and Gynecology

## 2018-06-22 NOTE — Telephone Encounter (Signed)
Noted. Will order to arrive by apt date/time. 

## 2018-06-22 NOTE — Telephone Encounter (Signed)
Patient is schedule 07/08/18 with AMS for mirena removal and reinsertion

## 2018-06-23 ENCOUNTER — Ambulatory Visit: Payer: Managed Care, Other (non HMO) | Admitting: Adult Health

## 2018-06-23 ENCOUNTER — Encounter: Payer: Self-pay | Admitting: Adult Health

## 2018-06-23 VITALS — BP 109/75 | HR 81 | Ht 64.0 in | Wt 190.2 lb

## 2018-06-23 DIAGNOSIS — G43009 Migraine without aura, not intractable, without status migrainosus: Secondary | ICD-10-CM | POA: Diagnosis not present

## 2018-06-23 NOTE — Patient Instructions (Addendum)
Your Plan:  Continue Botox injections Continue Maxalt if needed If your symptoms worsen or you develop new symptoms please let us know.   Thank you for coming to see [place] at [facility]. I hope we have been able to provide you high quality care today.  You may receive a patient satisfaction survey over the next few weeks. We would appreciate your feedback and comments so that we may continue to improve ourselves and the health of our patients.

## 2018-06-23 NOTE — Progress Notes (Signed)
PATIENT: Janet Gallegos DOB: 23-Aug-1981  REASON FOR VISIT: follow up HISTORY FROM: patient  HISTORY OF PRESENT ILLNESS: Today 06/23/18: Janet Gallegos is a 37 year old female with a history of migraine headaches.  She returns today for follow-up.  She reports that Botox injections have been working well for her.  She states that she now only has a headache when it rains.  She states that if she takes Maxalt it does help.  She states that it does make her a little foggy headed and typically she has to take a nap after she takes his medication.  Her headaches always occur on the right side.  She denies light or noise sensitivity.  On occasion she will have nausea and vomiting.  The patient states in the past she has been on Xanax to help with her acute headaches.  She is asking if this can be prescribed again.  She returns today for evaluation.  HISTORY 12/23/17 Janet Gallegos is a 37 year old female with a history of migraine headaches.  She returns today for follow-up.  She states after her Botox injections in January she noticed a significant improvement in her headache.  Reports that she was able to take a trip to [place] and did not have a migraine while she was on vacation.  She states in the last 3 to 4 weeks her headache frequency has increased.  She contributes most of her headaches to whether changes.  She is no longer using the Maxalt.  She continues on gabapentin 300 mg 3 times a day.  She states that she tends to skip the lunchtime dose and take 600 mg at bedtime instead.  She does note that gabapentin makes her drowsy throughout the day.  She is unsure how much gabapentin is actually helping her headaches at this time.  She returns today for an evaluation.  REVIEW OF SYSTEMS: Out of a complete 14 system review of symptoms, the patient complains only of the following symptoms, and all other reviewed systems are negative.  headache  ALLERGIES: Allergies  Allergen Reactions  . Sulfa  Antibiotics Nausea And Vomiting  . Percocet [Oxycodone-Acetaminophen] Nausea And Vomiting  . Propranolol     Rash  . Topamax [Topiramate]     Nausea    HOME MEDICATIONS: Outpatient Medications Prior to Visit  Medication Sig Dispense Refill  . Albuterol Sulfate 108 (90 BASE) MCG/ACT AEPB Inhale into the lungs as needed.     Marland Kitchen BOTOX 100 units SOLR injection INJECT 155 UNITS INTRAMUSCULARLY EVERY 12 WEEKS (GIVEN AT PRESCRIBERS OFFICE, DISCARD UNUSED) 2 vial 0  . budesonide-formoterol (SYMBICORT) 160-4.5 MCG/ACT inhaler Inhale 2 puffs into the lungs 2 (two) times daily.    . cetirizine (ZYRTEC) 10 MG tablet Take 10 mg by mouth daily.    . Cholecalciferol (VITAMIN D3 PO) Take 2,000 Units by mouth daily.    Marland Kitchen dexlansoprazole (DEXILANT) 60 MG capsule Take 1 capsule by mouth  daily as needed    . levonorgestrel (MIRENA) 20 MCG/24HR IUD 1 each by Intrauterine route once.    . Melatonin 10 MG SUBL Place 10 mg under the tongue at bedtime.    . Multiple Vitamins-Minerals (AIRBORNE GUMMIES) CHEW Chew 1 each by mouth daily.    . Multiple Vitamins-Minerals (MULTIVITAMIN GUMMIES ADULT PO) Take 2 each by mouth daily.     . RABEprazole (ACIPHEX) 20 MG tablet Take 20 mg by mouth 2 (two) times daily.     . rizatriptan (MAXALT) 10 MG tablet Take 1  tablet at the onset of migraine. May repeat in 2 hours if needed 10 tablet 11  . spironolactone (ALDACTONE) 25 MG tablet Take 25 mg by mouth 2 (two) times daily.    . predniSONE (DELTASONE) 5 MG tablet Begin taking 6 tablets daily, taper by one tablet daily until off the medication. 21 tablet 0   No facility-administered medications prior to visit.     PAST MEDICAL HISTORY: Past Medical History:  Diagnosis Date  . Acid reflux   . Asthma    prn inhaler  . 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 Common migraine with intractable migraine 09/23/2016  . Complication of anesthesia    states is hard to wake up post-op  . Dental bridge present upper  . Dental  crown present    lower  . Dumping syndrome   . Endometriosis   . Family history of adverse reaction to anesthesia    states pt's mother woke up during a surgery  . GERD (gastroesophageal reflux disease)    no current med.  . Migraines     PAST SURGICAL HISTORY: Past Surgical History:  Procedure Laterality Date  . CHONDROPLASTY Right 11/10/2014   Procedure: CHONDROPLASTY;  Surgeon: Marcene Corning, MD;  Location: [facility];  Service: Orthopedics;  Laterality: Right;  . DIAGNOSTIC LAPAROSCOPY     Prior to 2010 x 2  . KNEE ARTHROSCOPY W/ LATERAL RELEASE Right 07/20/2008  . KNEE ARTHROSCOPY WITH FULKERSON SLIDE Right 11/10/2014   Procedure: RIGHT KNEE ARTHROSCOPY WITH Conan Bowens;  Surgeon: Marcene Corning, MD;  Location: [facility];  Service: Orthopedics;  Laterality: Right;  . KNEE ARTHROSCOPY WITH LATERAL RELEASE Right 11/10/2014   Procedure: KNEE ARTHROSCOPY WITH LATERAL RELEASE;  Surgeon: Marcene Corning, MD;  Location: [facility];  Service: Orthopedics;  Laterality: Right;  . TONSILLECTOMY N/A 07/31/2015   Procedure: TONSILLECTOMY;  Surgeon: Geanie Logan, MD;  Location: [facility];  Service: ENT;  Laterality: N/A;  . UPPER GI ENDOSCOPY      FAMILY HISTORY: Family History  Problem Relation Age of Onset  . Anesthesia problems Mother        woke up during surgery  . Parkinson's disease Paternal Grandfather   . Migraines Neg Hx     SOCIAL HISTORY: Social History   Socioeconomic History  . Marital status: Single    Spouse name: Not on file  . Number of children: 0  . Years of education: Master's  . Highest education level: Not on file  Occupational History  . Occupation: [employer]  Social Needs  . Financial resource strain: Not on file  . Food insecurity:    Worry: Not on file    Inability: Not on file  . Transportation needs:    Medical: Not on file    Non-medical: Not on file  Tobacco Use  . Smoking status:  Former Smoker    Packs/day: 0.50    Years: 4.00    Pack years: 2.00    Types: Cigarettes    Last attempt to quit: 01/11/2015    Years since quitting: 3.4  . Smokeless tobacco: Never Used  . Tobacco comment:    Substance and Sexual Activity  . Alcohol use: Yes    Alcohol/week: 1.0 - 2.0 standard drinks    Types: 1 - 2 Standard drinks or equivalent per week    Comment: occasionally  . Drug use: No  . Sexual activity: Not on file  Lifestyle  . Physical activity:  Days per week: Not on file    Minutes per session: Not on file  . Stress: Not on file  Relationships  . Social connections:    Talks on phone: Not on file    Gets together: Not on file    Attends religious service: Not on file    Active member of club or organization: Not on file    Attends meetings of clubs or organizations: Not on file    Relationship status: Not on file  . Intimate partner violence:    Fear of current or ex partner: Not on file    Emotionally abused: Not on file    Physically abused: Not on file    Forced sexual activity: Not on file  Other Topics Concern  . Not on file  Social History Narrative   Lives at home w/ her brother   Right-handed   Caffeine: 0-4 cups coffee per day      PHYSICAL EXAM  Vitals:   06/23/18 0730  BP: 109/75  Pulse: 81  Weight: 190 lb 3.2 oz (86.3 kg)  Height: 5\' 4"  (1.626 m)   Body mass index is 32.65 kg/m.  Generalized: Well developed, in no acute distress   Neurological examination  Mentation: Alert oriented to time, place, history taking. Follows all commands speech and language fluent Cranial nerve II-XII: Pupils were equal round reactive to light. Extraocular movements were full, visual field were full on confrontational test. Facial sensation and strength were normal. Uvula tongue midline. Head turning and shoulder shrug  were normal and symmetric. Motor: The motor testing reveals 5 over 5 strength of all 4 extremities. Good symmetric motor tone is  noted throughout.  Sensory: Sensory testing is intact to soft touch on all 4 extremities. No evidence of extinction is noted.  Coordination: Cerebellar testing reveals good finger-nose-finger and heel-to-shin bilaterally.  Gait and station: Gait is normal.  Reflexes: Deep tendon reflexes are symmetric and normal bilaterally.   DIAGNOSTIC DATA (LABS, IMAGING, TESTING) - I reviewed patient records, labs, notes, testing and imaging myself where available.  Lab Results  Component Value Date   HGB 12.8 11/10/2014      ASSESSMENT AND PLAN 37 y.o. year old female  has a past medical history of Acid reflux, Asthma, Chondromalacia of right patella (10/2014), Common migraine with intractable migraine (09/23/2016), Complication of anesthesia, Dental bridge present (upper), Dental crown present, Dumping syndrome, Endometriosis, Family history of adverse reaction to anesthesia, GERD (gastroesophageal reflux disease), and Migraines. here with:  1.  Migraine headache  Overall the patient is doing well.  She will continue with Botox injections.  I advised that if her symptoms worsen or she develops new symptoms she should let us know.  She will keep her appointment at the end of the month with Dr. Anne Hahn for Botox.  I spent 15 minutes with the patient. 50% of this time was spent reviewing plan of care   Butch Penny, MSN, NP-C 06/23/2018, 7:36 AM [facility] [address] [place], [place] [address] [phone]

## 2018-06-23 NOTE — Progress Notes (Signed)
I have read the note, and I agree with the clinical assessment and plan.  Charles K Willis   

## 2018-06-28 NOTE — Telephone Encounter (Signed)
I called and completed PA requested. They will fax over results.

## 2018-06-29 ENCOUNTER — Ambulatory Visit: Payer: Managed Care, Other (non HMO) | Admitting: Adult Health

## 2018-06-29 NOTE — Telephone Encounter (Signed)
Fleet Contras @ [facility] has called for Janet Gallegos, she stated Janet Gallegos needs to submit a new PA and it needs to be billed medically, please call [phone]

## 2018-06-30 NOTE — Telephone Encounter (Signed)
Late Entry 06/29/18: I spoke with the pharmacy and they made me aware the medication was ready to ship out, we conferenced the patient and she stated that the copay was high and she wanted to call her insurance to inquire why.   I called her today but she did not answer so I left a VM asking her to call me back.

## 2018-07-01 ENCOUNTER — Ambulatory Visit: Payer: Managed Care, Other (non HMO) | Admitting: Neurology

## 2018-07-01 ENCOUNTER — Other Ambulatory Visit: Payer: Self-pay

## 2018-07-01 ENCOUNTER — Encounter: Payer: Self-pay | Admitting: Neurology

## 2018-07-01 VITALS — BP 113/74 | HR 76 | Resp 16 | Ht 64.0 in | Wt 191.0 lb

## 2018-07-01 DIAGNOSIS — G43019 Migraine without aura, intractable, without status migrainosus: Secondary | ICD-10-CM

## 2018-07-01 NOTE — Procedures (Signed)
     BOTOX PROCEDURE NOTE FOR MIGRAINE HEADACHE   HISTORY: Janet Gallegos is a 37 year old patient with a history of intractable migraine headache.  The patient has had an excellent benefit from the Botox, over the last 3 months, she has not had to take any of her Maxalt.  She is quite pleased with the benefits she has gotten from Botox, she returns for another therapy.   Description of procedure:  The patient was placed in a sitting position. The standard protocol was used for Botox as follows, with 5 units of Botox injected at each site:   -Procerus muscle, midline injection  -Corrugator muscle, bilateral injection  -Frontalis muscle, bilateral injection, with 2 sites each side, medial injection was performed in the upper one third of the frontalis muscle, in the region vertical from the medial inferior edge of the superior orbital rim. The lateral injection was again in the upper one third of the forehead vertically above the lateral limbus of the cornea, 1.5 cm lateral to the medial injection site.  -Temporalis muscle injection, 4 sites, bilaterally. The first injection was 3 cm above the tragus of the ear, second injection site was 1.5 cm to 3 cm up from the first injection site in line with the tragus of the ear. The third injection site was 1.5-3 cm forward between the first 2 injection sites. The fourth injection site was 1.5 cm posterior to the second injection site.  -Occipitalis muscle injection, 3 sites, bilaterally. The first injection was done one half way between the occipital protuberance and the tip of the mastoid process behind the ear. The second injection site was done lateral and superior to the first, 1 fingerbreadth from the first injection. The third injection site was 1 fingerbreadth superiorly and medially from the first injection site.  -Cervical paraspinal muscle injection, 2 sites, bilateral, the first injection site was 1 cm from the midline of the cervical  spine, 3 cm inferior to the lower border of the occipital protuberance. The second injection site was 1.5 cm superiorly and laterally to the first injection site.  -Trapezius muscle injection was performed at 3 sites, bilaterally. The first injection site was in the upper trapezius muscle halfway between the inflection point of the neck, and the acromion. The second injection site was one half way between the acromion and the first injection site. The third injection was done between the first injection site and the inflection point of the neck.   A 200 unit bottle of Botox was used, 155 units were injected, the rest of the Botox was wasted. The patient tolerated the procedure well, there were no complications of the above procedure.  Botox NDC 1610-9604-54 Lot number [id number] Expiration date April 2022

## 2018-07-08 ENCOUNTER — Encounter: Payer: Self-pay | Admitting: Obstetrics and Gynecology

## 2018-07-08 ENCOUNTER — Ambulatory Visit (INDEPENDENT_AMBULATORY_CARE_PROVIDER_SITE_OTHER): Payer: Managed Care, Other (non HMO) | Admitting: Obstetrics and Gynecology

## 2018-07-08 ENCOUNTER — Other Ambulatory Visit (HOSPITAL_COMMUNITY)
Admission: RE | Admit: 2018-07-08 | Discharge: 2018-07-08 | Disposition: A | Discharge status: Home / Self Care | Payer: Managed Care, Other (non HMO) | Source: Ambulatory Visit | Attending: Obstetrics and Gynecology | Admitting: Obstetrics and Gynecology

## 2018-07-08 VITALS — BP 120/80 | HR 82 | Wt 192.0 lb

## 2018-07-08 DIAGNOSIS — Z01419 Encounter for gynecological examination (general) (routine) without abnormal findings: Secondary | ICD-10-CM | POA: Diagnosis not present

## 2018-07-08 DIAGNOSIS — Z124 Encounter for screening for malignant neoplasm of cervix: Secondary | ICD-10-CM

## 2018-07-08 DIAGNOSIS — Z30433 Encounter for removal and reinsertion of intrauterine contraceptive device: Secondary | ICD-10-CM | POA: Diagnosis not present

## 2018-07-08 DIAGNOSIS — Z1239 Encounter for other screening for malignant neoplasm of breast: Secondary | ICD-10-CM

## 2018-07-08 NOTE — Progress Notes (Signed)
Gynecology Annual Exam   PCP: Ronal Fear, NP  Chief Complaint:  Chief Complaint  Patient presents with  . Gynecologic Exam  . Contraception    Mirena removal/reinsert    History of Present Illness: Patient is a 37 y.o. No obstetric history on file. presents for annual exam. The patient has no complaints today.   LMP: Patient's last menstrual period was 05/04/2018 (exact date). Irregular breakthrough bleeding on Mirena  The patient is sexually active. She currently uses IUD for contraception. She denies dyspareunia.  The patient does perform self breast exams.  There is no notable family history of breast or ovarian cancer in her family.  The patient wears seatbelts: yes.   The patient has regular exercise: not asked.    The patient denies current symptoms of depression.    Review of Systems: Review of Systems  Constitutional: Negative for chills and fever.  HENT: Negative for congestion.   Respiratory: Negative for cough and shortness of breath.   Cardiovascular: Negative for chest pain and palpitations.  Gastrointestinal: Negative for abdominal pain, constipation, diarrhea, heartburn, nausea and vomiting.  Genitourinary: Negative for dysuria, frequency and urgency.  Skin: Negative for itching and rash.  Neurological: Negative for dizziness and headaches.  Endo/Heme/Allergies: Negative for polydipsia.  Psychiatric/Behavioral: Negative for depression.    Past Medical History:  Past Medical History:  Diagnosis Date  . Acid reflux   . Asthma    prn inhaler  . 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 Common migraine with intractable migraine 09/23/2016  . Complication of anesthesia    states is hard to wake up post-op  . Dental bridge present upper  . Dental crown present    lower  . Dumping syndrome   . Endometriosis   . Family history of adverse reaction to anesthesia    states pt's mother woke up during a surgery  . GERD (gastroesophageal reflux disease)      no current med.  Marland Kitchen Migraines     Past Surgical History:  Past Surgical History:  Procedure Laterality Date  . CHONDROPLASTY Right 11/10/2014   Procedure: CHONDROPLASTY;  Surgeon: Marcene Corning, MD;  Location: [facility];  Service: Orthopedics;  Laterality: Right;  . DIAGNOSTIC LAPAROSCOPY     Prior to 2010 x 2  . KNEE ARTHROSCOPY W/ LATERAL RELEASE Right 07/20/2008  . KNEE ARTHROSCOPY WITH FULKERSON SLIDE Right 11/10/2014   Procedure: RIGHT KNEE ARTHROSCOPY WITH Conan Bowens;  Surgeon: Marcene Corning, MD;  Location: [facility];  Service: Orthopedics;  Laterality: Right;  . KNEE ARTHROSCOPY WITH LATERAL RELEASE Right 11/10/2014   Procedure: KNEE ARTHROSCOPY WITH LATERAL RELEASE;  Surgeon: Marcene Corning, MD;  Location:  SUR[facility]ERY [facility];  Service: Orthopedics;  Laterality: Right;  . TONSILLECTOMY N/A 07/31/2015   Procedure: TONSILLECTOMY;  Surgeon: Geanie Logan, MD;  Location: [facility];  Service: ENT;  Laterality: N/A;  . UPPER GI ENDOSCOPY      Gynecologic History:  Patient's last menstrual period was 05/04/2018 (exact date). Contraception: IUD Last Pap: Results were:02/29/2016 NIL and HR HPV negative   Obstetric History: No obstetric history on file.  Family History:  Family History  Problem Relation Age of Onset  . Anesthesia problems Mother        woke up during surgery  . Parkinson's disease Paternal Grandfather   . Migraines Neg Hx     Social History:  Social History   Socioeconomic History  . Marital status: Single  Spouse name: Not on file  . Number of children: 0  . Years of education: Master's  . Highest education level: Not on file  Occupational History  . Occupation: [employer]  Social Needs  . Financial resource strain: Not on file  . Food insecurity:    Worry: Not on file    Inability: Not on file  . Transportation needs:    Medical: Not on file    Non-medical: Not on file  Tobacco Use  . Smoking  status: Former Smoker    Packs/day: 0.50    Years: 4.00    Pack years: 2.00    Types: Cigarettes    Last attempt to quit: 01/11/2015    Years since quitting: 3.4  . Smokeless tobacco: Never Used  . Tobacco comment:    Substance and Sexual Activity  . Alcohol use: Yes    Alcohol/week: 1.0 - 2.0 standard drinks    Types: 1 - 2 Standard drinks or equivalent per week    Comment: occasionally  . Drug use: No  . Sexual activity: Not on file  Lifestyle  . Physical activity:    Days per week: Not on file    Minutes per session: Not on file  . Stress: Not on file  Relationships  . Social connections:    Talks on phone: Not on file    Gets together: Not on file    Attends religious service: Not on file    Active member of club or organization: Not on file    Attends meetings of clubs or organizations: Not on file    Relationship status: Not on file  . Intimate partner violence:    Fear of current or ex partner: Not on file    Emotionally abused: Not on file    Physically abused: Not on file    Forced sexual activity: Not on file  Other Topics Concern  . Not on file  Social History Narrative   Lives at home w/ her brother   Right-handed   Caffeine: 0-4 cups coffee per day    Allergies:  Allergies  Allergen Reactions  . Sulfa Antibiotics Nausea And Vomiting  . Percocet [Oxycodone-Acetaminophen] Nausea And Vomiting  . Propranolol     Rash  . Topamax [Topiramate]     Nausea    Medications: Prior to Admission medications   Medication Sig Start Date End Date Taking? Authorizing Provider  Albuterol Sulfate 108 (90 BASE) MCG/ACT AEPB Inhale into the lungs as needed.    Yes [provider]  BOTOX 100 units SOLR injection INJECT 155 UNITS INTRAMUSCULARLY EVERY 12 WEEKS (GIVEN AT PRESCRIBERS OFFICE, DISCARD UNUSED) 06/04/18  Yes York Spaniel, MD  budesonide-formoterol [facility]) 160-4.5 MCG/ACT inhaler Inhale 2 puffs into the lungs 2 (two) times daily.   Yes  [provider]  cetirizine (ZYRTEC) 10 MG tablet Take 10 mg by mouth daily.   Yes [provider]  Cholecalciferol (VITAMIN D3 PO) Take 2,000 Units by mouth daily.   Yes [provider]  dexlansoprazole (DEXILANT) 60 MG capsule Take 1 capsule by mouth  daily as needed 04/15/16  Yes [provider]  levonorgestrel (MIRENA) 20 MCG/24HR IUD 1 each by Intrauterine route once.   Yes [provider]  Melatonin 10 MG SUBL Place 10 mg under the tongue at bedtime.   Yes [provider]  Multiple Vitamins-Minerals (AIRBORNE GUMMIES) CHEW Chew 1 each by mouth daily.   Yes [provider]  Multiple Vitamins-Minerals (MULTIVITAMIN GUMMIES ADULT  PO) Take 2 each by mouth daily.    Yes [provider]  RABEprazole (ACIPHEX) 20 MG tablet Take 20 mg by mouth 2 (two) times daily.  05/21/16  Yes [provider]  rizatriptan (MAXALT) 10 MG tablet Take 1 tablet at the onset of migraine. May repeat in 2 hours if needed 06/24/17  Yes Butch Penny, NP  spironolactone (ALDACTONE) 25 MG tablet Take 25 mg by mouth 2 (two) times daily.   Yes [provider]    Physical Exam Vitals: Blood pressure 120/80, pulse 82, weight 192 lb (87.1 kg), last menstrual period 05/04/2018.  General: NAD HEENT: normocephalic, anicteric Thyroid: no enlargement, no palpable nodules Pulmonary: No increased work of breathing, CTAB Cardiovascular: RRR, distal pulses 2+ Breast: Breast symmetrical, no tenderness, no palpable nodules or masses, no skin or nipple retraction present, no nipple discharge.  No axillary or supraclavicular lymphadenopathy. Abdomen: NABS, soft, non-tender, non-distended.  Umbilicus without lesions.  No hepatomegaly, splenomegaly or masses palpable. No evidence of hernia  Genitourinary:  External: Normal external female genitalia.  Normal urethral meatus, normal Bartholin's and Skene's glands.    Vagina: Normal vaginal mucosa, no  evidence of prolapse.    Cervix: Grossly normal in appearance, no bleeding  Uterus: Non-enlarged, mobile, normal contour.  No CMT  Adnexa: ovaries non-enlarged, no adnexal masses  Rectal: deferred  Lymphatic: no evidence of inguinal lymphadenopathy Extremities: no edema, erythema, or tenderness Neurologic: Grossly intact Psychiatric: mood appropriate, affect full  Female chaperone present for pelvic and breast  portions of the physical exam   GYNECOLOGY OFFICE PROCEDURE NOTE  LEANZA SHEPPERSON is a 36 y.o. No obstetric history on file. here for IUD removal and reinsertion. The patient currently has a Mirena IUD placed on 06/17/13, which will be replaced with a Mirena IUD today.  No GYN concerns.  Last pap smear was on 02/29/2016 and was normal.  IUD Removal and Reinsertion  Patient identified, informed consent performed, consent signed.   Discussed risks of irregular bleeding, cramping, infection, malpositioning or uterine perforation of the IUD which may require further procedures. Time out was performed. Speculum placed in the vagina. The strings of the IUD were grasped and pulled using ring forceps. The IUD was successfully removed in its entirety. The cervix was cleaned with Betadine x 2 and grasped anteriorly with a single tooth tenaculum.  The uterus was sounded to IUD insertion apparatus was used to sound the uterus to 8 cm using a uterine sound.  The IUD was then placed per manufacturer's recommendations. Strings trimmed to 3 cm. Tenaculum was removed, good hemostasis noted. Patient tolerated procedure well.   Patient was given post-procedure instructions.  Patient was also asked to check IUD strings periodically and follow up in 6 weeks for IUD check.  Assessment: 37 y.o. No obstetric history on file. routine annual exam  Plan: Problem List Items Addressed This Visit    None    Visit Diagnoses    Encounter for gynecological examination without abnormal finding    -  Primary    Breast screening       Screening for malignant neoplasm of cervix       Relevant Orders   Cytology - PAP   Encounter for IUD removal and reinsertion          2) STI screening  was notoffered and therefore not obtained  2)  ASCCP guidelines and rational discussed.  Patient opts for every 3 years screening interval  3) Contraception - the patient  is currently using  IUD.  She is happy with her current form of contraception and plans to continue  4) Routine healthcare maintenance including cholesterol, diabetes screening discussed managed by PCP  5) Return in about 6 weeks (around 08/19/2018) for IUD string check.   Vena Austria, MD, [facility], [facility] 07/08/2018, 10:16 AM

## 2018-07-12 LAB — CYTOLOGY - PAP
Diagnosis: NEGATIVE
HPV: NOT DETECTED

## 2018-08-20 ENCOUNTER — Encounter: Payer: Self-pay | Admitting: Obstetrics and Gynecology

## 2018-08-20 ENCOUNTER — Ambulatory Visit: Payer: Managed Care, Other (non HMO) | Admitting: Obstetrics and Gynecology

## 2018-08-20 VITALS — BP 136/82 | HR 91 | Wt 195.0 lb

## 2018-08-20 DIAGNOSIS — Z30431 Encounter for routine checking of intrauterine contraceptive device: Secondary | ICD-10-CM | POA: Diagnosis not present

## 2018-08-20 NOTE — Progress Notes (Signed)
Obstetrics & Gynecology Office Visit   Chief Complaint:  Chief Complaint  Patient presents with  . Follow-up    IUD Check    History of Present Illness: 37 y.o. patient presenting for follow up of Mirena IUD placement 6+ weeks ago.  The indication for her IUD was contraception.  She denies any complications since her IUD placement.  Still having some occasional spotting.  is able to feel strings.    Review of Systems: Review of Systems  Constitutional: Negative.   Gastrointestinal: Negative.   Genitourinary: Negative.     Past Medical History:  Past Medical History:  Diagnosis Date  . Acid reflux   . Asthma    prn inhaler  . 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 Common migraine with intractable migraine 09/23/2016  . Complication of anesthesia    states is hard to wake up post-op  . Dental bridge present upper  . Dental crown present    lower  . Dumping syndrome   . Endometriosis   . Family history of adverse reaction to anesthesia    states pt's mother woke up during a surgery  . GERD (gastroesophageal reflux disease)    no current med.  Marland Kitchen Migraines     Past Surgical History:  Past Surgical History:  Procedure Laterality Date  . CHONDROPLASTY Right 11/10/2014   Procedure: CHONDROPLASTY;  Surgeon: Marcene Corning, MD;  Location: [facility];  Service: Orthopedics;  Laterality: Right;  . DIAGNOSTIC LAPAROSCOPY     Prior to 2010 x 2  . KNEE ARTHROSCOPY W/ LATERAL RELEASE Right 07/20/2008  . KNEE ARTHROSCOPY WITH FULKERSON SLIDE Right 11/10/2014   Procedure: RIGHT KNEE ARTHROSCOPY WITH Conan Bowens;  Surgeon: Marcene Corning, MD;  Location: [facility];  Service: Orthopedics;  Laterality: Right;  . KNEE ARTHROSCOPY WITH LATERAL RELEASE Right 11/10/2014   Procedure: KNEE ARTHROSCOPY WITH LATERAL RELEASE;  Surgeon: Marcene Corning, MD;  Location: [facility];  Service: Orthopedics;  Laterality: Right;  . TONSILLECTOMY N/A  07/31/2015   Procedure: TONSILLECTOMY;  Surgeon: Geanie Logan, MD;  Location: [facility];  Service: ENT;  Laterality: N/A;  . UPPER GI ENDOSCOPY      Gynecologic History: No LMP recorded. (Menstrual status: IUD).  Obstetric History: No obstetric history on file.  Family History:  Family History  Problem Relation Age of Onset  . Anesthesia problems Mother        woke up during surgery  . Parkinson's disease Paternal Grandfather   . Migraines Neg Hx     Social History:  Social History   Socioeconomic History  . Marital status: Single    Spouse name: Not on file  . Number of children: 0  . Years of education: Master's  . Highest education level: Not on file  Occupational History  . Occupation: [employer]  Social Needs  . Financial resource strain: Not on file  . Food insecurity:    Worry: Not on file    Inability: Not on file  . Transportation needs:    Medical: Not on file    Non-medical: Not on file  Tobacco Use  . Smoking status: Former Smoker    Packs/day: 0.50    Years: 4.00    Pack years: 2.00    Types: Cigarettes    Last attempt to quit: 01/11/2015    Years since quitting: 3.6  . Smokeless tobacco: Never Used  . Tobacco comment:    Substance and Sexual Activity  .  Alcohol use: Yes    Alcohol/week: 1.0 - 2.0 standard drinks    Types: 1 - 2 Standard drinks or equivalent per week    Comment: occasionally  . Drug use: No  . Sexual activity: Not on file  Lifestyle  . Physical activity:    Days per week: Not on file    Minutes per session: Not on file  . Stress: Not on file  Relationships  . Social connections:    Talks on phone: Not on file    Gets together: Not on file    Attends religious service: Not on file    Active member of club or organization: Not on file    Attends meetings of clubs or organizations: Not on file    Relationship status: Not on file  . Intimate partner violence:    Fear of current or ex partner: Not on file     Emotionally abused: Not on file    Physically abused: Not on file    Forced sexual activity: Not on file  Other Topics Concern  . Not on file  Social History Narrative   Lives at home w/ her brother   Right-handed   Caffeine: 0-4 cups coffee per day    Allergies:  Allergies  Allergen Reactions  . Sulfa Antibiotics Nausea And Vomiting  . Percocet [Oxycodone-Acetaminophen] Nausea And Vomiting  . Propranolol     Rash  . Topamax [Topiramate]     Nausea    Medications: Prior to Admission medications   Medication Sig Start Date End Date Taking? Authorizing Provider  Albuterol Sulfate 108 (90 BASE) MCG/ACT AEPB Inhale into the lungs as needed.    Yes [provider]  BOTOX 100 units SOLR injection INJECT 155 UNITS INTRAMUSCULARLY EVERY 12 WEEKS (GIVEN AT PRESCRIBERS OFFICE, DISCARD UNUSED) 06/04/18  Yes York SpanielWillis, Charles K, MD  budesonide-formoterol [facility](SYMBICORT) 160-4.5 MCG/ACT inhaler Inhale 2 puffs into the lungs 2 (two) times daily.   Yes [provider]  cetirizine (ZYRTEC) 10 MG tablet Take 10 mg by mouth daily.   Yes [provider]  Cholecalciferol (VITAMIN D3 PO) Take 2,000 Units by mouth daily.   Yes [provider]  dexlansoprazole (DEXILANT) 60 MG capsule Take 1 capsule by mouth  daily as needed 04/15/16  Yes [provider]  levonorgestrel (MIRENA) 20 MCG/24HR IUD 1 each by Intrauterine route once.   Yes [provider]  Melatonin 10 MG SUBL Place 10 mg under the tongue at bedtime.   Yes [provider]  Multiple Vitamins-Minerals (AIRBORNE GUMMIES) CHEW Chew 1 each by mouth daily.   Yes [provider]  Multiple Vitamins-Minerals (MULTIVITAMIN GUMMIES ADULT PO) Take 2 each by mouth daily.    Yes [provider]  RABEprazole (ACIPHEX) 20 MG tablet Take 20 mg by mouth 2 (two) times daily.  05/21/16  Yes [provider]  rizatriptan (MAXALT) 10 MG tablet Take 1 tablet at the onset of migraine.  May repeat in 2 hours if needed 06/24/17  Yes Butch PennyMillikan, Megan, NP  spironolactone (ALDACTONE) 25 MG tablet Take 25 mg by mouth 2 (two) times daily.   Yes [provider]    Physical Exam Blood pressure 136/82, pulse 91, weight 195 lb (88.5 kg). No LMP recorded. (Menstrual status: IUD).  General: NAD HEENT: normocephalic, anicteric Pulmonary: No increased work of breathing Genitourinary:  External: Normal external female genitalia.  Normal urethral meatus, normal  Bartholin's and Skene's glands.    Vagina: Normal vaginal mucosa, no evidence  of prolapse.    Cervix: Grossly normal in appearance, no bleeding, IUD strings visualized 2cm  Uterus: Non-enlarged, mobile, normal contour.  No CMT  Adnexa: ovaries non-enlarged, no adnexal masses  Rectal: deferred  Lymphatic: no evidence of inguinal lymphadenopathy Extremities: no edema, erythema, or tenderness Neurologic: Grossly intact Psychiatric: mood appropriate, affect full  Female chaperone present for pelvic and breast  portions of the physical exam  Assessment: 37 y.o. No obstetric history on file. No problem-specific Assessment & Plan notes found for this encounter.   Plan: Problem List Items Addressed This Visit    None    Visit Diagnoses    IUD check up    -  Primary       1.  The patient was given instructions to check her IUD strings monthly and call with any problems or concerns.  She should call for fevers, chills, abnormal vaginal discharge, pelvic pain, or other complaints.  2.   IUDs while effective at preventing pregnancy do not prevent transmission of sexually transmitted diseases and use of barrier methods for this purpose was discussed.  Low overall incidence of failure with 99.7% efficacy rate in typical use.  The patient has not contraindication to IUD placement.  3.  She will return for a annual exam in 1 year.  All questions answered.  4) A total of 15 minutes were spent in face-to-face contact with  the patient during this encounter with over half of that time devoted to counseling and coordination of care.  5) Return in about 1 year (around 08/21/2019) for annual.   Vena AustriaAndreas Roxsana Riding, MD, Merlinda FrederickFACOG [facility], [facility] 08/20/2018, 9:35 AM

## 2018-08-23 ENCOUNTER — Telehealth: Payer: Self-pay | Admitting: Neurology

## 2018-08-23 MED ORDER — METHYLPREDNISOLONE 4 MG PO TBPK: ORAL_TABLET | ORAL | 0 refills | Status: DC

## 2018-08-23 NOTE — Telephone Encounter (Signed)
I have called in medrol 4mg  pack to her pharmacy.

## 2018-08-23 NOTE — Telephone Encounter (Signed)
Returned call to patient - she is aware the steroid dose pack has been called to the pharmacy.

## 2018-08-23 NOTE — Telephone Encounter (Signed)
Pt requesting a call stating she would like to come in for an infusion or have a steroid pack sent in for her due to a 2 week on going migraine. Please advise

## 2018-08-23 NOTE — Telephone Encounter (Signed)
I contacted the patient. She stated since 08/14/2018 she has had a migraine. She states during this time she has had pain located in front right side of her head. She reports blurry vision and nausea, but no vomiting. Patient stated on 08/14/2018 she took a dosage of Maxalt 10 mg and had some relief, but by the next day the pain was back.   Patient wanted to know if a steroid dosage back could be sent or if she could come in for an infusion on 08/26/2018.   Patient stated at the beginning of the month she had a cortisol injection for shoulder pain and the injection caused her shoulder to hurt.   Patient has taken the steroid dosage pack before and has experienced relief from migraines while taking.   I advised I would fwd to the on call MD to review and advise on how to proceed. Patient verbalized understanding.

## 2018-08-30 NOTE — Telephone Encounter (Signed)
error 

## 2018-08-30 NOTE — Telephone Encounter (Addendum)
Took call from phone room. Patient called to report migraine is still present and medrol dosepak 4 mg has not helped.  Patient states migraine pain has not eased since starting the dosepak and is having extreme light sensitivity.  Patient is requesting an infusion.

## 2018-08-30 NOTE — Telephone Encounter (Signed)
I called the patient.  The patient is had a headache over the last 2 to 3 weeks, a steroid Dosepak did not offer any benefit.  We will try to get her in for Depacon injection.

## 2018-09-01 HISTORY — PX: SHOULDER ARTHROSCOPY: SHX128

## 2018-09-15 ENCOUNTER — Other Ambulatory Visit: Payer: Self-pay | Admitting: Neurology

## 2018-10-01 ENCOUNTER — Telehealth: Payer: Self-pay | Admitting: Neurology

## 2018-10-01 NOTE — Telephone Encounter (Signed)
Botox authorization [id number] (12/30/18)  Called to schedule delivery with [facility] [phone]. They tried to reach the patient but she did not answer, we are pending patient consent. DW

## 2018-10-04 NOTE — Telephone Encounter (Signed)
I called the patient to ask about the SP. She did not answer so I left a VM asking her to call me back.

## 2018-10-05 NOTE — Telephone Encounter (Signed)
I called and spoke with the patient who stated she had given consent to the pharmacy.   I called the pharmacy and they stated that it was going to be delivered today.

## 2018-10-06 ENCOUNTER — Ambulatory Visit: Payer: Managed Care, Other (non HMO) | Admitting: Neurology

## 2018-10-06 ENCOUNTER — Encounter: Payer: Self-pay | Admitting: Neurology

## 2018-10-06 VITALS — BP 116/73 | HR 83 | Ht 64.0 in | Wt 195.0 lb

## 2018-10-06 DIAGNOSIS — G43019 Migraine without aura, intractable, without status migrainosus: Secondary | ICD-10-CM | POA: Diagnosis not present

## 2018-10-06 NOTE — Procedures (Signed)
     BOTOX PROCEDURE NOTE FOR MIGRAINE HEADACHE   HISTORY: Janet Gallegos is a 38 year old patient with a history of intractable migraine headaches.  In general she has gained good improvement with Botox, but since the end December 2019 she has had virtually daily headaches.  She returns for another Botox treatment.   Description of procedure:  The patient was placed in a sitting position. The standard protocol was used for Botox as follows, with 5 units of Botox injected at each site:   -Procerus muscle, midline injection  -Corrugator muscle, bilateral injection  -Frontalis muscle, bilateral injection, with 2 sites each side, medial injection was performed in the upper one third of the frontalis muscle, in the region vertical from the medial inferior edge of the superior orbital rim. The lateral injection was again in the upper one third of the forehead vertically above the lateral limbus of the cornea, 1.5 cm lateral to the medial injection site.  -Temporalis muscle injection, 4 sites, bilaterally. The first injection was 3 cm above the tragus of the ear, second injection site was 1.5 cm to 3 cm up from the first injection site in line with the tragus of the ear. The third injection site was 1.5-3 cm forward between the first 2 injection sites. The fourth injection site was 1.5 cm posterior to the second injection site.  -Occipitalis muscle injection, 3 sites, bilaterally. The first injection was done one half way between the occipital protuberance and the tip of the mastoid process behind the ear. The second injection site was done lateral and superior to the first, 1 fingerbreadth from the first injection. The third injection site was 1 fingerbreadth superiorly and medially from the first injection site.  -Cervical paraspinal muscle injection, 2 sites, bilateral, the first injection site was 1 cm from the midline of the cervical spine, 3 cm inferior to the lower border of the occipital  protuberance. The second injection site was 1.5 cm superiorly and laterally to the first injection site.  -Trapezius muscle injection was performed at 3 sites, bilaterally. The first injection site was in the upper trapezius muscle halfway between the inflection point of the neck, and the acromion. The second injection site was one half way between the acromion and the first injection site. The third injection was done between the first injection site and the inflection point of the neck.   A 200 unit bottle of Botox was used, 155 units were injected, the rest of the Botox was wasted. The patient tolerated the procedure well, there were no complications of the above procedure.  Botox NDC 1610-9604-54 Lot number [id number] Expiration date May 2022

## 2018-10-06 NOTE — Progress Notes (Signed)
Please refer to Botox procedure note. 

## 2018-11-03 ENCOUNTER — Other Ambulatory Visit: Payer: Self-pay | Admitting: Adult Health

## 2019-01-04 ENCOUNTER — Telehealth: Payer: Self-pay

## 2019-01-04 NOTE — Telephone Encounter (Signed)
I contacted the pt in regards to her 01/05/19 botox appt. I advised, due to current COVID 19 pandemic, our office is severely reducing in office visits until further notice, in order to minimize the risk to our patients and healthcare providers.   Pt was advised we are requesting pt's wait two week for botox but if botox is needed due to worsening migraines we could accommodate. Pt states she could not wait 2 weeks because headaches/migrinaes has worsened due to the weather.   I advised we could see her tomorrow at 2:30 pm and pt was agreeable.   Pt confirmed she has not had any flu like symptoms in the past two weeks, denied testing positive for covid 19 or being exposed to others who have tested positive. Pt was advised to bring a mask to her visit and verbalized understanding.

## 2019-01-05 ENCOUNTER — Other Ambulatory Visit: Payer: Self-pay

## 2019-01-05 ENCOUNTER — Ambulatory Visit: Payer: Managed Care, Other (non HMO) | Admitting: Neurology

## 2019-01-05 ENCOUNTER — Encounter: Payer: Self-pay | Admitting: Neurology

## 2019-01-05 VITALS — BP 123/90 | HR 86 | Temp 97.6°F

## 2019-01-05 DIAGNOSIS — G43019 Migraine without aura, intractable, without status migrainosus: Secondary | ICD-10-CM | POA: Diagnosis not present

## 2019-01-05 NOTE — Procedures (Signed)
     BOTOX PROCEDURE NOTE FOR MIGRAINE HEADACHE   HISTORY: Janet Gallegos is a 38 year old patient with a history of intractable migraine headache.  The patient has done well with Botox, she returns to this office for another treatment.  She has had some headaches within the last week prior to the injection.   Description of procedure:  The patient was placed in a sitting position. The standard protocol was used for Botox as follows, with 5 units of Botox injected at each site:   -Procerus muscle, midline injection  -Corrugator muscle, bilateral injection  -Frontalis muscle, bilateral injection, with 2 sites each side, medial injection was performed in the upper one third of the frontalis muscle, in the region vertical from the medial inferior edge of the superior orbital rim. The lateral injection was again in the upper one third of the forehead vertically above the lateral limbus of the cornea, 1.5 cm lateral to the medial injection site.  -Temporalis muscle injection, 4 sites, bilaterally. The first injection was 3 cm above the tragus of the ear, second injection site was 1.5 cm to 3 cm up from the first injection site in line with the tragus of the ear. The third injection site was 1.5-3 cm forward between the first 2 injection sites. The fourth injection site was 1.5 cm posterior to the second injection site.  -Occipitalis muscle injection, 3 sites, bilaterally. The first injection was done one half way between the occipital protuberance and the tip of the mastoid process behind the ear. The second injection site was done lateral and superior to the first, 1 fingerbreadth from the first injection. The third injection site was 1 fingerbreadth superiorly and medially from the first injection site.  -Cervical paraspinal muscle injection, 2 sites, bilateral, the first injection site was 1 cm from the midline of the cervical spine, 3 cm inferior to the lower border of the occipital  protuberance. The second injection site was 1.5 cm superiorly and laterally to the first injection site.  -Trapezius muscle injection was performed at 3 sites, bilaterally. The first injection site was in the upper trapezius muscle halfway between the inflection point of the neck, and the acromion. The second injection site was one half way between the acromion and the first injection site. The third injection was done between the first injection site and the inflection point of the neck.   A 200 unit bottle of Botox was used, 155 units were injected, the rest of the Botox was wasted. The patient tolerated the procedure well, there were no complications of the above procedure.  Botox NDC 5449-2010-07 Lot number [id number] Expiration date September 2022

## 2019-01-05 NOTE — Progress Notes (Signed)
Please refer to Botox procedure note. 

## 2019-01-12 ENCOUNTER — Other Ambulatory Visit: Payer: Self-pay | Admitting: Neurology

## 2019-01-12 MED ORDER — DEXAMETHASONE 2 MG PO TABS: ORAL_TABLET | ORAL | 0 refills | Status: DC

## 2019-01-25 ENCOUNTER — Telehealth: Payer: Self-pay | Admitting: Neurology

## 2019-01-25 NOTE — Telephone Encounter (Signed)
Pt has called back and accepted the date and time

## 2019-01-25 NOTE — Telephone Encounter (Signed)
Noted, thank you

## 2019-01-25 NOTE — Telephone Encounter (Signed)
I called the patient to schedule her 3 month follow up injection. She did not answer so I left a VM asking her to call us back. If she calls back please confirm that the apt I have scheduled is ok.

## 2019-01-27 ENCOUNTER — Other Ambulatory Visit: Payer: Self-pay | Admitting: Neurology

## 2019-02-01 NOTE — Telephone Encounter (Signed)
Received a fax from [facility] stating the request for botox has been denied. I have placed denial paper work on [place], W desk to review once she returns to the office on 02/02/19.

## 2019-02-01 NOTE — Telephone Encounter (Signed)
Error

## 2019-02-03 NOTE — Telephone Encounter (Signed)
Noted, will apply for a new auth when it is closer to her next apt time. DW

## 2019-04-12 ENCOUNTER — Other Ambulatory Visit: Payer: Self-pay

## 2019-04-12 ENCOUNTER — Encounter: Payer: Self-pay | Admitting: Neurology

## 2019-04-12 ENCOUNTER — Ambulatory Visit (INDEPENDENT_AMBULATORY_CARE_PROVIDER_SITE_OTHER): Payer: Managed Care, Other (non HMO) | Admitting: Neurology

## 2019-04-12 VITALS — BP 108/77 | HR 82

## 2019-04-12 DIAGNOSIS — G43019 Migraine without aura, intractable, without status migrainosus: Secondary | ICD-10-CM

## 2019-04-12 NOTE — Procedures (Signed)
     BOTOX PROCEDURE NOTE FOR MIGRAINE HEADACHE   HISTORY: Janet Gallegos is a 38 year old patient with a history of intractable migraine headache.  She comes in for a Botox injection today.  She is still getting significant benefit with the Botox, weather changes are a significant activator for her however.   Description of procedure:  The patient was placed in a sitting position. The standard protocol was used for Botox as follows, with 5 units of Botox injected at each site:   -Procerus muscle, midline injection  -Corrugator muscle, bilateral injection  -Frontalis muscle, bilateral injection, with 2 sites each side, medial injection was performed in the upper one third of the frontalis muscle, in the region vertical from the medial inferior edge of the superior orbital rim. The lateral injection was again in the upper one third of the forehead vertically above the lateral limbus of the cornea, 1.5 cm lateral to the medial injection site.  -Temporalis muscle injection, 4 sites, bilaterally. The first injection was 3 cm above the tragus of the ear, second injection site was 1.5 cm to 3 cm up from the first injection site in line with the tragus of the ear. The third injection site was 1.5-3 cm forward between the first 2 injection sites. The fourth injection site was 1.5 cm posterior to the second injection site.  -Occipitalis muscle injection, 3 sites, bilaterally. The first injection was done one half way between the occipital protuberance and the tip of the mastoid process behind the ear. The second injection site was done lateral and superior to the first, 1 fingerbreadth from the first injection. The third injection site was 1 fingerbreadth superiorly and medially from the first injection site.  -Cervical paraspinal muscle injection, 2 sites, bilateral, the first injection site was 1 cm from the midline of the cervical spine, 3 cm inferior to the lower border of the occipital  protuberance. The second injection site was 1.5 cm superiorly and laterally to the first injection site.  -Trapezius muscle injection was performed at 3 sites, bilaterally. The first injection site was in the upper trapezius muscle halfway between the inflection point of the neck, and the acromion. The second injection site was one half way between the acromion and the first injection site. The third injection was done between the first injection site and the inflection point of the neck.   A 200 unit bottle of Botox was used, 155 units were injected, the rest of the Botox was wasted. The patient tolerated the procedure well, there were no complications of the above procedure.  Botox NDC 1610-9604-54 Lot number [id number] Expiration date February 2023

## 2019-04-12 NOTE — Progress Notes (Signed)
Please refer to Botox procedure note. 

## 2019-04-18 ENCOUNTER — Telehealth: Payer: Self-pay | Admitting: Neurology

## 2019-04-18 NOTE — Telephone Encounter (Signed)
I called to schedule the patient for her injection but she did not answer so I left a VM asking her to call me back. I went ahead and scheduled an apt, when she calls back please confirm this apt works for her. DW

## 2019-05-30 ENCOUNTER — Other Ambulatory Visit: Payer: Self-pay | Admitting: Podiatry

## 2019-05-30 ENCOUNTER — Ambulatory Visit (INDEPENDENT_AMBULATORY_CARE_PROVIDER_SITE_OTHER): Payer: Managed Care, Other (non HMO)

## 2019-05-30 ENCOUNTER — Ambulatory Visit: Payer: Managed Care, Other (non HMO) | Admitting: Podiatry

## 2019-05-30 ENCOUNTER — Encounter: Payer: Self-pay | Admitting: Podiatry

## 2019-05-30 ENCOUNTER — Other Ambulatory Visit: Payer: Self-pay

## 2019-05-30 DIAGNOSIS — M25369 Other instability, unspecified knee: Secondary | ICD-10-CM | POA: Insufficient documentation

## 2019-05-30 DIAGNOSIS — M2012 Hallux valgus (acquired), left foot: Secondary | ICD-10-CM

## 2019-05-30 DIAGNOSIS — M2011 Hallux valgus (acquired), right foot: Secondary | ICD-10-CM | POA: Diagnosis not present

## 2019-05-30 DIAGNOSIS — M778 Other enthesopathies, not elsewhere classified: Secondary | ICD-10-CM

## 2019-05-30 DIAGNOSIS — M779 Enthesopathy, unspecified: Secondary | ICD-10-CM

## 2019-05-30 NOTE — Progress Notes (Signed)
Subjective:  Patient ID: Janet Gallegos, female    DOB: 12-26-1980,  MRN: [id number] HPI Chief Complaint  Patient presents with  . Foot Pain    Patient presents today for pain in right hallux 1st mpj joint x 1 year.  She says it has been getting worse over the past 6 months.  She reports its sharp pains depending on activities.  She has been using inserts, taking Diclofenac and using ice for treatment    38 y.o. female presents with the above complaint.   ROS: Denies fever chills nausea vomiting muscle aches pains calf pain back pain chest pain shortness of breath.  Past Medical History:  Diagnosis Date  . Acid reflux   . Asthma    prn inhaler  . 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 Common migraine with intractable migraine 09/23/2016  . Complication of anesthesia    states is hard to wake up post-op  . Dental bridge present upper  . Dental crown present    lower  . Dumping syndrome   . Endometriosis   . Family history of adverse reaction to anesthesia    states pt's mother woke up during a surgery  . GERD (gastroesophageal reflux disease)    no current med.  Marland Kitchen Migraines    Past Surgical History:  Procedure Laterality Date  . CHONDROPLASTY Right 11/10/2014   Procedure: CHONDROPLASTY;  Surgeon: Melrose Nakayama, MD;  Location: [place];  Service: Orthopedics;  Laterality: Right;  . DIAGNOSTIC LAPAROSCOPY     Prior to 2010 x 2  . KNEE ARTHROSCOPY W/ LATERAL RELEASE Right 07/20/2008  . KNEE ARTHROSCOPY WITH FULKERSON SLIDE Right 11/10/2014   Procedure: RIGHT KNEE ARTHROSCOPY WITH Elmarie Mainland;  Surgeon: Melrose Nakayama, MD;  Location: [place];  Service: Orthopedics;  Laterality: Right;  . KNEE ARTHROSCOPY WITH LATERAL RELEASE Right 11/10/2014   Procedure: KNEE ARTHROSCOPY WITH LATERAL RELEASE;  Surgeon: Melrose Nakayama, MD;  Location: [place];  Service: Orthopedics;  Laterality: Right;  . TONSILLECTOMY N/A 07/31/2015   Procedure: TONSILLECTOMY;  Surgeon: Clyde Canterbury, MD;  Location: [place];  Service: ENT;  Laterality: N/A;  . UPPER GI ENDOSCOPY      Current Outpatient Medications:  .  Albuterol Sulfate 108 (90 BASE) MCG/ACT AEPB, Inhale into the lungs as needed. , Disp: , Rfl:  .  BOTOX 100 units SOLR injection, INJECT 155 UNITS  INTRAMUSCULARLY EVERY 12  WEEKS (GIVEN AT MD OFFICE,  DISCARD UNUSED), Disp: 2 each, Rfl: 1 .  budesonide-formoterol (SYMBICORT) 160-4.5 MCG/ACT inhaler, Inhale 2 puffs into the lungs 2 (two) times daily., Disp: , Rfl:  .  cetirizine (ZYRTEC) 10 MG tablet, Take 10 mg by mouth daily., Disp: , Rfl:  .  dexlansoprazole (DEXILANT) 60 MG capsule, Take 1 capsule by mouth  daily as needed, Disp: , Rfl:  .  diclofenac (VOLTAREN) 75 MG EC tablet, , Disp: , Rfl:  .  levonorgestrel (MIRENA) 20 MCG/24HR IUD, 1 each by Intrauterine route once., Disp: , Rfl:  .  Multiple Vitamins-Minerals (AIRBORNE GUMMIES) CHEW, Chew 1 each by mouth daily., Disp: , Rfl:  .  RABEprazole (ACIPHEX) 20 MG tablet, Take 20 mg by mouth 2 (two) times daily. , Disp: , Rfl:  .  rizatriptan (MAXALT) 10 MG tablet, TAKE 1 TABLET BY MOUTH AT ONSET OF MIGRAINE MAY REPEAT IN 2 HRS IF NEEDED, Disp: 10 tablet, Rfl: 11 .  spironolactone (ALDACTONE) 25 MG tablet, Take 25 mg by mouth 2 (  two) times daily., Disp: , Rfl:   Allergies  Allergen Reactions  . Sulfa Antibiotics Nausea And Vomiting  . Percocet [Oxycodone-Acetaminophen] Nausea And Vomiting  . Propranolol     Rash  . Topamax [Topiramate]     Nausea   Review of Systems Objective:  There were no vitals filed for this visit.  General: Well developed, nourished, in no acute distress, alert and oriented x3   Dermatological: Skin is warm, dry and supple bilateral. Nails x 10 are well maintained; remaining integument appears unremarkable at this time. There are no open sores, no preulcerative lesions, no rash or signs of infection present.  Vascular: Dorsalis  Pedis artery and Posterior Tibial artery pedal pulses are 2/4 bilateral with immedate capillary fill time. Pedal hair growth present. No varicosities and no lower extremity edema present bilateral.   Neruologic: Grossly intact via light touch bilateral. Vibratory intact via tuning fork bilateral. Protective threshold with Semmes Wienstein monofilament intact to all pedal sites bilateral. Patellar and Achilles deep tendon reflexes 2+ bilateral. No Babinski or clonus noted bilateral.   Musculoskeletal: No gross boney pedal deformities bilateral. No pain, crepitus, or limitation noted with foot and ankle range of motion bilateral. Muscular strength 5/5 in all groups tested bilateral.  Pain on palpation and range of motion of the first metatarsal phalangeal joint of the right foot.  Gait: Unassisted, Nonantalgic.    Radiographs:  Radiographs demonstrate some early joint space narrowing but no acute osseous abnormality.  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  Plan: Discussed etiology pathology conservative surgical therapies at this point after sterile Betadine skin prep I injected 2 mg of dexamethasone and local anesthetic into the joint.  She tolerated procedure well without complications.  I will follow-up with her in 1 month or 6 weeks.     Guneet Delpino T. [place], [place]

## 2019-06-07 ENCOUNTER — Other Ambulatory Visit: Payer: Self-pay | Admitting: Neurology

## 2019-06-07 MED ORDER — DEXAMETHASONE 2 MG PO TABS: ORAL_TABLET | ORAL | 0 refills | Status: DC

## 2019-07-11 ENCOUNTER — Telehealth: Payer: Self-pay

## 2019-07-11 NOTE — Telephone Encounter (Signed)
I called the pharmacy but they stated the patient is no longer covered for Botox under her plan. I called the patient to inquire but she did not answer so I left a VM asking her to call back. DW

## 2019-07-11 NOTE — Telephone Encounter (Signed)
I spoke with the patient who stated that nothing had changed. I called the pharmacy back to make them aware and they stated that it would need to go through medical benefits. They will start the process.

## 2019-07-12 NOTE — Telephone Encounter (Signed)
I called and spoke with a rep this morning who tried running it through medical benefits, after holding 30 minutes they came back and stated it needed a PA and to be ran through pharmacy benefits. I informed her that I was told yesterday it could not go through pharmacy benefits. She stated that it could not go through medical benefits and routed me back to the PA line. DW  I spoke with the PA department to start an authorization. The pharmacy wanted to know if we had documentation of 3 tried and failed OTC medication. They requested additional information because she did not meet the immediate requirements due to this. I gave additional information.  I have called to make the patient aware and cancel her apt until we hear back from her pharmacy. She did not answer so I left a VM asking her to call back.

## 2019-07-13 ENCOUNTER — Ambulatory Visit: Payer: Managed Care, Other (non HMO) | Admitting: Neurology

## 2019-07-13 NOTE — Telephone Encounter (Signed)
I spoke with the pharmacy today and they were able to work out the issues with the patients insurance but she does have a balance. They reached out to the patient who stated she would call back to pay balance at a later time.

## 2019-07-18 NOTE — Telephone Encounter (Signed)
[facility] is asking for a call from [place] to schedule botox delivery

## 2019-07-19 NOTE — Telephone Encounter (Signed)
I called the patient to offer her apt with Amy NP tomorrow or Thursday, she did not answer so I left a VM asking her to call back. If she calls back please offer her these apt dates.

## 2019-07-20 NOTE — Progress Notes (Signed)
Consent Form Botulism Toxin Injection For Chronic Migraine  She continues to note improvement of frequency and intensity of migraines with Botox therapy. She notes worsening of migraines a week prior to next injection. She has a migraine today. She uses rizatriptan for abortive therapy but has not taken today. She will take rizatriptan with benadryl and Aleve when she gets home. Rest advised. She will call for worsening.   Reviewed orally with patient, additionally signature is on file:  Botulism toxin has been approved by the Federal drug administration for treatment of chronic migraine. Botulism toxin does not cure chronic migraine and it may not be effective in some patients.  The administration of botulism toxin is accomplished by injecting a small amount of toxin into the muscles of the neck and head. Dosage must be titrated for each individual. Any benefits resulting from botulism toxin tend to wear off after 3 months with a repeat injection required if benefit is to be maintained. Injections are usually done every 3-4 months with maximum effect peak achieved by about 2 or 3 weeks. Botulism toxin is expensive and you should be sure of what costs you will incur resulting from the injection.  The side effects of botulism toxin use for chronic migraine may include:   -Transient, and usually mild, facial weakness with facial injections  -Transient, and usually mild, head or neck weakness with head/neck injections  -Reduction or loss of forehead facial animation due to forehead muscle weakness  -Eyelid drooping  -Dry eye  -Pain at the site of injection or bruising at the site of injection  -Double vision  -Potential unknown long term risks   Contraindications: You should not have Botox if you are pregnant, nursing, allergic to albumin, have an infection, skin condition, or muscle weakness at the site of the injection, or have myasthenia gravis, Lambert-Eaton syndrome, or ALS.  It is  also possible that as with any injection, there may be an allergic reaction or no effect from the medication. Reduced effectiveness after repeated injections is sometimes seen and rarely infection at the injection site may occur. All care will be taken to prevent these side effects. If therapy is given over a long time, atrophy and wasting in the muscle injected may occur. Occasionally the patient's become refractory to treatment because they develop antibodies to the toxin. In this event, therapy needs to be modified.  I have read the above information and consent to the administration of botulism toxin.    BOTOX PROCEDURE NOTE FOR MIGRAINE HEADACHE  Contraindications and precautions discussed with patient(above). Aseptic procedure was observed and patient tolerated procedure. Procedure performed by Debbora Presto, FNP-C.   The condition has existed for more than 6 months, and pt does not have a diagnosis of ALS, Myasthenia Gravis or Lambert-Eaton Syndrome.  Risks and benefits of injections discussed and pt agrees to proceed with the procedure.  Written consent obtained  These injections are medically necessary. Pt  receives good benefits from these injections. These injections do not cause sedations or hallucinations which the oral therapies may cause.   Description of procedure:  The patient was placed in a sitting position. The standard protocol was used for Botox as follows, with 5 units of Botox injected at each site:  -Procerus muscle, midline injection  -Corrugator muscle, bilateral injection  -Frontalis muscle, bilateral injection, with 2 sites each side, medial injection was performed in the upper one third of the frontalis muscle, in the region vertical from the medial inferior edge  of the superior orbital rim. The lateral injection was again in the upper one third of the forehead vertically above the lateral limbus of the cornea, 1.5 cm lateral to the medial injection site.   -Temporalis muscle injection, 4 sites, bilaterally. The first injection was 3 cm above the tragus of the ear, second injection site was 1.5 cm to 3 cm up from the first injection site in line with the tragus of the ear. The third injection site was 1.5-3 cm forward between the first 2 injection sites. The fourth injection site was 1.5 cm posterior to the second injection site. 5th site laterally in the temporalis  muscleat the level of the outer canthus.  -Occipitalis muscle injection, 3 sites, bilaterally. The first injection was done one half way between the occipital protuberance and the tip of the mastoid process behind the ear. The second injection site was done lateral and superior to the first, 1 fingerbreadth from the first injection. The third injection site was 1 fingerbreadth superiorly and medially from the first injection site.  -Cervical paraspinal muscle injection, 2 sites, bilateral knee first injection site was 1 cm from the midline of the cervical spine, 3 cm inferior to the lower border of the occipital protuberance. The second injection site was 1.5 cm superiorly and laterally to the first injection site.  -Trapezius muscle injection was performed at 3 sites, bilaterally. The first injection site was in the upper trapezius muscle halfway between the inflection point of the neck, and the acromion. The second injection site was one half way between the acromion and the first injection site. The third injection was done between the first injection site and the inflection point of the neck.   Will return for repeat injection in 3 months.   A 155 units of Botox was used, any Botox not injected was wasted. The patient tolerated the procedure well, there were no complications of the above procedure.

## 2019-07-21 ENCOUNTER — Encounter: Payer: Self-pay | Admitting: Family Medicine

## 2019-07-21 ENCOUNTER — Ambulatory Visit: Payer: Managed Care, Other (non HMO) | Admitting: Family Medicine

## 2019-07-21 ENCOUNTER — Other Ambulatory Visit: Payer: Self-pay

## 2019-07-21 ENCOUNTER — Telehealth: Payer: Self-pay

## 2019-07-21 DIAGNOSIS — G43019 Migraine without aura, intractable, without status migrainosus: Secondary | ICD-10-CM | POA: Diagnosis not present

## 2019-07-21 NOTE — Progress Notes (Signed)
[facility]  [id number]  2x 100 unit vials ogf Botox OnabotulinumtoxinA Lot: [id number]  Ex: 03/2022  Saline. Bacteriostatic 0.9 % Sodium Chloride Lot: [id number] Exp: 01 March 2020  Pt state she is not taking any aspirin nor blood thinners.  Pt demonstrated understanding of side effects. Signed document for permission.

## 2019-07-21 NOTE — Progress Notes (Signed)
I have read the note, and I agree with the clinical assessment and plan.  Brentyn Seehafer K Shakyla Nolley   

## 2019-07-21 NOTE — Telephone Encounter (Signed)
Making a second note aside from visit note. For documentation purposes.  [place]  [id number]  2x 100 unit vials ogf Botox OnabotulinumtoxinA Lot: [id number]  Ex: 03/2022  Saline. Bacteriostatic 0.9 % Sodium Chloride Lot: [id number] Exp: 01 March 2020  Pt state she is not taking any aspirin nor blood thinners.  Pt demonstrated understanding of side effects. Signed document for permission.

## 2019-07-25 ENCOUNTER — Other Ambulatory Visit: Payer: Self-pay

## 2019-07-25 ENCOUNTER — Encounter: Payer: Self-pay | Admitting: Podiatry

## 2019-07-25 ENCOUNTER — Ambulatory Visit: Payer: Managed Care, Other (non HMO) | Admitting: Podiatry

## 2019-07-25 DIAGNOSIS — M19071 Primary osteoarthritis, right ankle and foot: Secondary | ICD-10-CM

## 2019-07-25 DIAGNOSIS — M778 Other enthesopathies, not elsewhere classified: Secondary | ICD-10-CM

## 2019-07-25 NOTE — Progress Notes (Signed)
She presents today chief complaint of painful first metatarsophalangeal joint of the right foot.  States the injection may have helped by about 10% but for the most part has been exquisitely painful now for 1 year.  She states is becoming more more painful harder and harder to walk on it should like to have an answer and possibly surgical intervention.  Objective: Vital signs are stable she is alert and oriented x3.  There is moderate edema no erythema cellulitis drainage or odor she has pain on range of motion with a mild hallux valgus deformity.  The majority of the pain is over the dorsal lateral aspect of the joint.  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  Plan: We will request an MRI secondary to trauma of the first metatarsophalangeal joint of the right foot.  X1 year surgical consideration

## 2019-08-05 ENCOUNTER — Encounter: Payer: Self-pay | Admitting: Obstetrics and Gynecology

## 2019-08-05 ENCOUNTER — Other Ambulatory Visit: Payer: Self-pay

## 2019-08-05 ENCOUNTER — Ambulatory Visit (INDEPENDENT_AMBULATORY_CARE_PROVIDER_SITE_OTHER): Payer: Managed Care, Other (non HMO) | Admitting: Obstetrics and Gynecology

## 2019-08-05 VITALS — BP 126/82 | HR 95 | Ht 64.0 in | Wt 198.0 lb

## 2019-08-05 DIAGNOSIS — Z01419 Encounter for gynecological examination (general) (routine) without abnormal findings: Secondary | ICD-10-CM | POA: Diagnosis not present

## 2019-08-05 DIAGNOSIS — Z1239 Encounter for other screening for malignant neoplasm of breast: Secondary | ICD-10-CM

## 2019-08-05 NOTE — Progress Notes (Signed)
Gynecology Annual Exam   PCP: Ronal Fear, NP  Chief Complaint:  Chief Complaint  Patient presents with  . Gynecologic Exam    Right side pain radiates to groin    History of Present Illness: Patient is a 38 y.o. G0P0000 presents for annual exam. The patient has no complaints today.   LMP: No LMP recorded. (Menstrual status: IUD).  Not currently sexually active.  Only intermittent spotting with IUD.  Does report that all the women in her family underwent early menopause.  She has not noted any vasomotor type symptoms.  There is no notable family history of breast or ovarian cancer in her family.  The patient wears seatbelts: yes.   The patient has regular exercise: not asked.    The patient denies current symptoms of depression.    Review of Systems: Review of Systems  Constitutional: Negative for chills and fever.  HENT: Negative for congestion.   Respiratory: Negative for cough and shortness of breath.   Cardiovascular: Negative for chest pain and palpitations.  Gastrointestinal: Negative for abdominal pain, constipation, diarrhea, heartburn, nausea and vomiting.  Genitourinary: Negative for dysuria, frequency and urgency.  Skin: Negative for itching and rash.  Neurological: Negative for dizziness and headaches.  Endo/Heme/Allergies: Negative for polydipsia.  Psychiatric/Behavioral: Negative for depression.    Past Medical History:  Past Medical History:  Diagnosis Date  . Acid reflux   . Asthma    prn inhaler  . 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 Common migraine with intractable migraine 09/23/2016  . Complication of anesthesia    states is hard to wake up post-op  . Dental bridge present upper  . Dental crown present    lower  . Dumping syndrome   . Endometriosis   . Family history of adverse reaction to anesthesia    states pt's mother woke up during a surgery  . GERD (gastroesophageal reflux disease)    no current med.  Marland Kitchen Migraines     Past  Surgical History:  Past Surgical History:  Procedure Laterality Date  . CHONDROPLASTY Right 11/10/2014   Procedure: CHONDROPLASTY;  Surgeon: Marcene Corning, MD;  Location: [facility];  Service: Orthopedics;  Laterality: Right;  . DIAGNOSTIC LAPAROSCOPY     Prior to 2010 x 2  . KNEE ARTHROSCOPY W/ LATERAL RELEASE Right 07/20/2008  . KNEE ARTHROSCOPY WITH FULKERSON SLIDE Right 11/10/2014   Procedure: RIGHT KNEE ARTHROSCOPY WITH Conan Bowens;  Surgeon: Marcene Corning, MD;  Location: [facility];  Service: Orthopedics;  Laterality: Right;  . KNEE ARTHROSCOPY WITH LATERAL RELEASE Right 11/10/2014   Procedure: KNEE ARTHROSCOPY WITH LATERAL RELEASE;  Surgeon: Marcene Corning, MD;  Location: [facility];  Service: Orthopedics;  Laterality: Right;  . SHOULDER ARTHROSCOPY  2020   Left   . TONSILLECTOMY N/A 07/31/2015   Procedure: TONSILLECTOMY;  Surgeon: Geanie Logan, MD;  Location[facility];  Service: ENT;  Laterality: N/A;  . UPPER GI ENDOSCOPY      Gynecologic History:  No LMP recorded. (Menstrual status: IUD). Contraception:07/08/2018 Mirena  IUD Last Pap: Results were:07/08/2018 NIL and HR HPV negative   Obstetric History: G0P0000  Family History:  Family History  Problem Relation Age of Onset  . Anesthesia problems Mother        woke up during surgery  . Parkinson's disease Paternal Grandfather   . Migraines Neg Hx     Social History:  Social History   Socioeconomic History  .  Marital status: Single    Spouse name: Not on file  . Number of children: 0  . Years of education: Master's  . Highest education level: Not on file  Occupational History  . Occupation: [employer]  Social Needs  . Financial resource strain: Not on file  . Food insecurity    Worry: Not on file    Inability: Not on file  . Transportation needs    Medical: Not on file    Non-medical: Not on file  Tobacco Use  . Smoking status: Former Smoker     Packs/day: 0.50    Years: 4.00    Pack years: 2.00    Types: Cigarettes    Quit date: 01/11/2015    Years since quitting: 4.5  . Smokeless tobacco: Never Used  . Tobacco comment:    Substance and Sexual Activity  . Alcohol use: Yes    Alcohol/week: 1.0 - 2.0 standard drinks    Types: 1 - 2 Standard drinks or equivalent per week    Comment: occasionally  . Drug use: No  . Sexual activity: Not Currently    Birth control/protection: I.U.D.  Lifestyle  . Physical activity    Days per week: Not on file    Minutes per session: Not on file  . Stress: Not on file  Relationships  . Social Musicianconnections    Talks on phone: Not on file    Gets together: Not on file    Attends religious service: Not on file    Active member of club or organization: Not on file    Attends meetings of clubs or organizations: Not on file    Relationship status: Not on file  . Intimate partner violence    Fear of current or ex partner: Not on file    Emotionally abused: Not on file    Physically abused: Not on file    Forced sexual activity: Not on file  Other Topics Concern  . Not on file  Social History Narrative   Lives at home w/ her brother   Right-handed   Caffeine: 0-4 cups coffee per day    Allergies:  Allergies  Allergen Reactions  . Sulfa Antibiotics Nausea And Vomiting  . Percocet [Oxycodone-Acetaminophen] Nausea And Vomiting  . Propranolol     Rash  . Topamax [Topiramate]     Nausea    Medications: Prior to Admission medications   Medication Sig Start Date End Date Taking? Authorizing Provider  Albuterol Sulfate 108 (90 BASE) MCG/ACT AEPB Inhale into the lungs as needed.    Yes [provider]  AMZEEQ 4 % FOAM  06/04/19  Yes [provider]  BOTOX 100 units SOLR injection INJECT 155 UNITS  INTRAMUSCULARLY EVERY 12  WEEKS (GIVEN AT MD OFFICE,  DISCARD UNUSED) 01/27/19  Yes York SpanielWillis, Charles K, MD  budesonide-formoterol [facility](SYMBICORT) 160-4.5 MCG/ACT inhaler Inhale 2 puffs  into the lungs 2 (two) times daily.   Yes [provider]  cetirizine (ZYRTEC) 10 MG tablet Take 10 mg by mouth daily.   Yes [provider]  dexlansoprazole (DEXILANT) 60 MG capsule Take 1 capsule by mouth  daily as needed 04/15/16  Yes [provider]  levonorgestrel (MIRENA) 20 MCG/24HR IUD 1 each by Intrauterine route once.   Yes [provider]  Multiple Vitamins-Minerals (AIRBORNE GUMMIES) CHEW Chew 1 each by mouth daily.   Yes [provider]  RABEprazole (ACIPHEX) 20 MG tablet Take 20 mg by mouth 2 (two) times daily.  05/21/16  Yes [provider]  rizatriptan (MAXALT) 10 MG tablet TAKE 1 TABLET BY MOUTH AT ONSET OF MIGRAINE MAY REPEAT IN 2 HRS IF NEEDED 11/03/18  Yes Kathrynn Ducking, MD  spironolactone (ALDACTONE) 25 MG tablet Take 25 mg by mouth 2 (two) times daily.   Yes [provider]    Physical Exam Vitals: Blood pressure 126/82, pulse 95, height 5\' 4"  (1.626 m), weight 198 lb (89.8 kg).  General: NAD HEENT: normocephalic, anicteric Thyroid: no enlargement, no palpable nodules Pulmonary: No increased work of breathing, CTAB Cardiovascular: RRR, distal pulses 2+ Breast: Breast symmetrical, no tenderness, no palpable nodules or masses, no skin or nipple retraction present, no nipple discharge.  No axillary or supraclavicular lymphadenopathy. Abdomen: NABS, soft, non-tender, non-distended.  Umbilicus without lesions.  No hepatomegaly, splenomegaly or masses palpable. No evidence of hernia  Genitourinary:  External: Normal external female genitalia.  Normal urethral meatus, normal Bartholin's and Skene's glands.    Vagina: Normal vaginal mucosa, no evidence of prolapse.    Cervix: Grossly normal in appearance, no bleeding, IUD strings visualized  Uterus: Non-enlarged, mobile, normal contour.  No CMT  Adnexa: ovaries non-enlarged, no adnexal masses  Rectal: deferred  Lymphatic: no evidence of inguinal lymphadenopathy  Extremities: no edema, erythema, or tenderness Neurologic: Grossly intact Psychiatric: mood appropriate, affect full  Female chaperone present for pelvic and breast  portions of the physical exam    Assessment: 38 y.o. G0P0000 routine annual exam  Plan: Problem List Items Addressed This Visit    None    Visit Diagnoses    Encounter for gynecological examination without abnormal finding    -  Primary   Breast screening          1) STI screening  wasoffered and declined  2)  ASCCP guidelines and rational discussed.  Patient opts for every 3 years screening interval  3) Contraception - the patient is currently using  IUD.  She is happy with her current form of contraception and plans to continue  4) Routine healthcare maintenance including cholesterol, diabetes screening discussed managed by PCP  5) No follow-ups on file.   Malachy Mood, MD, [facility], [place] 08/05/2019, 11:04 AM

## 2019-09-01 ENCOUNTER — Telehealth: Payer: Self-pay

## 2019-09-01 DIAGNOSIS — M19071 Primary osteoarthritis, right ankle and foot: Secondary | ICD-10-CM

## 2019-09-01 NOTE — Telephone Encounter (Signed)
Per Ernst Spell, MRI has been approved from 07/27/2019 to 01/23/2020  Auth # [id number]  Patient has been notified and will call to schedule appt to her convenience

## 2019-09-01 NOTE — Telephone Encounter (Signed)
-----   Message from Rip Harbour, [facility] sent at 07/25/2019  1:30 PM EST ----- Regarding: MRI MRI right foot - evaluate 1st MPJ right - history of injury 1 year ago - surgical consideration

## 2019-09-06 ENCOUNTER — Telehealth: Payer: Self-pay | Admitting: Family Medicine

## 2019-09-06 ENCOUNTER — Encounter: Payer: Self-pay | Admitting: Family Medicine

## 2019-09-06 MED ORDER — DEXAMETHASONE 2 MG PO TABS: ORAL_TABLET | ORAL | 0 refills | Status: DC

## 2019-09-06 NOTE — Telephone Encounter (Signed)
Please make sure she received Mychart message regarding dexamethasone being called into pharmacy for migraine. Three tablets today, 2 tomorrow and 1 on Thursday. She can repeat rizatriptan if needed. Lots of water and rest.

## 2019-09-06 NOTE — Telephone Encounter (Signed)
Relayed the information. PT demonstrated understanding and did not have any further questions at this time.

## 2019-09-09 ENCOUNTER — Other Ambulatory Visit: Payer: Self-pay | Admitting: Neurology

## 2019-09-10 ENCOUNTER — Ambulatory Visit
Admission: RE | Admit: 2019-09-10 | Discharge: 2019-09-10 | Disposition: A | Discharge status: Home / Self Care | Payer: Managed Care, Other (non HMO) | Source: Ambulatory Visit | Attending: Podiatry | Admitting: Podiatry

## 2019-09-10 ENCOUNTER — Other Ambulatory Visit: Payer: Self-pay

## 2019-09-10 DIAGNOSIS — M19071 Primary osteoarthritis, right ankle and foot: Secondary | ICD-10-CM | POA: Diagnosis present

## 2019-09-14 ENCOUNTER — Telehealth: Payer: Self-pay

## 2019-09-14 ENCOUNTER — Telehealth: Payer: Self-pay | Admitting: *Deleted

## 2019-09-14 NOTE — Telephone Encounter (Signed)
Pt given depacon 1 gm IV, toradol 30mg  and soumedrol 125mg  IV.  She was level 6 to 2 when she left.

## 2019-09-14 NOTE — Telephone Encounter (Signed)
I emailed pt but also LMVM for her to return call about getting depacon infusion.  She is to call me back.

## 2019-09-14 NOTE — Telephone Encounter (Signed)
I called Janet Gallegos and spoke with representative Darl Pikes to inform her her denial on the DOS 11.19.2020 claim for procedure code 56153. I informed her that I had spoken with Janet Gallegos on 11.10.2020 to obtain authorization for [id number] and was told they could not complete authorization that I had to go through Pharmacy benefits for [phone] authorization. We completed PA through pharmacy benefits for Jcode and now they are denying claim for 09295.   She added 74734 to date 11.19.2020  Authorization- [id number] (11.19.2020-11.19.2021). DWD

## 2019-09-14 NOTE — Telephone Encounter (Signed)
Patient returned missed call. Please follow up. °

## 2019-09-14 NOTE — Telephone Encounter (Signed)
She will come in for her depacon infusion this am.  I relayed to [facility] and gave her the order sheet, depending if she had a driver.

## 2019-09-16 ENCOUNTER — Telehealth: Payer: Self-pay | Admitting: *Deleted

## 2019-09-16 NOTE — Telephone Encounter (Signed)
Left message informing pt of Dr. Geryl Rankins request to send copy of MRI disc to radiology specialist for more details for treatment planning, there will be a 10-14 day delay in final results, once received I will call with instructions. Faxed request for disc to [facility] Records.

## 2019-09-16 NOTE — Telephone Encounter (Signed)
-----   Message from Elinor Parkinson, [place] sent at 09/13/2019  7:49 AM EST ----- Please send for an over read and inform patient of the delay.

## 2019-09-22 ENCOUNTER — Ambulatory Visit: Payer: Managed Care, Other (non HMO) | Attending: Internal Medicine

## 2019-09-22 DIAGNOSIS — Z20822 Contact with and (suspected) exposure to covid-19: Secondary | ICD-10-CM

## 2019-09-22 NOTE — Telephone Encounter (Signed)
Received MRI disc copy from [facility] and mailed to [facility].

## 2019-09-23 LAB — NOVEL CORONAVIRUS, NAA: SARS-CoV-2, NAA: NOT DETECTED

## 2019-09-26 ENCOUNTER — Ambulatory Visit: Payer: Managed Care, Other (non HMO) | Attending: Internal Medicine

## 2019-09-26 DIAGNOSIS — Z20822 Contact with and (suspected) exposure to covid-19: Secondary | ICD-10-CM

## 2019-09-27 LAB — NOVEL CORONAVIRUS, NAA: SARS-CoV-2, NAA: NOT DETECTED

## 2019-10-10 ENCOUNTER — Ambulatory Visit: Payer: Managed Care, Other (non HMO) | Attending: Internal Medicine

## 2019-10-10 ENCOUNTER — Encounter: Payer: Self-pay | Admitting: Podiatry

## 2019-10-10 DIAGNOSIS — Z20822 Contact with and (suspected) exposure to covid-19: Secondary | ICD-10-CM

## 2019-10-11 LAB — NOVEL CORONAVIRUS, NAA: SARS-CoV-2, NAA: NOT DETECTED

## 2019-10-18 NOTE — Telephone Encounter (Signed)
Mirena rcvd/charged 07/08/18

## 2019-10-25 ENCOUNTER — Telehealth: Payer: Self-pay | Admitting: Podiatry

## 2019-10-25 NOTE — Telephone Encounter (Signed)
Called patient, scheduled appointment with Dr. Al Corpus to discuss overread results. Appt is 11/28/19@ 1:45

## 2019-10-25 NOTE — Telephone Encounter (Signed)
-----   Message from Constance Haw, LPN sent at 10/21/2540  4:33 PM EST ----- Please call patient and schedule follow up MRI with Dr. Al Corpus.  Please let her know that we have her overread back and she will need to come into office to discuss results with Dr. Al Corpus.  Thanks!

## 2019-11-02 ENCOUNTER — Encounter: Payer: Self-pay | Admitting: Family Medicine

## 2019-11-28 ENCOUNTER — Ambulatory Visit: Payer: Managed Care, Other (non HMO) | Admitting: Podiatry

## 2019-12-07 ENCOUNTER — Ambulatory Visit (INDEPENDENT_AMBULATORY_CARE_PROVIDER_SITE_OTHER): Payer: Managed Care, Other (non HMO)

## 2019-12-07 ENCOUNTER — Other Ambulatory Visit: Payer: Self-pay

## 2019-12-07 ENCOUNTER — Encounter: Payer: Self-pay | Admitting: Podiatry

## 2019-12-07 ENCOUNTER — Ambulatory Visit: Payer: Managed Care, Other (non HMO) | Admitting: Podiatry

## 2019-12-07 ENCOUNTER — Telehealth: Payer: Self-pay | Admitting: *Deleted

## 2019-12-07 DIAGNOSIS — M19071 Primary osteoarthritis, right ankle and foot: Secondary | ICD-10-CM

## 2019-12-07 NOTE — Progress Notes (Signed)
She presents today for follow-up of her first metatarsophalangeal joint of the right foot.  States that really has not changed and continues to hurt every day.  She states that even shoe gear is becoming impossible at this point.  ROS: Denies fever chills nausea vomiting muscle aches pains calf pain back pain chest pain shortness of breath.  She denies any changes in her medications.  Denies any changes in her allergies.  Objective: Signs are stable she is alert and oriented x3 she has pain on end range of motion of the first metatarsophalangeal joint of the right foot.  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  There still does not appear to be a lot of joint space narrowing but I do visualize the length of the first metatarsal compared to the lesser metatarsals and the elevation.  I do also see the lateral view some small dorsal spur formation.  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  Plan: Discussed etiology pathology conservative versus surgical therapies at this point conservative therapy injections bracing the use of a cam walker has failed to alleviate this patient's symptoms.  So we consented her for an Vassie Moselle Zwick osteotomy with screw fixation.  I consented her for this today and discussed healing time as well as possible complications which may include but are not limited to postop pain bleeding swelling infection loss of digit loss of limb loss of life loss of sensation.  She understands this is amenable to it we dispensed information regarding the surgery center anesthesia group and information for the morning of surgery.  She also received a Cam walker and I will follow-up with her in the near future for surgical intervention.

## 2019-12-07 NOTE — Telephone Encounter (Signed)
I called [facility] [phone] and spoke to [place].  She states [phone] and [id number] are billable but require PA.  She transferred me to initiate the PA.  Ref# for call is [id number].  I spoke to [place] regarding the PA and she states a completed form along with clinical notes will be required to initiate PA.  Form completed and faxed.

## 2019-12-07 NOTE — Patient Instructions (Signed)
Pre-Operative Instructions  Congratulations, you have decided to take an important step towards improving your quality of life.  You can be assured that the doctors and staff at [facility] will be with you every step of the way.  Here are some important things you should know:  1. Plan to be at the surgery center/hospital at least 1 (one) hour prior to your scheduled time, unless otherwise directed by the surgical center/hospital staff.  You must have a responsible adult accompany you, remain during the surgery and drive you home.  Make sure you have directions to the surgical center/hospital to ensure you arrive on time. 2. If you are having surgery at [facility] or  hospitals, you will need a copy of your medical history and physical form from your family physician within one month prior to the date of surgery. We will give you a form for your primary physician to complete.  3. We make every effort to accommodate the date you request for surgery.  However, there are times where surgery dates or times have to be moved.  We will contact you as soon as possible if a change in schedule is required.   4. No aspirin/ibuprofen for one week before surgery.  If you are on aspirin, any non-steroidal anti-inflammatory medications (Mobic, Aleve, Ibuprofen) should not be taken seven (7) days prior to your surgery.  You make take Tylenol for pain prior to surgery.  5. Medications - If you are taking daily heart and blood pressure medications, seizure, reflux, allergy, asthma, anxiety, pain or diabetes medications, make sure you notify the surgery center/hospital before the day of surgery so they can tell you which medications you should take or avoid the day of surgery. 6. No food or drink after midnight the night before surgery unless directed otherwise by surgical center/hospital staff. 7. No alcoholic beverages 24-hours prior to surgery.  No smoking 24-hours prior or 24-hours after  surgery. 8. Wear loose pants or shorts. They should be loose enough to fit over bandages, boots, and casts. 9. Don't wear slip-on shoes. Sneakers are preferred. 10. Bring your boot with you to the surgery center/hospital.  Also bring crutches or a walker if your physician has prescribed it for you.  If you do not have this equipment, it will be provided for you after surgery. 11. If you have not been contacted by the surgery center/hospital by the day before your surgery, call to confirm the date and time of your surgery. 12. Leave-time from work may vary depending on the type of surgery you have.  Appropriate arrangements should be made prior to surgery with your employer. 13. Prescriptions will be provided immediately following surgery by your doctor.  Fill these as soon as possible after surgery and take the medication as directed. Pain medications will not be refilled on weekends and must be approved by the doctor. 14. Remove nail polish on the operative foot and avoid getting pedicures prior to surgery. 15. Wash the night before surgery.  The night before surgery wash the foot and leg well with water and the antibacterial soap provided. Be sure to pay special attention to beneath the toenails and in between the toes.  Wash for at least three (3) minutes. Rinse thoroughly with water and dry well with a towel.  Perform this wash unless told not to do so by your physician.  Enclosed: 1 Ice pack (please put in freezer the night before surgery)   1 Hibiclens skin cleaner     Pre-op instructions  If you have any questions regarding the instructions, please do not hesitate to call our office.  [place]: [address], [place], [place] [address] -- [phone]  [place]: [address] [place], [place] [address] -- [phone]  [place]: [address], [place],  [address] -- [phone]   Website: [email or web address] 

## 2019-12-08 NOTE — Telephone Encounter (Signed)
I called Rosann Auerbach [phone] and spoke to [place].  He states that PA is still pending.  This can take 7-10 days.  Pending approval number is [id number].  Ref# for this call is [id number].   I called patient to let her know that we need to cancel her appointment until we get the approval.  I will call her once we have it in hand to reschedule.

## 2019-12-08 NOTE — Telephone Encounter (Signed)
Ref# for call with Ladene Artist was entered wrong.  Correct Ref# is [phone]

## 2019-12-12 NOTE — Telephone Encounter (Signed)
Botox approval received via fax.  PA# [id number]. F8421: botox 155 units injected every 3 month for 6 months; Botox 200 units injected every 3 months for 1 year; 03128 is included in the PA. -155 units per dose for a total of 620 units- Servicing Provider: Shawnie Dapper.  Valid from 12/07/2019-10/03-2020.  I called Rosann Auerbach [phone] and spoke to [facility] because the CPT code on the faxed letter has the CPT Code wrong.  They have [phone] and it should be [phone].  She corrected this and will fax the corrected letter to [place].  Ref# for this call is [phone] AMPST.  Corrected fax received. I called [facility] to schedule medication delivery.  I spoke to [place] who states there is no PA for the pharmacy.  I will initiate.

## 2019-12-13 ENCOUNTER — Ambulatory Visit: Payer: Managed Care, Other (non HMO) | Admitting: Family Medicine

## 2019-12-22 ENCOUNTER — Encounter: Payer: Self-pay | Admitting: Family Medicine

## 2019-12-22 NOTE — Telephone Encounter (Signed)
I called [facility] to check status of PA.  I spoke to [place] she states it is still pending.  She will reach out to the PA department to find out why it is still pending.  After a 24 minute hold the call was disconnected. I will call back Monday.

## 2019-12-23 ENCOUNTER — Telehealth: Payer: Self-pay

## 2019-12-23 NOTE — Telephone Encounter (Signed)
DOS 01/13/2020  [place] BUNIONECTOMY ([place]) RT - [phone]  [employer] EFFECTIVE DATE - 09/01/2017  PLAN DEDUCTIBLE - $750 W/ $731.94 MET OUT OF POCKET - $2500 W/ $1140.04 MET COPAY $0.00 COINSURANCE - 90%  PER AUTOMATED SYSTEM NOT PRECERT REQUIRED FOR CPT 28296. REF # [id number]

## 2019-12-26 NOTE — Telephone Encounter (Signed)
I called Janet Gallegos to let her know we FINALLY have the approval from the [facility] and to expect a call to give consent.  I scheduled her for the first available on Jan 02, 2020 at 1:00PM. I got her voicemail and left a message.  I called [facility] [phone] and spoke to [place] to give them the PA Ref# [id number] Valid until 03/26/2020.  He is aware that patient is scheduled to be seen on Monday Jan 02, 2020. After a 32 minute hold the call went back to the prompts.  The call was answered by [facility].  Scheduled medication delivery for Thursday April 29,2021.

## 2019-12-26 NOTE — Telephone Encounter (Signed)
I called [facility] [phone] to check status of prescription and try to schedule delivery.  I spoke to [place].  She states that an additional PA is needed for the pharmacy.  States the authorization we have is for Medical only.  She transferred me to [phone] to try to obtain the Pharmacy PA again. I spoke to [place].  She states she does not see the request we faxed to them on 12/12/2019.  We will start a stat verbal request.  PA obtained with [place].  PA Ref# is [id number] valid until 03/26/2020.  States she will fax the approval to [place] as well

## 2020-01-02 ENCOUNTER — Ambulatory Visit: Payer: Managed Care, Other (non HMO) | Admitting: Family Medicine

## 2020-01-02 ENCOUNTER — Other Ambulatory Visit: Payer: Self-pay

## 2020-01-02 DIAGNOSIS — G43019 Migraine without aura, intractable, without status migrainosus: Secondary | ICD-10-CM | POA: Diagnosis not present

## 2020-01-02 NOTE — Progress Notes (Signed)
Botox-100unitsx2 vials Lot: [id number] Expiration: 07/023 NDC: [id number]  0.9% Sodium Chloride- 70mL total   Dx: H21.975 Specialty Pharmacy

## 2020-01-02 NOTE — Progress Notes (Signed)
Last Botox procedure in 07/2019. She had difficulty with insurance approval. She has noted an increase in intensity and frequency of migraines. Rizatriptan helps with abortive therapy.   Consent Form Botulism Toxin Injection For Chronic Migraine    Reviewed orally with patient, additionally signature is on file:  Botulism toxin has been approved by the Federal drug administration for treatment of chronic migraine. Botulism toxin does not cure chronic migraine and it may not be effective in some patients.  The administration of botulism toxin is accomplished by injecting a small amount of toxin into the muscles of the neck and head. Dosage must be titrated for each individual. Any benefits resulting from botulism toxin tend to wear off after 3 months with a repeat injection required if benefit is to be maintained. Injections are usually done every 3-4 months with maximum effect peak achieved by about 2 or 3 weeks. Botulism toxin is expensive and you should be sure of what costs you will incur resulting from the injection.  The side effects of botulism toxin use for chronic migraine may include:   -Transient, and usually mild, facial weakness with facial injections  -Transient, and usually mild, head or neck weakness with head/neck injections  -Reduction or loss of forehead facial animation due to forehead muscle weakness  -Eyelid drooping  -Dry eye  -Pain at the site of injection or bruising at the site of injection  -Double vision  -Potential unknown long term risks   Contraindications: You should not have Botox if you are pregnant, nursing, allergic to albumin, have an infection, skin condition, or muscle weakness at the site of the injection, or have myasthenia gravis, Lambert-Eaton syndrome, or ALS.  It is also possible that as with any injection, there may be an allergic reaction or no effect from the medication. Reduced effectiveness after repeated injections is sometimes seen and  rarely infection at the injection site may occur. All care will be taken to prevent these side effects. If therapy is given over a long time, atrophy and wasting in the muscle injected may occur. Occasionally the patient's become refractory to treatment because they develop antibodies to the toxin. In this event, therapy needs to be modified.  I have read the above information and consent to the administration of botulism toxin.    BOTOX PROCEDURE NOTE FOR MIGRAINE HEADACHE  Contraindications and precautions discussed with patient(above). Aseptic procedure was observed and patient tolerated procedure. Procedure performed by Shawnie Dapper, FNP-C.   The condition has existed for more than 6 months, and pt does not have a diagnosis of ALS, Myasthenia Gravis or Lambert-Eaton Syndrome.  Risks and benefits of injections discussed and pt agrees to proceed with the procedure.  Written consent obtained  These injections are medically necessary. Pt  receives good benefits from these injections. These injections do not cause sedations or hallucinations which the oral therapies may cause.   Description of procedure:  The patient was placed in a sitting position. The standard protocol was used for Botox as follows, with 5 units of Botox injected at each site:  -Procerus muscle, midline injection  -Corrugator muscle, bilateral injection  -Frontalis muscle, bilateral injection, with 2 sites each side, medial injection was performed in the upper one third of the frontalis muscle, in the region vertical from the medial inferior edge of the superior orbital rim. The lateral injection was again in the upper one third of the forehead vertically above the lateral limbus of the cornea, 1.5 cm lateral to the  medial injection site.  -Temporalis muscle injection, 4 sites, bilaterally. The first injection was 3 cm above the tragus of the ear, second injection site was 1.5 cm to 3 cm up from the first injection site in  line with the tragus of the ear. The third injection site was 1.5-3 cm forward between the first 2 injection sites. The fourth injection site was 1.5 cm posterior to the second injection site. 5th site laterally in the temporalis  muscleat the level of the outer canthus.  -Occipitalis muscle injection, 3 sites, bilaterally. The first injection was done one half way between the occipital protuberance and the tip of the mastoid process behind the ear. The second injection site was done lateral and superior to the first, 1 fingerbreadth from the first injection. The third injection site was 1 fingerbreadth superiorly and medially from the first injection site.  -Cervical paraspinal muscle injection, 2 sites, bilaterally. The first injection site was 1 cm from the midline of the cervical spine, 3 cm inferior to the lower border of the occipital protuberance. The second injection site was 1.5 cm superiorly and laterally to the first injection site.  -Trapezius muscle injection was performed at 3 sites, bilaterally. The first injection site was in the upper trapezius muscle halfway between the inflection point of the neck, and the acromion. The second injection site was one half way between the acromion and the first injection site. The third injection was done between the first injection site and the inflection point of the neck.   Will return for repeat injection in 3 months.   A total of 200 units of Botox was prepared, 155 units of Botox was injected as documented above, any Botox not injected was wasted. The patient tolerated the procedure well, there were no complications of the above procedure.

## 2020-01-05 ENCOUNTER — Encounter: Payer: Self-pay | Admitting: Family Medicine

## 2020-01-05 MED ORDER — DEXAMETHASONE 2 MG PO TABS: ORAL_TABLET | ORAL | 0 refills | Status: DC

## 2020-01-12 ENCOUNTER — Other Ambulatory Visit: Payer: Self-pay | Admitting: Podiatry

## 2020-01-12 MED ORDER — ONDANSETRON HCL 4 MG PO TABS: 4.0000 mg | ORAL_TABLET | Freq: Three times a day (TID) | ORAL | 0 refills | Status: DC | PRN

## 2020-01-12 MED ORDER — HYDROCODONE-ACETAMINOPHEN 10-325 MG PO TABS: 1.0000 | ORAL_TABLET | Freq: Four times a day (QID) | ORAL | 0 refills | Status: DC | PRN

## 2020-01-12 MED ORDER — CEPHALEXIN 500 MG PO CAPS: 500.0000 mg | ORAL_CAPSULE | Freq: Three times a day (TID) | ORAL | 0 refills | Status: DC

## 2020-01-13 DIAGNOSIS — M19071 Primary osteoarthritis, right ankle and foot: Secondary | ICD-10-CM

## 2020-01-16 ENCOUNTER — Other Ambulatory Visit: Payer: Self-pay | Admitting: *Deleted

## 2020-01-16 NOTE — Telephone Encounter (Signed)
"  I wanted to discuss some of the complications I had this weekend due to my foot surgery.  I just want to go over them and make sure I don't need to do anything else.  I had a rough time after my surgery."

## 2020-01-16 NOTE — Telephone Encounter (Signed)
I am returning your call.  How can I help you?  "Friday night, the nerve block started to wear off.  I took a pain pill.  A half hour later it wore off so I took another pain pill.  This continued on until Saturday.  I had a friend come over to help take care of my dogs.  I took 2 pills every 5 hours.  I called Sunday morning and was told to remove the ace bandage.  It seems to help a little bit.  It hurts to put weight on it.  The pain pills are not helping as much.  I started taking the Diclofenac with the pain medication.  I am going to run out of pain pills before the end of the week.  I had a horrible weekend.  I want to make sure it was normal."

## 2020-01-17 NOTE — Telephone Encounter (Signed)
Please make sure she has enough pain medication.

## 2020-01-18 MED ORDER — HYDROCODONE-ACETAMINOPHEN 10-325 MG PO TABS: 1.0000 | ORAL_TABLET | Freq: Four times a day (QID) | ORAL | 0 refills | Status: AC | PRN

## 2020-01-18 NOTE — Addendum Note (Signed)
Addended by: Lottie Rater E on: 01/18/2020 02:59 PM   Modules accepted: Orders

## 2020-01-18 NOTE — Telephone Encounter (Signed)
Patient states pain is better today. She has been taking sometimes 2 pills at a time and feels by next week she might need a refill. I will send in that refill for her to [facility] as requested.

## 2020-01-24 ENCOUNTER — Encounter: Payer: Self-pay | Admitting: Podiatry

## 2020-01-25 ENCOUNTER — Other Ambulatory Visit: Payer: Self-pay

## 2020-01-25 ENCOUNTER — Ambulatory Visit (INDEPENDENT_AMBULATORY_CARE_PROVIDER_SITE_OTHER): Payer: Managed Care, Other (non HMO)

## 2020-01-25 ENCOUNTER — Ambulatory Visit (INDEPENDENT_AMBULATORY_CARE_PROVIDER_SITE_OTHER): Payer: Managed Care, Other (non HMO) | Admitting: Podiatry

## 2020-01-25 ENCOUNTER — Encounter: Payer: Self-pay | Admitting: Podiatry

## 2020-01-25 VITALS — BP 111/69 | HR 85 | Temp 97.9°F

## 2020-01-25 DIAGNOSIS — M2011 Hallux valgus (acquired), right foot: Secondary | ICD-10-CM

## 2020-01-25 DIAGNOSIS — Z9889 Other specified postprocedural states: Secondary | ICD-10-CM

## 2020-01-25 NOTE — Progress Notes (Signed)
She presents today for her first postop visit date of surgery 01/13/2020 status post Vassie Moselle Zwick osteotomy with screw fixation states that is doing better it was very painful last week and someone from the office had told me to remove the wrap which I did not feel so much better after that.  Objective: Vital signs are stable alert oriented x3. Pulses are palpable. There is minimal edema no erythema cellulitis drainage or odor suture lines appear to be intact and sutures are intact. We went ahead and remove the sutures today. Margins remained intact she has great range of motion of the toe.  Radiographs taken today demonstrate a slightly shortened first metatarsal and good position of internal fixation with screw in good position and without motion. The toe is sitting rectus.  Assessment: Well-healing surgical toe right.  Plan: Remove sutures today placed the foot in a compression anklet and a Darco shoe. She will well wear the shoe at home but if she goes out she knows to wear that the cam boot for safety.

## 2020-02-06 ENCOUNTER — Encounter: Payer: Self-pay | Admitting: Podiatry

## 2020-02-06 ENCOUNTER — Other Ambulatory Visit: Payer: Self-pay

## 2020-02-06 ENCOUNTER — Ambulatory Visit (INDEPENDENT_AMBULATORY_CARE_PROVIDER_SITE_OTHER): Payer: Managed Care, Other (non HMO) | Admitting: Podiatry

## 2020-02-06 DIAGNOSIS — Z9889 Other specified postprocedural states: Secondary | ICD-10-CM

## 2020-02-06 DIAGNOSIS — M2011 Hallux valgus (acquired), right foot: Secondary | ICD-10-CM

## 2020-02-06 NOTE — Progress Notes (Signed)
She presents today's postop visit #2 date of surgery 01/13/2020 Janet Gallegos osteotomy with screw fixation right.  States that is doing great I did bump it on my shoe and that did not feel too good the next day but all in all I am okay now.  Objective: Vital signs are stable alert oriented x3.  There is no erythema just mild edema no cellulitis drainage odor she has great range of motion scar tissue is negligible.  Assessment: Well-healing surgical foot.  Plan: Will allow her to get back into tennis shoes starting next Monday.  I will follow-up with her in 2 weeks.

## 2020-02-20 ENCOUNTER — Ambulatory Visit (INDEPENDENT_AMBULATORY_CARE_PROVIDER_SITE_OTHER): Payer: Managed Care, Other (non HMO) | Admitting: Podiatry

## 2020-02-20 ENCOUNTER — Other Ambulatory Visit: Payer: Self-pay

## 2020-02-20 ENCOUNTER — Ambulatory Visit (INDEPENDENT_AMBULATORY_CARE_PROVIDER_SITE_OTHER): Payer: Managed Care, Other (non HMO)

## 2020-02-20 ENCOUNTER — Encounter: Payer: Self-pay | Admitting: Podiatry

## 2020-02-20 DIAGNOSIS — M2011 Hallux valgus (acquired), right foot: Secondary | ICD-10-CM

## 2020-02-20 DIAGNOSIS — Z9889 Other specified postprocedural states: Secondary | ICD-10-CM

## 2020-02-20 NOTE — Progress Notes (Signed)
She presents today for a postop visit date of surgery Jan 13, 2020 status post Janet Gallegos osteotomy right foot states that is doing really doing well it only bothers me if it swells if I been on it too much.  Denies fever chills nausea vomiting muscle aches pains.  Objective: Vital signs are stable she is alert oriented x3 presents with her tennis shoes today.  Once removed demonstrates mild edema no erythema cellulitis drainage or odor.  Great range of motion dorsiflexion plantarflexion she has a well-healing scar.  Radiographs taken today demonstrate well healing osteotomy internal fixation is in good position and has not moved.  Assessment: Well-healing surgical foot date of surgery 01/13/2020.  Plan: I will to follow-up with her in 1 month at which time we will have Raiford Noble to see get her a set of orthotics made and I will also follow-up with her for her final postop visit.

## 2020-03-07 ENCOUNTER — Encounter: Payer: Self-pay | Admitting: Podiatry

## 2020-03-07 ENCOUNTER — Encounter: Payer: Managed Care, Other (non HMO) | Admitting: Podiatry

## 2020-03-07 ENCOUNTER — Telehealth: Payer: Self-pay | Admitting: Podiatry

## 2020-03-07 MED ORDER — DICLOFENAC SODIUM 75 MG PO TBEC: 75.0000 mg | DELAYED_RELEASE_TABLET | Freq: Two times a day (BID) | ORAL | 3 refills | Status: DC

## 2020-03-07 NOTE — Telephone Encounter (Signed)
Pt would like a script for anti inflammatory olease assist

## 2020-03-15 ENCOUNTER — Telehealth: Payer: Self-pay | Admitting: Family Medicine

## 2020-03-15 NOTE — Telephone Encounter (Signed)
Patient has a Botox appointment on 8/4. I called [facility] and spoke with Lawson Fiscal to schedule delivery of Botox. She scheduled delivery for 7/21. (2) 100U vials of Botox. Lawson Fiscal states this is patient's last refill, so for next injection, a new prescription will need to be given. She also let me know that patient has PA with Rosann Auerbach but it expires on 7/26. I will initiate a new PA.

## 2020-03-21 ENCOUNTER — Ambulatory Visit (INDEPENDENT_AMBULATORY_CARE_PROVIDER_SITE_OTHER): Payer: Managed Care, Other (non HMO)

## 2020-03-21 ENCOUNTER — Other Ambulatory Visit: Payer: Self-pay

## 2020-03-21 ENCOUNTER — Ambulatory Visit (INDEPENDENT_AMBULATORY_CARE_PROVIDER_SITE_OTHER): Payer: Managed Care, Other (non HMO) | Admitting: Podiatry

## 2020-03-21 ENCOUNTER — Encounter: Payer: Self-pay | Admitting: Podiatry

## 2020-03-21 ENCOUNTER — Ambulatory Visit (INDEPENDENT_AMBULATORY_CARE_PROVIDER_SITE_OTHER): Payer: Managed Care, Other (non HMO) | Admitting: Orthotics

## 2020-03-21 DIAGNOSIS — M19072 Primary osteoarthritis, left ankle and foot: Secondary | ICD-10-CM | POA: Diagnosis not present

## 2020-03-21 DIAGNOSIS — M2011 Hallux valgus (acquired), right foot: Secondary | ICD-10-CM | POA: Diagnosis not present

## 2020-03-21 DIAGNOSIS — M19071 Primary osteoarthritis, right ankle and foot: Secondary | ICD-10-CM

## 2020-03-21 DIAGNOSIS — Z9889 Other specified postprocedural states: Secondary | ICD-10-CM

## 2020-03-21 NOTE — Progress Notes (Signed)
She presents today date of surgery 01/13/2020 status post Vassie Moselle Zwick osteotomy states that he hit my toe on July 4 and it was really sore for a while now feels much better but I have a lot of popping in the toe.  Objective: Vital signs are stable alert and oriented x3.  Pulses are palpable.  There is no erythema edema cellulitis drainage or odor.  She has good range of motion of the first metatarsophalangeal joint.  Incision site is gone to heal uneventfully.  Radiographs taken today demonstrate internal fixation in good position does not demonstrate any type of abnormality that would may be associated with injury of the toe.  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  Plan: At this point I am going to allow her to get back to her regular routine and I will follow-up with her once her orthotics come in regard to have recast her today for orthotics.

## 2020-03-21 NOTE — Progress Notes (Signed)
Cast patient today for good foot orthotics to support arch and give forefoot cushioning.

## 2020-03-22 ENCOUNTER — Encounter: Payer: Self-pay | Admitting: Family Medicine

## 2020-03-22 MED ORDER — NURTEC 75 MG PO TBDP
75.0000 mg | ORAL_TABLET | Freq: Every day | ORAL | 11 refills | Status: DC | PRN
Start: 2020-03-22 — End: 2020-04-04

## 2020-03-22 NOTE — Telephone Encounter (Signed)
Botox delivered from [facility] 7/21.

## 2020-03-27 ENCOUNTER — Telehealth: Payer: Self-pay | Admitting: *Deleted

## 2020-03-27 NOTE — Telephone Encounter (Signed)
I called [facility] [phone]) and spoke with Candace to initiate PA for Botox. Authorization #[id number] (03/27/20- 06/27/20). I called [employer] and spoke with [facility] regarding PA. She states patient has PA on file with [employer]. PA #[id number] (12/07/19- 06/07/20)

## 2020-03-27 NOTE — Telephone Encounter (Signed)
Doing CMM for nurtec.  Has how many headaches per month?

## 2020-03-29 NOTE — Telephone Encounter (Signed)
They have denied nurtec for this pt wanting her to try another triptan.

## 2020-04-02 MED ORDER — ELETRIPTAN HYDROBROMIDE 40 MG PO TABS: 40.0000 mg | ORAL_TABLET | ORAL | 11 refills | Status: DC | PRN

## 2020-04-02 NOTE — Addendum Note (Signed)
Addended byNicholas Lose, Jetta Murray L on: 04/02/2020 11:05 AM   Modules accepted: Orders

## 2020-04-04 ENCOUNTER — Ambulatory Visit: Payer: Managed Care, Other (non HMO) | Admitting: Family Medicine

## 2020-04-04 DIAGNOSIS — G43019 Migraine without aura, intractable, without status migrainosus: Secondary | ICD-10-CM | POA: Diagnosis not present

## 2020-04-04 NOTE — Progress Notes (Signed)
Botox-100unitsx2 vials Lot: [id number] Expiration: 07/2022 NDC: 1683-7290-21   0.9% Sodium Chloride- 95mL total Lot: [id number] Expiration: 06/2022 NDC: 02233-612-24  Dx: MIGRAINES SP  Consent signed

## 2020-04-04 NOTE — Progress Notes (Signed)
She has had an intractable headache for 3 weeks. She has tried rizatriptan, eletriptan (caused burning) and Aleve with minimal relief. Nurtec called in but denies. She has tried and failed sumatriptan in the past. We will resubmit for Nurtec. Usually has 1-2 migraines per month.   Consent Form Botulism Toxin Injection For Chronic Migraine    Reviewed orally with patient, additionally signature is on file:  Botulism toxin has been approved by the Federal drug administration for treatment of chronic migraine. Botulism toxin does not cure chronic migraine and it may not be effective in some patients.  The administration of botulism toxin is accomplished by injecting a small amount of toxin into the muscles of the neck and head. Dosage must be titrated for each individual. Any benefits resulting from botulism toxin tend to wear off after 3 months with a repeat injection required if benefit is to be maintained. Injections are usually done every 3-4 months with maximum effect peak achieved by about 2 or 3 weeks. Botulism toxin is expensive and you should be sure of what costs you will incur resulting from the injection.  The side effects of botulism toxin use for chronic migraine may include:   -Transient, and usually mild, facial weakness with facial injections  -Transient, and usually mild, head or neck weakness with head/neck injections  -Reduction or loss of forehead facial animation due to forehead muscle weakness  -Eyelid drooping  -Dry eye  -Pain at the site of injection or bruising at the site of injection  -Double vision  -Potential unknown long term risks   Contraindications: You should not have Botox if you are pregnant, nursing, allergic to albumin, have an infection, skin condition, or muscle weakness at the site of the injection, or have myasthenia gravis, Lambert-Eaton syndrome, or ALS.  It is also possible that as with any injection, there may be an allergic reaction or no  effect from the medication. Reduced effectiveness after repeated injections is sometimes seen and rarely infection at the injection site may occur. All care will be taken to prevent these side effects. If therapy is given over a long time, atrophy and wasting in the muscle injected may occur. Occasionally the patient's become refractory to treatment because they develop antibodies to the toxin. In this event, therapy needs to be modified.  I have read the above information and consent to the administration of botulism toxin.    BOTOX PROCEDURE NOTE FOR MIGRAINE HEADACHE  Contraindications and precautions discussed with patient(above). Aseptic procedure was observed and patient tolerated procedure. Procedure performed by Shawnie Dapper, FNP-C.   The condition has existed for more than 6 months, and pt does not have a diagnosis of ALS, Myasthenia Gravis or Lambert-Eaton Syndrome.  Risks and benefits of injections discussed and pt agrees to proceed with the procedure.  Written consent obtained  These injections are medically necessary. Pt  receives good benefits from these injections. These injections do not cause sedations or hallucinations which the oral therapies may cause.   Description of procedure:  The patient was placed in a sitting position. The standard protocol was used for Botox as follows, with 5 units of Botox injected at each site:  -Procerus muscle, midline injection  -Corrugator muscle, bilateral injection  -Frontalis muscle, bilateral injection, with 2 sites each side, medial injection was performed in the upper one third of the frontalis muscle, in the region vertical from the medial inferior edge of the superior orbital rim. The lateral injection was again in the  upper one third of the forehead vertically above the lateral limbus of the cornea, 1.5 cm lateral to the medial injection site.  -Temporalis muscle injection, 4 sites, bilaterally. The first injection was 3 cm above the  tragus of the ear, second injection site was 1.5 cm to 3 cm up from the first injection site in line with the tragus of the ear. The third injection site was 1.5-3 cm forward between the first 2 injection sites. The fourth injection site was 1.5 cm posterior to the second injection site. 5th site laterally in the temporalis  muscleat the level of the outer canthus.  -Occipitalis muscle injection, 3 sites, bilaterally. The first injection was done one half way between the occipital protuberance and the tip of the mastoid process behind the ear. The second injection site was done lateral and superior to the first, 1 fingerbreadth from the first injection. The third injection site was 1 fingerbreadth superiorly and medially from the first injection site.  -Cervical paraspinal muscle injection, 2 sites, bilaterally. The first injection site was 1 cm from the midline of the cervical spine, 3 cm inferior to the lower border of the occipital protuberance. The second injection site was 1.5 cm superiorly and laterally to the first injection site.  -Trapezius muscle injection was performed at 3 sites, bilaterally. The first injection site was in the upper trapezius muscle halfway between the inflection point of the neck, and the acromion. The second injection site was one half way between the acromion and the first injection site. The third injection was done between the first injection site and the inflection point of the neck.   Will return for repeat injection in 3 months.   A total of 200 units of Botox was prepared, 155 units of Botox was injected as documented above, any Botox not injected was wasted. The patient tolerated the procedure well, there were no complications of the above procedure.

## 2020-04-06 ENCOUNTER — Telehealth: Payer: Self-pay | Admitting: *Deleted

## 2020-04-06 NOTE — Telephone Encounter (Signed)
I called and left the patient a message that her orthotics are here.  I asked her to call and schedule an appointment.

## 2020-04-11 ENCOUNTER — Telehealth: Payer: Self-pay | Admitting: *Deleted

## 2020-04-11 NOTE — Telephone Encounter (Signed)
Resubmitted to [facility] [id number] for Nurtec 75mg .  Determination pending.

## 2020-04-12 NOTE — Telephone Encounter (Signed)
Received approval [id number]. nurtec 75mg  thru 07-12-2020. [facility] [phone].

## 2020-05-16 ENCOUNTER — Ambulatory Visit (INDEPENDENT_AMBULATORY_CARE_PROVIDER_SITE_OTHER): Payer: Managed Care, Other (non HMO) | Admitting: Orthotics

## 2020-05-16 ENCOUNTER — Other Ambulatory Visit: Payer: Self-pay

## 2020-05-16 DIAGNOSIS — M2011 Hallux valgus (acquired), right foot: Secondary | ICD-10-CM

## 2020-05-16 DIAGNOSIS — Z9889 Other specified postprocedural states: Secondary | ICD-10-CM

## 2020-05-16 NOTE — Progress Notes (Signed)
Patient came in today to pick up custom made foot orthotics.  The goals were accomplished and the patient reported no dissatisfaction with said orthotics.  Patient was advised of breakin period and how to report any issues. 

## 2020-05-25 NOTE — Telephone Encounter (Signed)
Orthotics were picked up on 05/16/2020.

## 2020-06-18 DIAGNOSIS — M7711 Lateral epicondylitis, right elbow: Secondary | ICD-10-CM | POA: Insufficient documentation

## 2020-06-26 NOTE — Progress Notes (Signed)
PA submitted for Nurtec 75mg  on Cover my Meds. There is a 24/72hr turn around. Key Status: PENDING  Information has been sent to [facility].

## 2020-06-27 NOTE — Progress Notes (Signed)
Message from Plan This medication or product was previously approved on [id number] from 2020-04-11 to 2020-07-12. You will be able to fill a prescription for this medication at your pharmacy. If your pharmacy has questions regarding the processing of your prescription, please have them call the [facility] pharmacy help desk at [phone].

## 2020-07-03 ENCOUNTER — Telehealth: Payer: Self-pay | Admitting: Family Medicine

## 2020-07-03 NOTE — Telephone Encounter (Signed)
I received form, filled it out and gave to Amy to sign. I have faxed it back to [place].

## 2020-07-03 NOTE — Telephone Encounter (Signed)
Patient has a Botox appointment on 11/23. Her Botox PA with [employer] expired in October. I called [facility] and spoke with Greig Castilla. He states that he will fax a form to me to fill out and fax back to [phone]).

## 2020-07-10 NOTE — Telephone Encounter (Addendum)
Received PA approval from [facility] via fax. PA #[id number] (07/10/20- 07/10/21). Patient will be required to use [facility], [employer]'s preferred specialty pharmacy. I filled out [facility]'s pharmacy enrollment form and faxed it to them with patient's insurance card and [employer] Botox approval letter.

## 2020-07-17 NOTE — Telephone Encounter (Signed)
I called [facility] and spoke with Rosanne Ashing to schedule Botox delivery. Botox TBD 11/18.

## 2020-07-19 NOTE — Telephone Encounter (Signed)
(  1) 200U vial of Botox delivered today from [facility]. 

## 2020-07-23 NOTE — Progress Notes (Signed)
She is doing well. Migraines are well managed. She has received Nurtec and it is working well. She was recently started on Vyvanse for ADHD> No worsening in migraines thus far.   Consent Form Botulism Toxin Injection For Chronic Migraine    Reviewed orally with patient, additionally signature is on file:  Botulism toxin has been approved by the Federal drug administration for treatment of chronic migraine. Botulism toxin does not cure chronic migraine and it may not be effective in some patients.  The administration of botulism toxin is accomplished by injecting a small amount of toxin into the muscles of the neck and head. Dosage must be titrated for each individual. Any benefits resulting from botulism toxin tend to wear off after 3 months with a repeat injection required if benefit is to be maintained. Injections are usually done every 3-4 months with maximum effect peak achieved by about 2 or 3 weeks. Botulism toxin is expensive and you should be sure of what costs you will incur resulting from the injection.  The side effects of botulism toxin use for chronic migraine may include:   -Transient, and usually mild, facial weakness with facial injections  -Transient, and usually mild, head or neck weakness with head/neck injections  -Reduction or loss of forehead facial animation due to forehead muscle weakness  -Eyelid drooping  -Dry eye  -Pain at the site of injection or bruising at the site of injection  -Double vision  -Potential unknown long term risks   Contraindications: You should not have Botox if you are pregnant, nursing, allergic to albumin, have an infection, skin condition, or muscle weakness at the site of the injection, or have myasthenia gravis, Lambert-Eaton syndrome, or ALS.  It is also possible that as with any injection, there may be an allergic reaction or no effect from the medication. Reduced effectiveness after repeated injections is sometimes seen and rarely  infection at the injection site may occur. All care will be taken to prevent these side effects. If therapy is given over a long time, atrophy and wasting in the muscle injected may occur. Occasionally the patient's become refractory to treatment because they develop antibodies to the toxin. In this event, therapy needs to be modified.  I have read the above information and consent to the administration of botulism toxin.    BOTOX PROCEDURE NOTE FOR MIGRAINE HEADACHE  Contraindications and precautions discussed with patient(above). Aseptic procedure was observed and patient tolerated procedure. Procedure performed by Shawnie Dapper, FNP-C.   The condition has existed for more than 6 months, and pt does not have a diagnosis of ALS, Myasthenia Gravis or Lambert-Eaton Syndrome.  Risks and benefits of injections discussed and pt agrees to proceed with the procedure.  Written consent obtained  These injections are medically necessary. Pt  receives good benefits from these injections. These injections do not cause sedations or hallucinations which the oral therapies may cause.   Description of procedure:  The patient was placed in a sitting position. The standard protocol was used for Botox as follows, with 5 units of Botox injected at each site:  -Procerus muscle, midline injection  -Corrugator muscle, bilateral injection  -Frontalis muscle, bilateral injection, with 2 sites each side, medial injection was performed in the upper one third of the frontalis muscle, in the region vertical from the medial inferior edge of the superior orbital rim. The lateral injection was again in the upper one third of the forehead vertically above the lateral limbus of the cornea, 1.5  cm lateral to the medial injection site.  -Temporalis muscle injection, 4 sites, bilaterally. The first injection was 3 cm above the tragus of the ear, second injection site was 1.5 cm to 3 cm up from the first injection site in line with  the tragus of the ear. The third injection site was 1.5-3 cm forward between the first 2 injection sites. The fourth injection site was 1.5 cm posterior to the second injection site. 5th site laterally in the temporalis  muscleat the level of the outer canthus.  -Occipitalis muscle injection, 3 sites, bilaterally. The first injection was done one half way between the occipital protuberance and the tip of the mastoid process behind the ear. The second injection site was done lateral and superior to the first, 1 fingerbreadth from the first injection. The third injection site was 1 fingerbreadth superiorly and medially from the first injection site.  -Cervical paraspinal muscle injection, 2 sites, bilaterally. The first injection site was 1 cm from the midline of the cervical spine, 3 cm inferior to the lower border of the occipital protuberance. The second injection site was 1.5 cm superiorly and laterally to the first injection site.  -Trapezius muscle injection was performed at 3 sites, bilaterally. The first injection site was in the upper trapezius muscle halfway between the inflection point of the neck, and the acromion. The second injection site was one half way between the acromion and the first injection site. The third injection was done between the first injection site and the inflection point of the neck.   Will return for repeat injection in 3 months.   A total of 200 units of Botox was prepared, 155 units of Botox was injected as documented above, any Botox not injected was wasted. The patient tolerated the procedure well, there were no complications of the above procedure.

## 2020-07-24 ENCOUNTER — Ambulatory Visit: Payer: Managed Care, Other (non HMO) | Admitting: Family Medicine

## 2020-07-24 DIAGNOSIS — G43019 Migraine without aura, intractable, without status migrainosus: Secondary | ICD-10-CM | POA: Diagnosis not present

## 2020-07-24 NOTE — Progress Notes (Signed)
Botox-200unitsx1 vials Lot: [id number] Expiration: 01/2023 NDC: 0023   0.9% Sodium Chloride- 61mL total Lot: [id number] Expiration: 06/2022 NDC: 71696-789-38  Dx: Chronic Migraine SP  Consent signed

## 2020-08-15 ENCOUNTER — Telehealth: Payer: Self-pay

## 2020-08-15 NOTE — Telephone Encounter (Addendum)
PA sent via CMM  (Key: [id number]) Nurtec 75MG  dispersible tablets   Form [facility] Electronic Prior Authorization Form (2017 NCPDP)  Pending

## 2020-08-16 NOTE — Telephone Encounter (Signed)
Message from Plan Request Reference Number: [id number]. NURTEC TAB 75MG  ODT is approved through 08/15/2021. Your patient may now fill this prescription and it will be covered.  Faxed to pharmacy

## 2020-09-11 ENCOUNTER — Encounter: Payer: Self-pay | Admitting: Family Medicine

## 2020-10-08 ENCOUNTER — Telehealth: Payer: Self-pay | Admitting: Family Medicine

## 2020-10-08 NOTE — Telephone Encounter (Signed)
Patient has a Botox appointment 2/24. I called [facility] and spoke to [facility] to schedule Botox delivery. Botox TBD 2/15.

## 2020-10-16 NOTE — Telephone Encounter (Signed)
Received (1) 200U vial of Botox today from [facility].

## 2020-10-22 ENCOUNTER — Ambulatory Visit: Payer: Managed Care, Other (non HMO) | Admitting: Obstetrics and Gynecology

## 2020-10-25 ENCOUNTER — Ambulatory Visit: Payer: Managed Care, Other (non HMO) | Admitting: Family Medicine

## 2020-10-25 ENCOUNTER — Other Ambulatory Visit: Payer: Self-pay

## 2020-10-25 ENCOUNTER — Ambulatory Visit
Admission: RE | Admit: 2020-10-25 | Discharge: 2020-10-25 | Disposition: A | Discharge status: Home / Self Care | Payer: Managed Care, Other (non HMO) | Source: Ambulatory Visit | Attending: Obstetrics and Gynecology | Admitting: Obstetrics and Gynecology

## 2020-10-25 ENCOUNTER — Encounter: Payer: Self-pay | Admitting: Family Medicine

## 2020-10-25 ENCOUNTER — Encounter: Payer: Self-pay | Admitting: Obstetrics and Gynecology

## 2020-10-25 ENCOUNTER — Ambulatory Visit: Payer: Managed Care, Other (non HMO) | Admitting: Obstetrics and Gynecology

## 2020-10-25 VITALS — BP 114/73 | Ht 64.0 in | Wt 173.2 lb

## 2020-10-25 DIAGNOSIS — N23 Unspecified renal colic: Secondary | ICD-10-CM | POA: Insufficient documentation

## 2020-10-25 DIAGNOSIS — R350 Frequency of micturition: Secondary | ICD-10-CM

## 2020-10-25 DIAGNOSIS — R102 Pelvic and perineal pain: Secondary | ICD-10-CM

## 2020-10-25 DIAGNOSIS — G43019 Migraine without aura, intractable, without status migrainosus: Secondary | ICD-10-CM

## 2020-10-25 LAB — POCT URINALYSIS DIPSTICK
Bilirubin, UA: NEGATIVE
Blood, UA: POSITIVE
Glucose, UA: NEGATIVE
Ketones, UA: NEGATIVE
Leukocytes, UA: NEGATIVE
Nitrite, UA: NEGATIVE
Protein, UA: NEGATIVE
Spec Grav, UA: >=1.03 — AB
Urobilinogen, UA: 0.2 U/dL
pH, UA: 5

## 2020-10-25 NOTE — Progress Notes (Signed)
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

## 2020-10-25 NOTE — Progress Notes (Signed)
Patient ID: Janet Gallegos, female   DOB: [birth year], 40 y.o.   MRN: [id number]  Reason for Consult: Gynecologic Exam (Pt went to the urgent care and they did a culture. Pt has been taking antibiotics. This happen over the weekend.)   Referred by Ronal Fear, NP  Subjective:     HPI:  Janet Gallegos is a 40 y.o. female who presents with pelvic pressure, increased urinary frequency, and L sided pain flank pain. Patient states that symptoms of "diffuse pressure" when voiding and increased urinary frequency started on Saturday. On Saturday night, patient was awoken with intense flank pad that did not respond to heating pad or Norco (patient had this previously rx'd for another procedure). Patient states the pain suddenly stopped. On Sunday, patient experienced another period of intense L sided flank pain. Patient states that she present to Urgent Care and was treated for a UTI. Patient felt her sx of urinary frequency and pressure when voiding were improving. Yesterday, patient experienced another episode of intense L sided flank pain that subsided rapidly. Patient reports following this episode she when to void and it was very painful to void, as if something was tearing, and then she felt significant improvement. Patient has hx of endometriosis, currently well-controlled with IUD-LNG placement. Patient states that the pain she has been experiencing this week is not consistent with her endometriosis pain at all. Patient reports regular BMs consistent with her usual pattern.   Gynecological History Menopause: n/a LMP: IUD-LNG Describes periods as absent Last pap smear: 07/08/18 - NILM/HPV - Last Mammogram: n/a History of STDs: n/a Sexually Active: no  Obstetrical History G0P0  Past Medical History:  Diagnosis Date  . Acid reflux   . Asthma    prn inhaler  . 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 Common migraine with intractable migraine 09/23/2016  . Complication of anesthesia     states is hard to wake up post-op  . Dental bridge present upper  . Dental crown present    lower  . Dumping syndrome   . Endometriosis   . Family history of adverse reaction to anesthesia    states pt's mother woke up during a surgery  . GERD (gastroesophageal reflux disease)    no current med.  . Migraines    Family History  Problem Relation Age of Onset  . Anesthesia problems Mother        woke up during surgery  . Parkinson's disease Paternal Grandfather   . Migraines Neg Hx    Past Surgical History:  Procedure Laterality Date  . CHONDROPLASTY Right 11/10/2014   Procedure: CHONDROPLASTY;  Surgeon: Marcene Corning, MD;  Location: [facility];  Service: Orthopedics;  Laterality: Right;  . DIAGNOSTIC LAPAROSCOPY     Prior to 2010 x 2  . KNEE ARTHROSCOPY W/ LATERAL RELEASE Right 07/20/2008  . KNEE ARTHROSCOPY WITH FULKERSON SLIDE Right 11/10/2014   Procedure: RIGHT KNEE ARTHROSCOPY WITH Conan Bowens;  Surgeon: Marcene Corning, MD;  Location: [facility];  Service: Orthopedics;  Laterality: Right;  . KNEE ARTHROSCOPY WITH LATERAL RELEASE Right 11/10/2014   Procedure: KNEE ARTHROSCOPY WITH LATERAL RELEASE;  Surgeon: Marcene Corning, MD;  Location: [facility];  Service: Orthopedics;  Laterality: Right;  . SHOULDER ARTHROSCOPY  2020   Left   . TONSILLECTOMY N/A 07/31/2015   Procedure: TONSILLECTOMY;  Surgeon: Geanie [facility], MD;  Location: [facility];  Service: ENT;  Laterality: N/A;  . UPPER GI ENDOSCOPY  Short Social History:  Social History   Tobacco Use  . Smoking status: Former Smoker    Packs/day: 0.50    Years: 4.00    Pack years: 2.00    Types: Cigarettes    Quit date: 01/11/2015    Years since quitting: 5.7  . Smokeless tobacco: Never Used  . Tobacco comment:    Substance Use Topics  . Alcohol use: Yes    Alcohol/week: 1.0 - 2.0 standard drink    Types: 1 - 2 Standard drinks or equivalent per week     Comment: occasionally    Allergies  Allergen Reactions  . Sulfa Antibiotics Nausea And Vomiting  . Percocet [Oxycodone-Acetaminophen] Nausea And Vomiting  . Propranolol     Rash  . Topamax [Topiramate]     Nausea    Current Outpatient Medications  Medication Sig Dispense Refill  . Albuterol Sulfate 108 (90 BASE) MCG/ACT AEPB Inhale into the lungs as needed.     Marland Kitchen AMZEEQ 4 % FOAM     . BOTOX 100 units SOLR injection INJECT 155 UNITS  INTRAMUSCULARLY EVERY 12  WEEKS (GIVEN AT MD OFFICE,  DISCARD UNUSED) 2 each 1  . budesonide-formoterol (SYMBICORT) 160-4.5 MCG/ACT inhaler Inhale 2 puffs into the lungs 2 (two) times daily.    . cetirizine (ZYRTEC) 10 MG tablet Take 10 mg by mouth daily.    Marland Kitchen dexlansoprazole (DEXILANT) 60 MG capsule Take 1 capsule by mouth  daily as needed    . diclofenac (VOLTAREN) 75 MG EC tablet Take 1 tablet (75 mg total) by mouth 2 (two) times daily. 60 tablet 3  . levonorgestrel (MIRENA) 20 MCG/24HR IUD 1 each by Intrauterine route once.    . lisdexamfetamine (VYVANSE) 40 MG capsule Take 40 mg by mouth every morning.    . rizatriptan (MAXALT) 10 MG tablet TAKE 1 TABLET BY MOUTH AT ONSET OF MIGRAINE MAY REPEAT IN 2 HRS IF NEEDED 10 tablet 11  . spironolactone (ALDACTONE) 25 MG tablet Take 25 mg by mouth 2 (two) times daily.    Marland Kitchen dexamethasone (DECADRON) 2 MG tablet Take three tablets on day 1, two tablets on day 2 and 1 tablet on day three. 6 tablet 0  . eletriptan (RELPAX) 40 MG tablet Take 1 tablet (40 mg total) by mouth as needed for migraine or headache. May repeat in 2 hours if headache persists or recurs. 10 tablet 11  . ondansetron (ZOFRAN) 4 MG tablet Take 1 tablet (4 mg total) by mouth every 8 (eight) hours as needed. 20 tablet 0  . RABEprazole (ACIPHEX) 20 MG tablet Take 20 mg by mouth 2 (two) times daily.      No current facility-administered medications for this visit.    Review of Systems  Constitutional:  Constitutional negative. HENT: HENT  negative.  Eyes: Eyes negative.  Respiratory: Respiratory negative.  Cardiovascular: Cardiovascular negative.  GI: Positive for abdominal pain. Negative for nausea and vomiting.  GU: Positive for difficulty urinating and frequency.       L flank pain, pelvic pressure with voiding Musculoskeletal: Musculoskeletal negative.  Skin: Skin negative.  Neurological: Neurological negative. Hematologic: Hematologic/lymphatic negative.  Psychiatric: Psychiatric negative.        Objective:  Objective   Vitals:   10/25/20 0843  BP: 114/73  Weight: 173 lb 3.2 oz (78.6 kg)  Height: 5\' 4"  (1.626 m)   Body mass index is 29.73 kg/m.  Physical Exam Vitals reviewed.  Constitutional:      Appearance: Normal appearance.  HENT:  Head: Normocephalic.     Nose: Nose normal.  Eyes:     Pupils: Pupils are equal, round, and reactive to light.  Pulmonary:     Effort: Pulmonary effort is normal.  Abdominal:     General: Abdomen is flat.     Palpations: Abdomen is soft.     Tenderness: There is no right CVA tenderness or left CVA tenderness.  Musculoskeletal:        General: Normal range of motion.     Cervical back: Normal range of motion.  Skin:    General: Skin is warm and dry.  Neurological:     General: No focal deficit present.     Mental Status: She is alert and oriented to person, place, and time.  Psychiatric:        Mood and Affect: Mood normal.        Behavior: Behavior normal.     Assessment/Plan:     40 yo, G0, presents with sx consistent with renal colic. UA and urinary cx from urgent care visit reviewed - negative for UTI, +hgb in urinary, no growth on cx. UA in office today +hgb, specific gravity >1.030. Plan made for [place] to r/o renal calculus.  Problem List Items Addressed This Visit   None   Visit Diagnoses    Renal colic on left side    -  Primary   Relevant Orders   US RENAL   Pelvic pressure in female       Urinary frequency       Relevant Orders   POCT  Urinalysis Dipstick (Completed)     F/u is sx worsen or persist.    Zipporah Plants, CNM [facility] 10/25/2020 9:25 AM

## 2020-10-25 NOTE — Progress Notes (Signed)
10/25/2020 ALL: She is doing well. Botox continues to be effective in migraine management. Nurtec helps with abortive therapy.   07/24/2020 ALL: She is doing well. Migraines are well managed. She has received Nurtec and it is working well. She was recently started on Vyvanse for ADHD. No worsening in migraines thus far.   Consent Form Botulism Toxin Injection For Chronic Migraine    Reviewed orally with patient, additionally signature is on file:  Botulism toxin has been approved by the Federal drug administration for treatment of chronic migraine. Botulism toxin does not cure chronic migraine and it may not be effective in some patients.  The administration of botulism toxin is accomplished by injecting a small amount of toxin into the muscles of the neck and head. Dosage must be titrated for each individual. Any benefits resulting from botulism toxin tend to wear off after 3 months with a repeat injection required if benefit is to be maintained. Injections are usually done every 3-4 months with maximum effect peak achieved by about 2 or 3 weeks. Botulism toxin is expensive and you should be sure of what costs you will incur resulting from the injection.  The side effects of botulism toxin use for chronic migraine may include:   -Transient, and usually mild, facial weakness with facial injections  -Transient, and usually mild, head or neck weakness with head/neck injections  -Reduction or loss of forehead facial animation due to forehead muscle weakness  -Eyelid drooping  -Dry eye  -Pain at the site of injection or bruising at the site of injection  -Double vision  -Potential unknown long term risks   Contraindications: You should not have Botox if you are pregnant, nursing, allergic to albumin, have an infection, skin condition, or muscle weakness at the site of the injection, or have myasthenia gravis, Lambert-Eaton syndrome, or ALS.  It is also possible that as with any injection,  there may be an allergic reaction or no effect from the medication. Reduced effectiveness after repeated injections is sometimes seen and rarely infection at the injection site may occur. All care will be taken to prevent these side effects. If therapy is given over a long time, atrophy and wasting in the muscle injected may occur. Occasionally the patient's become refractory to treatment because they develop antibodies to the toxin. In this event, therapy needs to be modified.  I have read the above information and consent to the administration of botulism toxin.    BOTOX PROCEDURE NOTE FOR MIGRAINE HEADACHE  Contraindications and precautions discussed with patient(above). Aseptic procedure was observed and patient tolerated procedure. Procedure performed by Shawnie Dapper, FNP-C.   The condition has existed for more than 6 months, and pt does not have a diagnosis of ALS, Myasthenia Gravis or Lambert-Eaton Syndrome.  Risks and benefits of injections discussed and pt agrees to proceed with the procedure.  Written consent obtained  These injections are medically necessary. Pt  receives good benefits from these injections. These injections do not cause sedations or hallucinations which the oral therapies may cause.   Description of procedure:  The patient was placed in a sitting position. The standard protocol was used for Botox as follows, with 5 units of Botox injected at each site:  -Procerus muscle, midline injection  -Corrugator muscle, bilateral injection  -Frontalis muscle, bilateral injection, with 2 sites each side, medial injection was performed in the upper one third of the frontalis muscle, in the region vertical from the medial inferior edge of the superior orbital  rim. The lateral injection was again in the upper one third of the forehead vertically above the lateral limbus of the cornea, 1.5 cm lateral to the medial injection site.  -Temporalis muscle injection, 4 sites, bilaterally.  The first injection was 3 cm above the tragus of the ear, second injection site was 1.5 cm to 3 cm up from the first injection site in line with the tragus of the ear. The third injection site was 1.5-3 cm forward between the first 2 injection sites. The fourth injection site was 1.5 cm posterior to the second injection site. 5th site laterally in the temporalis  muscleat the level of the outer canthus.  -Occipitalis muscle injection, 3 sites, bilaterally. The first injection was done one half way between the occipital protuberance and the tip of the mastoid process behind the ear. The second injection site was done lateral and superior to the first, 1 fingerbreadth from the first injection. The third injection site was 1 fingerbreadth superiorly and medially from the first injection site.  -Cervical paraspinal muscle injection, 2 sites, bilaterally. The first injection site was 1 cm from the midline of the cervical spine, 3 cm inferior to the lower border of the occipital protuberance. The second injection site was 1.5 cm superiorly and laterally to the first injection site.  -Trapezius muscle injection was performed at 3 sites, bilaterally. The first injection site was in the upper trapezius muscle halfway between the inflection point of the neck, and the acromion. The second injection site was one half way between the acromion and the first injection site. The third injection was done between the first injection site and the inflection point of the neck.   Will return for repeat injection in 3 months.   A total of 200 units of Botox was prepared, 155 units of Botox was injected as documented above, any Botox not injected was wasted. The patient tolerated the procedure well, there were no complications of the above procedure.

## 2020-10-29 ENCOUNTER — Ambulatory Visit: Payer: Managed Care, Other (non HMO) | Admitting: Obstetrics and Gynecology

## 2020-10-31 ENCOUNTER — Other Ambulatory Visit: Payer: Self-pay | Admitting: Obstetrics and Gynecology

## 2020-10-31 DIAGNOSIS — R350 Frequency of micturition: Secondary | ICD-10-CM

## 2020-10-31 NOTE — Telephone Encounter (Signed)
Please advise 

## 2020-11-01 ENCOUNTER — Other Ambulatory Visit: Payer: Self-pay

## 2020-11-01 ENCOUNTER — Encounter: Payer: Self-pay | Admitting: Urology

## 2020-11-01 ENCOUNTER — Ambulatory Visit (INDEPENDENT_AMBULATORY_CARE_PROVIDER_SITE_OTHER): Payer: Managed Care, Other (non HMO) | Admitting: Urology

## 2020-11-01 VITALS — BP 123/82 | HR 96 | Ht 64.0 in | Wt 172.0 lb

## 2020-11-01 DIAGNOSIS — R109 Unspecified abdominal pain: Secondary | ICD-10-CM

## 2020-11-01 DIAGNOSIS — R102 Pelvic and perineal pain: Secondary | ICD-10-CM | POA: Diagnosis not present

## 2020-11-01 DIAGNOSIS — R3129 Other microscopic hematuria: Secondary | ICD-10-CM

## 2020-11-01 NOTE — Progress Notes (Signed)
11/01/2020 1:05 PM   Janet Gallegos [birth year] [id number]  Referring provider: Zipporah Plants, CNM [address] [place],  [place] [address]  Chief Complaint  Patient presents with  . Flank Pain    HPI: Janet Gallegos is a 40 y.o. female referred for evaluation of urinary frequency and possible kidney stone   Onset intense urinary frequency 10/20/2020 and later that night developed left pelvic pain and subsequent left upper quadrant pain which was rated 8/10  Was seen at [facility] urgent care and started on antibiotics for possible UTI though urine culture was negative  Throughout the week has had recurrent episodes of urgency and pelvic pressure located in the left pelvis; symptoms seem triggered after voiding  Was seen at [facility] 2/24 and dipstick urine + blood  Renal ultrasound was ordered which showed no calculi or hydronephrosis  No prior history stone disease  Has continued to have intermittent symptoms  PMH: Past Medical History:  Diagnosis Date  . Acid reflux   . Asthma    prn inhaler  . 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 Common migraine with intractable migraine 09/23/2016  . Complication of anesthesia    states is hard to wake up post-op  . Dental bridge present upper  . Dental crown present    lower  . Dumping syndrome   . Endometriosis   . Family history of adverse reaction to anesthesia    states pt's mother woke up during a surgery  . GERD (gastroesophageal reflux disease)    no current med.  . Migraines     Surgical History: Past Surgical History:  Procedure Laterality Date  . CHONDROPLASTY Right 11/10/2014   Procedure: CHONDROPLASTY;  Surgeon: Marcene Corning, MD;  Location: [facility];  Service: Orthopedics;  Laterality: Right;  . DIAGNOSTIC LAPAROSCOPY     Prior to 2010 x 2  . KNEE ARTHROSCOPY W/ LATERAL RELEASE Right 07/20/2008  . KNEE ARTHROSCOPY WITH FULKERSON SLIDE Right 11/10/2014   Procedure: RIGHT KNEE  ARTHROSCOPY WITH Conan Bowens;  Surgeon: Marcene Corning, MD;  Location: [facility];  Service: Orthopedics;  Laterality: Right;  . KNEE ARTHROSCOPY WITH LATERAL RELEASE Right 11/10/2014   Procedure: KNEE ARTHROSCOPY WITH LATERAL RELEASE;  Surgeon: Marcene Corning, MD;  Location: [facility];  Service: Orthopedics;  Laterality: Right;  . SHOULDER ARTHROSCOPY  2020   Left   . TONSILLECTOMY N/A 07/31/2015   Procedure: TONSILLECTOMY;  Surgeon: Geanie Logan, MD;  Location: [facility];  Service: ENT;  Laterality: N/A;  . UPPER GI ENDOSCOPY      Home Medications:  Allergies as of 11/01/2020      Reactions   Sulfa Antibiotics Nausea And Vomiting   Percocet [oxycodone-acetaminophen] Nausea And Vomiting   Propranolol    Rash   Topamax [topiramate]    Nausea      Medication List       Accurate as of November 01, 2020  1:05 PM. If you have any questions, ask your nurse or doctor.        Albuterol Sulfate 108 (90 Base) MCG/ACT Aepb Commonly known as: PROAIR RESPICLICK Inhale into the lungs as needed.   Amzeeq 4 % Foam Generic drug: Minocycline HCl Micronized   Botox 100 units Solr injection Generic drug: botulinum toxin Type A INJECT 155 UNITS  INTRAMUSCULARLY EVERY 12  WEEKS (GIVEN AT MD OFFICE,  DISCARD UNUSED)   budesonide-formoterol 160-4.5 MCG/ACT inhaler Commonly known as: SYMBICORT Inhale 2 puffs into the lungs  2 (two) times daily.   cetirizine 10 MG tablet Commonly known as: ZYRTEC Take 10 mg by mouth daily.   dexlansoprazole 60 MG capsule Commonly known as: DEXILANT Take 1 capsule by mouth  daily as needed   diclofenac 75 MG EC tablet Commonly known as: VOLTAREN Take 1 tablet (75 mg total) by mouth 2 (two) times daily.   EPINEPHrine 0.3 mg/0.3 mL Soaj injection Commonly known as: EPI-PEN See admin instructions.   levonorgestrel 20 MCG/24HR IUD Commonly known as: MIRENA 1 each by Intrauterine route once.   lisdexamfetamine 40  MG capsule Commonly known as: VYVANSE Take 40 mg by mouth every morning.   rizatriptan 10 MG tablet Commonly known as: MAXALT TAKE 1 TABLET BY MOUTH AT ONSET OF MIGRAINE MAY REPEAT IN 2 HRS IF NEEDED   spironolactone 25 MG tablet Commonly known as: ALDACTONE Take 25 mg by mouth 2 (two) times daily.       Allergies:  Allergies  Allergen Reactions  . Sulfa Antibiotics Nausea And Vomiting  . Percocet [Oxycodone-Acetaminophen] Nausea And Vomiting  . Propranolol     Rash  . Topamax [Topiramate]     Nausea    Family History: Family History  Problem Relation Age of Onset  . Anesthesia problems Mother        woke up during surgery  . Parkinson's disease Paternal Grandfather   . Migraines Neg Hx     Social History:  reports that she quit smoking about 5 years ago. Her smoking use included cigarettes. She has a 2.00 pack-year smoking history. She has never used smokeless tobacco. She reports current alcohol use of about 1.0 - 2.0 standard drink of alcohol per week. She reports that she does not use drugs.   Physical Exam: BP 123/82   Pulse 96   Ht 5\' 4"  (1.626 m)   Wt 172 lb (78 kg)   BMI 29.52 kg/m   Constitutional:  Alert and oriented, No acute distress. HEENT: [place] AT, moist mucus membranes.  Trachea midline, no masses. Cardiovascular: No clubbing, cyanosis, or edema. Respiratory: Normal respiratory effort, no increased work of breathing. GI: Abdomen is soft, nontender, nondistended, no abdominal masses GU: No CVA tenderness Skin: No rashes, bruises or suspicious lesions. Neurologic: Grossly intact, no focal deficits, moving all 4 extremities. Psychiatric: Normal mood and affect.  Laboratory Data:  Urinalysis Dipstick/microscopy negative  Pertinent Imaging: Ultrasound images personally reviewed and interpreted  RENAL  Narrative CLINICAL DATA:  Left-sided renal colic for several days.  EXAM: RENAL / URINARY TRACT ULTRASOUND COMPLETE  COMPARISON:   Abdominal ultrasound 09/15/2013  FINDINGS: Right Kidney:  Renal measurements: 10.3 x 4.8 x 5.4 cm = volume: 138 mL. Echogenicity within normal limits. No mass or hydronephrosis visualized. No shadowing renal calculus.  Left Kidney:  Renal measurements: 11.1 x 5.3 x 5.0 cm = volume: 154 mL. Echogenicity within normal limits. There is a 2.6 cm cyst in the midpole. No solid mass. No hydronephrosis. No shadowing renal calculus.  Bladder:  Appears normal for degree of bladder distention.  Other:  None.  IMPRESSION: 1. No sonographic evidence of renal calculi or hydronephrosis bilaterally.  2.  Benign left renal cyst.   Electronically Signed By: 09/17/2013 M.D. On: 10/25/2020 16:08   Assessment & Plan:    40 y.o. female with recurrent episodes of intense urgency, pelvic pain and left upper quadrant abdominal pain with microhematuria; renal ultrasound unremarkable  Symptoms suspicious for left distal ureteral calculus and recommend scheduling stone protocol CT  abdomen pelvis  Will notify with results   Riki Altes, MD  [facility] [address], [address] [place], [place] [address] [phone]

## 2020-11-02 ENCOUNTER — Other Ambulatory Visit: Payer: Self-pay

## 2020-11-02 ENCOUNTER — Telehealth: Payer: Self-pay | Admitting: Urology

## 2020-11-02 ENCOUNTER — Ambulatory Visit
Admission: RE | Admit: 2020-11-02 | Discharge: 2020-11-02 | Disposition: A | Discharge status: Home / Self Care | Payer: Managed Care, Other (non HMO) | Source: Ambulatory Visit | Attending: Urology | Admitting: Urology

## 2020-11-02 DIAGNOSIS — R109 Unspecified abdominal pain: Secondary | ICD-10-CM | POA: Insufficient documentation

## 2020-11-02 DIAGNOSIS — R102 Pelvic and perineal pain: Secondary | ICD-10-CM | POA: Insufficient documentation

## 2020-11-02 DIAGNOSIS — R3129 Other microscopic hematuria: Secondary | ICD-10-CM | POA: Diagnosis present

## 2020-11-02 LAB — URINALYSIS, COMPLETE
Bilirubin, UA: NEGATIVE
Glucose, UA: NEGATIVE
Leukocytes,UA: NEGATIVE
Nitrite, UA: NEGATIVE
RBC, UA: NEGATIVE
Specific Gravity, UA: >1.03 — ABNORMAL HIGH (ref 1.005–1.030)
Urobilinogen, Ur: 0.2 mg/dL (ref 0.2–1.0)
pH, UA: 5 (ref 5.0–7.5)

## 2020-11-02 LAB — MICROSCOPIC EXAMINATION

## 2020-11-02 MED ORDER — TAMSULOSIN HCL 0.4 MG PO CAPS: 0.4000 mg | ORAL_CAPSULE | Freq: Every day | ORAL | 0 refills | Status: DC

## 2020-11-02 NOTE — Telephone Encounter (Signed)
CT scan reviewed and there is a small ~ 2 mm calculus in the left ureter very close to passing.  This would be the cause of her symptoms.  There is a 90+ percent chance the stone will pass.  Would send in Rx tamsulosin 0.4 mg daily to pharmacy to aid in stone passage  She can come by the office to get a strainer  If she has not passed by next week we could get her scheduled for ureteroscopy.

## 2020-11-02 NOTE — Telephone Encounter (Signed)
Notified patient as instructed, patient pleased. Discussed follow-up appointments, patient agrees  

## 2020-11-04 ENCOUNTER — Encounter: Payer: Self-pay | Admitting: Urology

## 2020-11-05 ENCOUNTER — Encounter: Payer: Self-pay | Admitting: Urology

## 2020-11-07 ENCOUNTER — Other Ambulatory Visit: Payer: Self-pay

## 2020-11-07 ENCOUNTER — Other Ambulatory Visit: Payer: Managed Care, Other (non HMO)

## 2020-11-07 DIAGNOSIS — R109 Unspecified abdominal pain: Secondary | ICD-10-CM

## 2020-11-07 NOTE — Telephone Encounter (Signed)
Patient left a message on the triage line stating that she thinks she passed the stone, do I need to bring it in? Do I continue to strain? If so, how long? Do I need to keep taking flowmax?  She also sent a mychart message with the same message as well

## 2020-11-08 NOTE — Telephone Encounter (Signed)
Would have her bring the stone by the office and schedule a follow-up visit and ~ 1 month.  If symptoms have resolved she can stop straining and discontinue Flomax

## 2020-11-13 LAB — CALCULI, WITH PHOTOGRAPH (CLINICAL LAB)
Calcium Oxalate Dihydrate: 90 %
Calcium Oxalate Monohydrate: 10 %
Weight Calculi: 3 mg

## 2020-11-14 ENCOUNTER — Encounter: Payer: Self-pay | Admitting: *Deleted

## 2020-12-13 ENCOUNTER — Encounter: Payer: Self-pay | Admitting: Urology

## 2020-12-13 ENCOUNTER — Ambulatory Visit (INDEPENDENT_AMBULATORY_CARE_PROVIDER_SITE_OTHER): Payer: Managed Care, Other (non HMO) | Admitting: Urology

## 2020-12-13 ENCOUNTER — Other Ambulatory Visit: Payer: Self-pay

## 2020-12-13 VITALS — BP 95/58 | HR 92 | Ht 64.0 in | Wt 165.0 lb

## 2020-12-13 DIAGNOSIS — Z87442 Personal history of urinary calculi: Secondary | ICD-10-CM

## 2020-12-13 NOTE — Progress Notes (Signed)
12/13/2020 11:12 AM   [place] 1981/07/28 [id number]  Referring provider: Philmore Pali, NP [address] [place],  [place] [address]  Chief Complaint  Patient presents with  . Other    HPI: 40 y.o. female presents for follow-up visit.   Initially seen 11/01/2020 for voiding symptoms and pelvic pressure  CT ordered which showed a punctate left UVJ calculus and MET recommended  She did pass stone and brought into the office with results returning CaOxMono/CaOxDi 10/90  No prior history stone disease; no renal calculi on CT  She has increased her water intake and moderated dietary oxalate intake  No complaints today   PMH: Past Medical History:  Diagnosis Date  . Acid reflux   . Asthma    prn inhaler  . 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 Common migraine with intractable migraine 09/23/2016  . Complication of anesthesia    states is hard to wake up post-op  . Dental bridge present upper  . Dental crown present    lower  . Dumping syndrome   . Endometriosis   . Family history of adverse reaction to anesthesia    states pt's mother woke up during a surgery  . GERD (gastroesophageal reflux disease)    no current med.  . Migraines     Surgical History: Past Surgical History:  Procedure Laterality Date  . CHONDROPLASTY Right 11/10/2014   Procedure: CHONDROPLASTY;  Surgeon: Melrose Nakayama, MD;  Location: [place];  Service: Orthopedics;  Laterality: Right;  . DIAGNOSTIC LAPAROSCOPY     Prior to 2010 x 2  . KNEE ARTHROSCOPY W/ LATERAL RELEASE Right 07/20/2008  . KNEE ARTHROSCOPY WITH FULKERSON SLIDE Right 11/10/2014   Procedure: RIGHT KNEE ARTHROSCOPY WITH Elmarie Mainland;  Surgeon: Melrose Nakayama, MD;  Location: [place];  Service: Orthopedics;  Laterality: Right;  . KNEE ARTHROSCOPY WITH LATERAL RELEASE Right 11/10/2014   Procedure: KNEE ARTHROSCOPY WITH LATERAL RELEASE;  Surgeon: Melrose Nakayama, MD;  Location: [place];  Service: Orthopedics;  Laterality: Right;  . SHOULDER ARTHROSCOPY  2020   Left   . TONSILLECTOMY N/A 07/31/2015   Procedure: TONSILLECTOMY;  Surgeon: Clyde Canterbury, MD;  Location: [place];  Service: ENT;  Laterality: N/A;  . UPPER GI ENDOSCOPY      Home Medications:  Allergies as of 12/13/2020      Reactions   Sulfa Antibiotics Nausea And Vomiting   Percocet [oxycodone-acetaminophen] Nausea And Vomiting   Propranolol    Rash   Topamax [topiramate]    Nausea      Medication List       Accurate as of December 13, 2020 11:59 PM. If you have any questions, ask your nurse or doctor.        STOP taking these medications   tamsulosin 0.4 MG Caps capsule Commonly known as: FLOMAX Stopped by: Abbie Sons, MD     TAKE these medications   Albuterol Sulfate 108 (90 Base) MCG/ACT Aepb Commonly known as: PROAIR RESPICLICK Inhale into the lungs as needed.   Amzeeq 4 % Foam Generic drug: Minocycline HCl Micronized   Botox 100 units Solr injection Generic drug: botulinum toxin Type A INJECT 155 UNITS  INTRAMUSCULARLY EVERY 12  WEEKS (GIVEN AT MD OFFICE,  DISCARD UNUSED)   budesonide-formoterol 160-4.5 MCG/ACT inhaler Commonly known as: SYMBICORT Inhale 2 puffs into the lungs 2 (two) times daily.   cetirizine 10 MG tablet Commonly known as: ZYRTEC Take 10  mg by mouth daily.   dexlansoprazole 60 MG capsule Commonly known as: DEXILANT Take 1 capsule by mouth  daily as needed   diclofenac 75 MG EC tablet Commonly known as: VOLTAREN Take 1 tablet (75 mg total) by mouth 2 (two) times daily.   EPINEPHrine 0.3 mg/0.3 mL Soaj injection Commonly known as: EPI-PEN See admin instructions.   levonorgestrel 20 MCG/24HR IUD Commonly known as: MIRENA 1 each by Intrauterine route once.   lisdexamfetamine 40 MG capsule Commonly known as: VYVANSE Take 40 mg by mouth every morning.   rizatriptan 10 MG tablet Commonly known as: MAXALT TAKE 1 TABLET  BY MOUTH AT ONSET OF MIGRAINE MAY REPEAT IN 2 HRS IF NEEDED   spironolactone 25 MG tablet Commonly known as: ALDACTONE Take 25 mg by mouth 2 (two) times daily.       Allergies:  Allergies  Allergen Reactions  . Sulfa Antibiotics Nausea And Vomiting  . Percocet [Oxycodone-Acetaminophen] Nausea And Vomiting  . Propranolol     Rash  . Topamax [Topiramate]     Nausea    Family History: Family History  Problem Relation Age of Onset  . Anesthesia problems Mother        woke up during surgery  . Parkinson's disease Paternal Grandfather   . Migraines Neg Hx     Social History:  reports that she quit smoking about 5 years ago. Her smoking use included cigarettes. She has a 2.00 pack-year smoking history. She has never used smokeless tobacco. She reports current alcohol use of about 1.0 - 2.0 standard drink of alcohol per week. She reports that she does not use drugs.   Physical Exam: BP (!) 95/58   Pulse 92   Ht _0  (1.626 m)   Wt 165 lb (74.8 kg)   BMI 28.32 kg/m   Constitutional:  Alert and oriented, No acute distress.   Assessment & Plan:    40 y.o. female with a recently passed ureteral calculus.  CT did not show any renal calculi  We discussed ~ 3% chance of forming another stone within the next 5 years  Stone prevention recommendations were discussed including increasing water intake to keep urine output >2 L/day and dietary oxalate moderation.  She was provided literature on stone prevention  Follow-up 1 year with [place], MD  [place] [address] [place], [place] [address] [phone]

## 2020-12-15 ENCOUNTER — Encounter: Payer: Self-pay | Admitting: Urology

## 2020-12-15 DIAGNOSIS — Z87442 Personal history of urinary calculi: Secondary | ICD-10-CM | POA: Insufficient documentation

## 2021-01-07 ENCOUNTER — Telehealth: Payer: Self-pay | Admitting: Family Medicine

## 2021-01-07 NOTE — Telephone Encounter (Signed)
Patient has a Botox appointment 6/1. Received fax from [facility] stating patient's Botox TBD 5/11.

## 2021-01-09 NOTE — Telephone Encounter (Signed)
Received (1) 200 unit vial of Botox today from [facility]. 

## 2021-01-30 ENCOUNTER — Ambulatory Visit: Payer: Managed Care, Other (non HMO) | Admitting: Family Medicine

## 2021-01-30 DIAGNOSIS — G43019 Migraine without aura, intractable, without status migrainosus: Secondary | ICD-10-CM

## 2021-01-30 NOTE — Progress Notes (Signed)
01/30/2021 ALL: She continues to do well with Botox therapy. Nurtec works well for abortive therapy. She will occasionally use rizatriptan if Nurtec not effective. She has had 1 migraine this month after being out of Vyvanse for a few days.   10/25/2020 ALL: She is doing well. Botox continues to be effective in migraine management. Nurtec helps with abortive therapy.   07/24/2020 ALL: She is doing well. Migraines are well managed. She has received Nurtec and it is working well. She was recently started on Vyvanse for ADHD. No worsening in migraines thus far.   Consent Form Botulism Toxin Injection For Chronic Migraine    Reviewed orally with patient, additionally signature is on file:  Botulism toxin has been approved by the Federal drug administration for treatment of chronic migraine. Botulism toxin does not cure chronic migraine and it may not be effective in some patients.  The administration of botulism toxin is accomplished by injecting a small amount of toxin into the muscles of the neck and head. Dosage must be titrated for each individual. Any benefits resulting from botulism toxin tend to wear off after 3 months with a repeat injection required if benefit is to be maintained. Injections are usually done every 3-4 months with maximum effect peak achieved by about 2 or 3 weeks. Botulism toxin is expensive and you should be sure of what costs you will incur resulting from the injection.  The side effects of botulism toxin use for chronic migraine may include:   -Transient, and usually mild, facial weakness with facial injections  -Transient, and usually mild, head or neck weakness with head/neck injections  -Reduction or loss of forehead facial animation due to forehead muscle weakness  -Eyelid drooping  -Dry eye  -Pain at the site of injection or bruising at the site of injection  -Double vision  -Potential unknown long term risks   Contraindications: You should not have Botox  if you are pregnant, nursing, allergic to albumin, have an infection, skin condition, or muscle weakness at the site of the injection, or have myasthenia gravis, Lambert-Eaton syndrome, or ALS.  It is also possible that as with any injection, there may be an allergic reaction or no effect from the medication. Reduced effectiveness after repeated injections is sometimes seen and rarely infection at the injection site may occur. All care will be taken to prevent these side effects. If therapy is given over a long time, atrophy and wasting in the muscle injected may occur. Occasionally the patient's become refractory to treatment because they develop antibodies to the toxin. In this event, therapy needs to be modified.  I have read the above information and consent to the administration of botulism toxin.    BOTOX PROCEDURE NOTE FOR MIGRAINE HEADACHE  Contraindications and precautions discussed with patient(above). Aseptic procedure was observed and patient tolerated procedure. Procedure performed by Shawnie Dapper, FNP-C.   The condition has existed for more than 6 months, and pt does not have a diagnosis of ALS, Myasthenia Gravis or Lambert-Eaton Syndrome.  Risks and benefits of injections discussed and pt agrees to proceed with the procedure.  Written consent obtained  These injections are medically necessary. Pt  receives good benefits from these injections. These injections do not cause sedations or hallucinations which the oral therapies may cause.   Description of procedure:  The patient was placed in a sitting position. The standard protocol was used for Botox as follows, with 5 units of Botox injected at each site:  -Procerus muscle,  midline injection  -Corrugator muscle, bilateral injection  -Frontalis muscle, bilateral injection, with 2 sites each side, medial injection was performed in the upper one third of the frontalis muscle, in the region vertical from the medial inferior edge of  the superior orbital rim. The lateral injection was again in the upper one third of the forehead vertically above the lateral limbus of the cornea, 1.5 cm lateral to the medial injection site.  -Temporalis muscle injection, 4 sites, bilaterally. The first injection was 3 cm above the tragus of the ear, second injection site was 1.5 cm to 3 cm up from the first injection site in line with the tragus of the ear. The third injection site was 1.5-3 cm forward between the first 2 injection sites. The fourth injection site was 1.5 cm posterior to the second injection site. 5th site laterally in the temporalis  muscleat the level of the outer canthus.  -Occipitalis muscle injection, 3 sites, bilaterally. The first injection was done one half way between the occipital protuberance and the tip of the mastoid process behind the ear. The second injection site was done lateral and superior to the first, 1 fingerbreadth from the first injection. The third injection site was 1 fingerbreadth superiorly and medially from the first injection site.  -Cervical paraspinal muscle injection, 2 sites, bilaterally. The first injection site was 1 cm from the midline of the cervical spine, 3 cm inferior to the lower border of the occipital protuberance. The second injection site was 1.5 cm superiorly and laterally to the first injection site.  -Trapezius muscle injection was performed at 3 sites, bilaterally. The first injection site was in the upper trapezius muscle halfway between the inflection point of the neck, and the acromion. The second injection site was one half way between the acromion and the first injection site. The third injection was done between the first injection site and the inflection point of the neck.   Will return for repeat injection in 3 months.   A total of 200 units of Botox was prepared, 155 units of Botox was injected as documented above, any Botox not injected was wasted. The patient tolerated the  procedure well, there were no complications of the above procedure.

## 2021-01-30 NOTE — Progress Notes (Signed)
Botox- 200 units x 1 vial Lot: [id number] Expiration: 09/2023 NDC: 1103-1594-58  Bacteriostatic 0.9% Sodium Chloride- 23mL total Lot: [id number] Expiration: 01/30/2022 NDC: 4628-6381-77  Dx: G43.019 S/P

## 2021-02-28 ENCOUNTER — Telehealth: Payer: Self-pay | Admitting: Neurology

## 2021-02-28 ENCOUNTER — Other Ambulatory Visit: Payer: Self-pay

## 2021-02-28 ENCOUNTER — Emergency Department
Admission: EM | Admit: 2021-02-28 | Discharge: 2021-02-28 | Disposition: A | Discharge status: Home / Self Care | Payer: Managed Care, Other (non HMO) | Attending: Emergency Medicine | Admitting: Emergency Medicine

## 2021-02-28 ENCOUNTER — Encounter: Payer: Self-pay | Admitting: Family Medicine

## 2021-02-28 ENCOUNTER — Encounter: Payer: Self-pay | Admitting: Emergency Medicine

## 2021-02-28 DIAGNOSIS — Z7951 Long term (current) use of inhaled steroids: Secondary | ICD-10-CM | POA: Diagnosis not present

## 2021-02-28 DIAGNOSIS — J45909 Unspecified asthma, uncomplicated: Secondary | ICD-10-CM | POA: Insufficient documentation

## 2021-02-28 DIAGNOSIS — R519 Headache, unspecified: Secondary | ICD-10-CM | POA: Insufficient documentation

## 2021-02-28 DIAGNOSIS — Z87891 Personal history of nicotine dependence: Secondary | ICD-10-CM | POA: Diagnosis not present

## 2021-02-28 MED ORDER — DIPHENHYDRAMINE HCL 50 MG/ML IJ SOLN
50.0000 mg | Freq: Once | INTRAMUSCULAR | Status: AC
  Administered 2021-02-28: 50 mg via INTRAVENOUS
  Filled 2021-02-28: qty 1

## 2021-02-28 MED ORDER — SODIUM CHLORIDE 0.9 % IV BOLUS
1000.0000 mL | Freq: Once | INTRAVENOUS | Status: AC
  Administered 2021-02-28: 1000 mL via INTRAVENOUS

## 2021-02-28 MED ORDER — KETOROLAC TROMETHAMINE 30 MG/ML IJ SOLN
30.0000 mg | Freq: Once | INTRAMUSCULAR | Status: AC
  Administered 2021-02-28: 30 mg via INTRAVENOUS
  Filled 2021-02-28: qty 1

## 2021-02-28 MED ORDER — METOCLOPRAMIDE HCL 5 MG/ML IJ SOLN
10.0000 mg | Freq: Once | INTRAMUSCULAR | Status: AC
  Administered 2021-02-28: 10 mg via INTRAVENOUS
  Filled 2021-02-28: qty 2

## 2021-02-28 MED ORDER — DEXAMETHASONE SODIUM PHOSPHATE 10 MG/ML IJ SOLN
10.0000 mg | Freq: Once | INTRAMUSCULAR | Status: AC
  Administered 2021-02-28: 10 mg via INTRAVENOUS
  Filled 2021-02-28: qty 1

## 2021-02-28 NOTE — Telephone Encounter (Signed)
I called pt and she is having migraine (woke up with this level 10, took nurtec is level 9).  Having nausea, vision blurriness, R sided head pain.   Asking for infusion. She is not pregnant and is has driver if infusion can be done.  Will check with [facility], about availability.  She has had before.

## 2021-02-28 NOTE — Telephone Encounter (Signed)
Spoke pt and [facility] had availablity up until 1430, pt statd would take her 40 min to get here.  So coming here for infusion not an option.  I relayed that she could get prednisone taper or go to urgent care.  She stated will go to urgent care.  I relayed that we have given depacon as main treatment for migraine when pts have infusion.  She appreciated call back.

## 2021-02-28 NOTE — ED Triage Notes (Signed)
Pt reports that she developed this am at 0700 she tried to sleep through it, she called her neurologist, they called her back to come in and get an infusion but the office had closed before she got the call. They usually give her Depakon and another medication she is unsure of that she needs a driver for.

## 2021-02-28 NOTE — ED Provider Notes (Signed)
[facility]-EMERGENCY DEPARTMENT  ____________________________________________  Time seen: Approximately 5:02 PM  I have reviewed the triage vital signs and the nursing notes.   HISTORY  Chief Complaint Headache   Historian Patient     HPI Janet Gallegos is a 40 y.o. female with a history of migraines, presents to the emergency department with a frontal headache that started this morning at 7 AM.  Patient spoke with her neurologist and had availability for an infusion of Depacon at 230 but patient was not able to make it to her neurologist in time.  They recommended that she seek care at an emergency department or local urgent care for possible steroids.  She states that she has had a similar headache in the past but it has been several months.  She states that she is prescribed abortive medications at home which have not been helpful to her today.  No chest pain, chest tightness or abdominal pain.  No fever or chills at home.   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   Migraines      Immunizations up to date:  Yes.     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   Migraines     Patient Active Problem List   Diagnosis Date Noted   Personal history of urinary calculi 12/15/2020   Patellar instability 05/30/2019   Common migraine with intractable migraine 09/23/2016   Low back pain 08/12/2016   Shoulder pain 08/12/2016   Hemorrhoids, external without complications 05/21/2011    Past Surgical History:  Procedure Laterality Date   CHONDROPLASTY Right 11/10/2014   Procedure: CHONDROPLASTY;  Surgeon: Marcene Corning, MD;  Location: [facility];  Service: Orthopedics;  Laterality: Right;   DIAGNOSTIC LAPAROSCOPY     Prior to 2010 x 2   KNEE ARTHROSCOPY W/ LATERAL RELEASE Right 07/20/2008   KNEE ARTHROSCOPY WITH FULKERSON SLIDE Right 11/10/2014   Procedure: RIGHT KNEE ARTHROSCOPY WITH Conan Bowens;  Surgeon: Marcene Corning, MD;  Location: [facility];  Service: Orthopedics;  Laterality: Right;   KNEE ARTHROSCOPY WITH LATERAL RELEASE Right 11/10/2014   Procedure: KNEE ARTHROSCOPY WITH LATERAL RELEASE;  Surgeon: Marcene Corning, MD;  Location: [facility];  Service: Orthopedics;  Laterality: Right;   SHOULDER ARTHROSCOPY  2020   Left    TONSILLECTOMY N/A 07/31/2015   Procedure: TONSILLECTOMY;  Surgeon: Geanie Logan, MD;  Location: [facility];  Service: ENT;  Laterality: N/A;   UPPER GI ENDOSCOPY      Prior to Admission medications   Medication Sig Start Date End Date Taking? Authorizing Provider  Albuterol Sulfate 108 (90 BASE) MCG/ACT AEPB Inhale into the lungs as needed.     [provider]  AMZEEQ 4 % FOAM  06/04/19   [provider]  BOTOX 100 units SOLR injection INJECT 155 UNITS  INTRAMUSCULARLY EVERY 12  WEEKS (GIVEN AT MD OFFICE,  DISCARD UNUSED) 09/12/19   Lomax, Amy, NP  budesonide-formoterol (SYMBICORT) 160-4.5 MCG/ACT inhaler Inhale 2 puffs into the lungs 2 (two) times  daily.    [provider]  cetirizine (ZYRTEC) 10 MG tablet Take 10 mg by mouth daily.    [provider]  dexlansoprazole (DEXILANT) 60 MG capsule Take 1 capsule by mouth  daily as needed 04/15/16   [provider]  diclofenac (VOLTAREN) 75 MG EC tablet Take 1 tablet (75 mg total) by mouth 2 (two) times daily. 03/07/20   Hyatt, Max T, DPM  EPINEPHrine 0.3 mg/0.3 mL IJ SOAJ injection See admin instructions. 04/12/20   [provider]  levonorgestrel (MIRENA) 20 MCG/24HR IUD 1 each by Intrauterine route once.    [provider]  NURTEC 75 MG TBDP Take 1 tablet by mouth daily as needed for migraine. 12/29/20   [provider]  rizatriptan (MAXALT) 10 MG tablet TAKE 1 TABLET BY MOUTH AT ONSET OF MIGRAINE MAY REPEAT IN 2 HRS IF NEEDED 11/03/18   York Spaniel, MD  spironolactone (ALDACTONE) 25 MG tablet Take 25 mg by mouth 2 (two) times daily.    [provider]  VYVANSE 60 MG capsule Take 60 mg by mouth every morning. 01/12/21   [provider]    Allergies Sulfa antibiotics, Percocet [oxycodone-acetaminophen], Propranolol, and Topamax [topiramate]  Family History  Problem Relation Age of Onset   Anesthesia problems Mother        woke up during surgery   Parkinson's disease Paternal Grandfather    Migraines Neg Hx     Social History Social History   Tobacco Use   Smoking status: Former    Packs/day: 0.50    Years: 4.00    Pack years: 2.00    Types: Cigarettes    Quit date: 01/11/2015    Years since quitting: 6.1   Smokeless tobacco: Never   Tobacco comments:       Vaping Use   Vaping Use: Never used  Substance Use Topics   Alcohol use: Yes    Alcohol/week: 1.0 - 2.0 standard drink    Types: 1 - 2 Standard drinks or equivalent per week    Comment: occasionally   Drug use: No     Review of Systems  Constitutional: No fever/chills Eyes:  No discharge ENT: No upper respiratory complaints. Respiratory: no  cough. No SOB/ use of accessory muscles to breath Gastrointestinal:   No nausea, no vomiting.  No diarrhea.  No constipation. Musculoskeletal: Negative for musculoskeletal pain. Neuro: Patient has frontal headache.  Skin: Negative for rash, abrasions, lacerations, ecchymosis.    ____________________________________________   PHYSICAL EXAM:  VITAL SIGNS: ED Triage Vitals  Enc Vitals Group     BP 02/28/21 1330 137/68     Pulse Rate 02/28/21 1330 90     Resp 02/28/21 1526 17     Temp 02/28/21 1526 99.7 F (37.6 C)     Temp Source  02/28/21 1526 Oral     SpO2 02/28/21 1330 96 %     Weight 02/28/21 1530 160 lb (72.6 kg)     Height 02/28/21 1530 5\' 4"  (1.626 m)     Head Circumference --      Peak Flow --      Pain Score 02/28/21 1530 8     Pain Loc --      Pain Edu? --      Excl. in GC? --      Constitutional: Alert and oriented. Well appearing and in no acute distress. Eyes: Conjunctivae are normal. PERRL. EOMI. Head: Atraumatic. ENT:      Nose: No congestion/rhinnorhea.      Mouth/Throat: Mucous membranes are moist.  Neck: No stridor.  No cervical spine tenderness to palpation. Cardiovascular: Normal rate, regular rhythm. Normal S1 and S2.  Good peripheral circulation. Respiratory: Normal respiratory effort without tachypnea or retractions. Lungs CTAB. Good air entry to the bases with no decreased or absent breath sounds Gastrointestinal: Bowel sounds x 4 quadrants. Soft and nontender to palpation. No guarding or rigidity. No distention. Musculoskeletal: Full range of motion to all extremities. No obvious deformities noted Neurologic:  Normal for age. No gross focal neurologic deficits are appreciated.  Skin:  Skin is warm, dry and intact. No rash noted. Psychiatric: Mood and affect are normal for age. Speech and behavior are normal.   ____________________________________________   LABS (all labs ordered are listed, but only abnormal results are displayed)  Labs  Reviewed - No data to display ____________________________________________  EKG   ____________________________________________  RADIOLOGY   No results found.  ____________________________________________    PROCEDURES  Procedure(s) performed:     Procedures     Medications  sodium chloride 0.9 % bolus 1,000 mL (1,000 mLs Intravenous New Bag/Given 02/28/21 1727)  ketorolac (TORADOL) 30 MG/ML injection 30 mg (30 mg Intravenous Given 02/28/21 1728)  diphenhydrAMINE (BENADRYL) injection 50 mg (50 mg Intravenous Given 02/28/21 1728)  metoCLOPramide (REGLAN) injection 10 mg (10 mg Intravenous Given 02/28/21 1728)  dexamethasone (DECADRON) injection 10 mg (10 mg Intravenous Given 02/28/21 1728)     ____________________________________________   INITIAL IMPRESSION / ASSESSMENT AND PLAN / ED COURSE  Pertinent labs & imaging results that were available during my care of the patient were reviewed by me and considered in my medical decision making (see chart for details).      Assessment and Plan: Headache:  40 year old female presents to the emergency department with a migraine and endorses history of similar headaches.  Patient was tachycardic at triage but vital signs otherwise reassuring.  She had no neurodeficits on exam.  She was given Toradol, Reglan, Decadron and supplemental fluids in the emergency department she reported that her headache resolved.  She was advised to follow-up with neurology as needed.  Return precautions were given to return with new or worsening symptoms.    ____________________________________________  FINAL CLINICAL IMPRESSION(S) / ED DIAGNOSES  Final diagnoses:  Acute nonintractable headache, unspecified headache type      NEW MEDICATIONS STARTED DURING THIS VISIT:  ED Discharge Orders     None           This chart was dictated using voice recognition software/Dragon. Despite best efforts to proofread, errors can occur which  can change the meaning. Any change was purely unintentional.     24, PA-C 02/28/21 1830    03/02/21, MD 03/03/21 1534

## 2021-04-08 ENCOUNTER — Other Ambulatory Visit: Payer: Self-pay | Admitting: Family Medicine

## 2021-04-08 ENCOUNTER — Encounter: Payer: Self-pay | Admitting: Family Medicine

## 2021-04-08 MED ORDER — METHYLPREDNISOLONE 4 MG PO TBPK: ORAL_TABLET | ORAL | 0 refills | Status: DC

## 2021-04-08 MED ORDER — NURTEC 75 MG PO TBDP: 1.0000 | ORAL_TABLET | Freq: Every day | ORAL | 3 refills | Status: DC | PRN

## 2021-04-08 NOTE — Telephone Encounter (Signed)
Spoke w/ Liane in infusion suite. They still do not have depacon available, on back order

## 2021-05-01 NOTE — Progress Notes (Signed)
05/02/2021 ALL: She continues Botox and Nurtec. She is doing well. She did have an intractable migraine about 2 weeks ago. Decadron on back order and medrol dose pack was given. It did help.   01/30/2021 ALL: She continues to do well with Botox therapy. Nurtec works well for abortive therapy. She will occasionally use rizatriptan if Nurtec not effective. She has had 1 migraine this month after being out of Vyvanse for a few days.   10/25/2020 ALL: She is doing well. Botox continues to be effective in migraine management. Nurtec helps with abortive therapy.   07/24/2020 ALL: She is doing well. Migraines are well managed. She has received Nurtec and it is working well. She was recently started on Vyvanse for ADHD. No worsening in migraines thus far.   Consent Form Botulism Toxin Injection For Chronic Migraine    Reviewed orally with patient, additionally signature is on file:  Botulism toxin has been approved by the Federal drug administration for treatment of chronic migraine. Botulism toxin does not cure chronic migraine and it may not be effective in some patients.  The administration of botulism toxin is accomplished by injecting a small amount of toxin into the muscles of the neck and head. Dosage must be titrated for each individual. Any benefits resulting from botulism toxin tend to wear off after 3 months with a repeat injection required if benefit is to be maintained. Injections are usually done every 3-4 months with maximum effect peak achieved by about 2 or 3 weeks. Botulism toxin is expensive and you should be sure of what costs you will incur resulting from the injection.  The side effects of botulism toxin use for chronic migraine may include:   -Transient, and usually mild, facial weakness with facial injections  -Transient, and usually mild, head or neck weakness with head/neck injections  -Reduction or loss of forehead facial animation due to forehead muscle weakness  -Eyelid  drooping  -Dry eye  -Pain at the site of injection or bruising at the site of injection  -Double vision  -Potential unknown long term risks   Contraindications: You should not have Botox if you are pregnant, nursing, allergic to albumin, have an infection, skin condition, or muscle weakness at the site of the injection, or have myasthenia gravis, Lambert-Eaton syndrome, or ALS.  It is also possible that as with any injection, there may be an allergic reaction or no effect from the medication. Reduced effectiveness after repeated injections is sometimes seen and rarely infection at the injection site may occur. All care will be taken to prevent these side effects. If therapy is given over a long time, atrophy and wasting in the muscle injected may occur. Occasionally the patient's become refractory to treatment because they develop antibodies to the toxin. In this event, therapy needs to be modified.  I have read the above information and consent to the administration of botulism toxin.   BOTOX PROCEDURE NOTE FOR MIGRAINE HEADACHE  Contraindications and precautions discussed with patient(above). Aseptic procedure was observed and patient tolerated procedure. Procedure performed by Shawnie Dapper, FNP-C.   The condition has existed for more than 6 months, and pt does not have a diagnosis of ALS, Myasthenia Gravis or Lambert-Eaton Syndrome.  Risks and benefits of injections discussed and pt agrees to proceed with the procedure.  Written consent obtained  These injections are medically necessary. Pt  receives good benefits from these injections. These injections do not cause sedations or hallucinations which the oral therapies may cause.  Description of procedure:  The patient was placed in a sitting position. The standard protocol was used for Botox as follows, with 5 units of Botox injected at each site:  -Procerus muscle, midline injection  -Corrugator muscle, bilateral  injection  -Frontalis muscle, bilateral injection, with 2 sites each side, medial injection was performed in the upper one third of the frontalis muscle, in the region vertical from the medial inferior edge of the superior orbital rim. The lateral injection was again in the upper one third of the forehead vertically above the lateral limbus of the cornea, 1.5 cm lateral to the medial injection site.  -Temporalis muscle injection, 4 sites, bilaterally. The first injection was 3 cm above the tragus of the ear, second injection site was 1.5 cm to 3 cm up from the first injection site in line with the tragus of the ear. The third injection site was 1.5-3 cm forward between the first 2 injection sites. The fourth injection site was 1.5 cm posterior to the second injection site. 5th site laterally in the temporalis  muscleat the level of the outer canthus.  -Occipitalis muscle injection, 3 sites, bilaterally. The first injection was done one half way between the occipital protuberance and the tip of the mastoid process behind the ear. The second injection site was done lateral and superior to the first, 1 fingerbreadth from the first injection. The third injection site was 1 fingerbreadth superiorly and medially from the first injection site.  -Cervical paraspinal muscle injection, 2 sites, bilaterally. The first injection site was 1 cm from the midline of the cervical spine, 3 cm inferior to the lower border of the occipital protuberance. The second injection site was 1.5 cm superiorly and laterally to the first injection site.  -Trapezius muscle injection was performed at 3 sites, bilaterally. The first injection site was in the upper trapezius muscle halfway between the inflection point of the neck, and the acromion. The second injection site was one half way between the acromion and the first injection site. The third injection was done between the first injection site and the inflection point of the  neck.   Will return for repeat injection in 3 months.   A total of 200 units of Botox was prepared, 155 units of Botox was injected as documented above, any Botox not injected was wasted. The patient tolerated the procedure well, there were no complications of the above procedure.

## 2021-05-02 ENCOUNTER — Other Ambulatory Visit: Payer: Self-pay

## 2021-05-02 ENCOUNTER — Ambulatory Visit (INDEPENDENT_AMBULATORY_CARE_PROVIDER_SITE_OTHER): Payer: Managed Care, Other (non HMO) | Admitting: Family Medicine

## 2021-05-02 DIAGNOSIS — G43019 Migraine without aura, intractable, without status migrainosus: Secondary | ICD-10-CM

## 2021-05-02 NOTE — Progress Notes (Signed)
Botox- 200 units x 1 vial Lot: [id number] Expiration: 01/25 NDC: 4503-8882-80  0.9% Sodium Chloride- 22mL total Lot: [id number] Expiration: 12/23 NDC: 9150-5697-94  Dx: G43.019 S/P

## 2021-05-15 ENCOUNTER — Encounter: Payer: Self-pay | Admitting: Family Medicine

## 2021-05-23 ENCOUNTER — Other Ambulatory Visit: Payer: Self-pay

## 2021-05-23 ENCOUNTER — Ambulatory Visit (INDEPENDENT_AMBULATORY_CARE_PROVIDER_SITE_OTHER): Payer: Managed Care, Other (non HMO) | Admitting: Obstetrics and Gynecology

## 2021-05-23 ENCOUNTER — Encounter: Payer: Self-pay | Admitting: Obstetrics and Gynecology

## 2021-05-23 VITALS — BP 120/70 | Ht 64.0 in | Wt 164.0 lb

## 2021-05-23 DIAGNOSIS — N760 Acute vaginitis: Secondary | ICD-10-CM

## 2021-05-23 DIAGNOSIS — Z30431 Encounter for routine checking of intrauterine contraceptive device: Secondary | ICD-10-CM

## 2021-05-23 DIAGNOSIS — B9689 Other specified bacterial agents as the cause of diseases classified elsewhere: Secondary | ICD-10-CM | POA: Diagnosis not present

## 2021-05-23 LAB — POCT WET PREP WITH KOH
Clue Cells Wet Prep HPF POC: POSITIVE
KOH Prep POC: POSITIVE — AB
Trichomonas, UA: NEGATIVE
Yeast Wet Prep HPF POC: NEGATIVE

## 2021-05-23 MED ORDER — METRONIDAZOLE 500 MG PO TABS: 500.0000 mg | ORAL_TABLET | Freq: Two times a day (BID) | ORAL | 0 refills | Status: AC

## 2021-05-23 NOTE — Progress Notes (Signed)
Ronal Fear, NP   Chief Complaint  Patient presents with   Vaginal Discharge    Fishy odor, little itchiness x 1 week    HPI:      [name] is a 40 y.o. G0P0000 whose LMP was No LMP recorded. (Menstrual status: IUD)., presents today for increased vag d/c with fishy odor and irritation for 1 1/2 wks. Sx occurred after newly recent sex activity, using condoms. Hx of BV in distant past. No urin sx, no meds to treat. No recent abx use but getting ready to start doxy for recurrent sinusitis.  Mirena placed 07/08/18; doing well Neg pap/neg HPV DNA 11/19. 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   Migraines     Past Surgical History:  Procedure Laterality Date   CHONDROPLASTY Right 11/10/2014   Procedure: CHONDROPLASTY;  Surgeon: Marcene Corning, MD;  Location: [facility];  Service: Orthopedics;  Laterality: Right;   DIAGNOSTIC LAPAROSCOPY     Prior to 2010 x 2   KNEE ARTHROSCOPY W/ LATERAL RELEASE Right 07/20/2008   KNEE ARTHROSCOPY WITH FULKERSON SLIDE Right 11/10/2014   Procedure: RIGHT KNEE ARTHROSCOPY WITH Conan Bowens;  Surgeon: Marcene Corning, MD;  Location: [facility];  Service: Orthopedics;  Laterality: Right;   KNEE ARTHROSCOPY WITH LATERAL RELEASE Right 11/10/2014   Procedure: KNEE ARTHROSCOPY WITH LATERAL RELEASE;  Surgeon: Marcene Corning, MD;  Location: [facility];  Service: Orthopedics;  Laterality:  Right;   SHOULDER ARTHROSCOPY  2020   Left    TONSILLECTOMY N/A 07/31/2015   Procedure: TONSILLECTOMY;  Surgeon: Geanie Logan, MD;  Location: [facility];  Service: ENT;  Laterality: N/A;   UPPER GI ENDOSCOPY      Family History  Problem Relation Age of Onset   Anesthesia problems Mother        woke up during surgery   Parkinson's disease Paternal Grandfather    Migraines Neg Hx     Social History   Socioeconomic History   Marital status: Single    Spouse name: Not on file   Number of children: 0   Years of education: Master's   Highest education level: Not on file  Occupational History   Occupation: [employer]  Tobacco Use   Smoking status: Former    Packs/day: 0.50    Years: 4.00    Pack years: 2.00    Types: Cigarettes    Quit date: 01/11/2015    Years since quitting: 6.3   Smokeless tobacco: Never   Tobacco comments:       Vaping Use   Vaping Use: Never used  Substance and Sexual Activity   Alcohol use: Yes    Alcohol/week: 1.0 - 2.0 standard drink    Types: 1 - 2 Standard drinks or equivalent per week    Comment: occasionally  Drug use: No   Sexual activity: Yes    Birth control/protection: I.U.D.    Comment: Mirena  Other Topics Concern   Not on file  Social History Narrative   Lives at home w/ her brother   Right-handed   Caffeine: 0-4 cups coffee per day   Social Determinants of Health   Financial Resource Strain: Not on file  Food Insecurity: Not on file  Transportation Needs: Not on file  Physical Activity: Not on file  Stress: Not on file  Social Connections: Not on file  Intimate Partner Violence: Not on file    Outpatient Medications Prior to Visit  Medication Sig Dispense Refill   albuterol (VENTOLIN HFA) 108 (90 Base) MCG/ACT inhaler Inhale into the lungs.     AMZEEQ 4 % FOAM      BOTOX 100 units SOLR injection INJECT 155 UNITS  INTRAMUSCULARLY EVERY 12  WEEKS (GIVEN AT MD OFFICE,  DISCARD UNUSED) 2 each 1    budesonide-formoterol (SYMBICORT) 160-4.5 MCG/ACT inhaler Inhale 2 puffs into the lungs 2 (two) times daily.     cetirizine (ZYRTEC) 10 MG tablet Take 10 mg by mouth daily.     dexlansoprazole (DEXILANT) 60 MG capsule Take 1 capsule by mouth  daily as needed     EPINEPHrine 0.3 mg/0.3 mL IJ SOAJ injection See admin instructions.     GNP 24 HOUR NASAL ALLERGY 55 MCG/ACT AERO nasal inhaler SMARTSIG:Both Nares     levonorgestrel (MIRENA) 20 MCG/24HR IUD 1 each by Intrauterine route once.     lisdexamfetamine (VYVANSE) 70 MG capsule Take 60 mg by mouth every morning.     NURTEC 75 MG TBDP Take 1 tablet by mouth daily as needed. 30 tablet 3   rizatriptan (MAXALT) 10 MG tablet TAKE 1 TABLET BY MOUTH AT ONSET OF MIGRAINE MAY REPEAT IN 2 HRS IF NEEDED 10 tablet 11   Spacer/Aero-Holding Chambers (AEROCHAMBER MV) inhaler See admin instructions.     spironolactone (ALDACTONE) 50 MG tablet Take 50 mg by mouth 2 (two) times daily.     doxycycline (VIBRA-TABS) 100 MG tablet Take 100 mg by mouth 2 (two) times daily. (Patient not taking: Reported on 05/23/2021)     doxycycline (VIBRAMYCIN) 100 MG capsule Take 100 mg by mouth 2 (two) times daily. (Patient not taking: Reported on 05/23/2021)     Albuterol Sulfate 108 (90 BASE) MCG/ACT AEPB Inhale into the lungs as needed.      spironolactone (ALDACTONE) 25 MG tablet Take 25 mg by mouth 2 (two) times daily.     No facility-administered medications prior to visit.      ROS:  Review of Systems  Constitutional:  Negative for fever.  Gastrointestinal:  Negative for blood in stool, constipation, diarrhea, nausea and vomiting.  Genitourinary:  Positive for vaginal discharge. Negative for dyspareunia, dysuria, flank pain, frequency, hematuria, urgency, vaginal bleeding and vaginal pain.  Musculoskeletal:  Negative for back pain.  Skin:  Negative for rash.  BREAST: No symptoms   OBJECTIVE:   Vitals:  BP 120/70   Ht 5\' 4"  (1.626 m)   Wt 164 lb (74.4 kg)    BMI 28.15 kg/m   Physical Exam Vitals reviewed.  Constitutional:      Appearance: She is well-developed.  Pulmonary:     Effort: Pulmonary effort is normal.  Genitourinary:    General: Normal vulva.     Pubic Area: No rash.      Labia:        Right: No rash,  tenderness or lesion.        Left: No rash, tenderness or lesion.      Vagina: Vaginal discharge present. No erythema or tenderness.     Cervix: Normal.     Uterus: Normal. Not enlarged and not tender.      Adnexa: Right adnexa normal and left adnexa normal.       Right: No mass or tenderness.         Left: No mass or tenderness.       Comments: IUD STRINGS IN CX OS Musculoskeletal:        General: Normal range of motion.     Cervical back: Normal range of motion.  Skin:    General: Skin is warm and dry.  Neurological:     General: No focal deficit present.     Mental Status: She is alert and oriented to person, place, and time.  Psychiatric:        Mood and Affect: Mood normal.        Behavior: Behavior normal.        Thought Content: Thought content normal.        Judgment: Judgment normal.    Results: Results for orders placed or performed in visit on 05/23/21 (from the past 24 hour(s))  POCT Wet Prep with KOH     Status: Abnormal   Collection Time: 05/23/21 11:13 AM  Result Value Ref Range   Trichomonas, UA Negative    Clue Cells Wet Prep HPF POC pos    Epithelial Wet Prep HPF POC     Yeast Wet Prep HPF POC neg    Bacteria Wet Prep HPF POC     RBC Wet Prep HPF POC     WBC Wet Prep HPF POC     KOH Prep POC Positive (A) Negative     Assessment/Plan: BV (bacterial vaginosis) - Plan: metroNIDAZOLE (FLAGYL) 500 MG tablet, POCT Wet Prep with KOH; pos sx and wet prep. Rx flagyl, no EtOH. F/u prn. Will RF if sx recur, add probiotics.  Encounter for routine checking of intrauterine contraceptive device (IUD)--IUD strings in cx os, good till 11/27   Meds ordered this encounter  Medications   metroNIDAZOLE  (FLAGYL) 500 MG tablet    Sig: Take 1 tablet (500 mg total) by mouth 2 (two) times daily for 7 days.    Dispense:  14 tablet    Refill:  0    Order Specific Question:   Supervising Provider    Answer:   Nadara Mustard [[id number]]      Return if symptoms worsen or fail to improve.  Ketty Bitton B. Roena Sassaman, PA-C 05/23/2021 11:14 AM

## 2021-05-27 ENCOUNTER — Encounter: Payer: Self-pay | Admitting: Obstetrics and Gynecology

## 2021-05-28 NOTE — Telephone Encounter (Signed)
Contacted patient via phone. Patient states she has an appointment for tomorrow she will wait until then

## 2021-05-29 ENCOUNTER — Other Ambulatory Visit: Payer: Self-pay

## 2021-05-29 ENCOUNTER — Ambulatory Visit (INDEPENDENT_AMBULATORY_CARE_PROVIDER_SITE_OTHER): Payer: Managed Care, Other (non HMO) | Admitting: Obstetrics and Gynecology

## 2021-05-29 ENCOUNTER — Encounter: Payer: Self-pay | Admitting: Obstetrics and Gynecology

## 2021-05-29 VITALS — BP 114/70 | HR 78 | Ht 64.0 in | Wt 162.0 lb

## 2021-05-29 DIAGNOSIS — Z1239 Encounter for other screening for malignant neoplasm of breast: Secondary | ICD-10-CM | POA: Diagnosis not present

## 2021-05-29 DIAGNOSIS — Z113 Encounter for screening for infections with a predominantly sexual mode of transmission: Secondary | ICD-10-CM

## 2021-05-29 DIAGNOSIS — Z01419 Encounter for gynecological examination (general) (routine) without abnormal findings: Secondary | ICD-10-CM

## 2021-05-29 DIAGNOSIS — R300 Dysuria: Secondary | ICD-10-CM

## 2021-05-29 DIAGNOSIS — N951 Menopausal and female climacteric states: Secondary | ICD-10-CM

## 2021-05-29 DIAGNOSIS — Z124 Encounter for screening for malignant neoplasm of cervix: Secondary | ICD-10-CM | POA: Diagnosis not present

## 2021-05-29 DIAGNOSIS — N760 Acute vaginitis: Secondary | ICD-10-CM

## 2021-05-29 NOTE — Progress Notes (Signed)
Gynecology Annual Exam  PCP: Ronal Fear, NP  Chief Complaint:  Chief Complaint  Patient presents with   Gynecologic Exam    Annual - treated for BV 05/23/21 still having symptoms, urinary frequency. Procedure RM     History of Present Illness: Patient is a 40 y.o. G0P0000 presents for annual exam. The patient has no complaints today.   LMP: No LMP recorded. (Menstrual status: IUD). Avmenorrhea Mirena IUD placed 07/08/2018  The patient is sexually active. She currently uses IUD for contraception. She denies dyspareunia.  The patient does perform self breast exams.  There is no notable family history of breast or ovarian cancer in her family.  The patient wears seatbelts: yes.   The patient has regular exercise: not asked.    The patient denies current symptoms of depression.    Review of Systems: Review of Systems  Constitutional:  Negative for chills and fever.  HENT:  Negative for congestion.   Respiratory:  Negative for cough and shortness of breath.   Cardiovascular:  Negative for chest pain and palpitations.  Gastrointestinal:  Negative for abdominal pain, constipation, diarrhea, heartburn, nausea and vomiting.  Genitourinary:  Negative for dysuria, frequency and urgency.  Skin:  Negative for itching and rash.  Neurological:  Negative for dizziness and headaches.  Endo/Heme/Allergies:  Negative for polydipsia.  Psychiatric/Behavioral:  Negative for depression.    Past Medical History:  Patient Active Problem List   Diagnosis Date Noted   Personal history of urinary calculi 12/15/2020   Patellar instability 05/30/2019   Common migraine with intractable migraine 09/23/2016   Low back pain 08/12/2016   Shoulder pain 08/12/2016   Hemorrhoids, external without complications 05/21/2011    Past Surgical History:  Past Surgical History:  Procedure Laterality Date   CHONDROPLASTY Right 11/10/2014   Procedure: CHONDROPLASTY;  Surgeon: Marcene Corning, MD;  Location:  [facility];  Service: Orthopedics;  Laterality: Right;   DIAGNOSTIC LAPAROSCOPY     Prior to 2010 x 2   KNEE ARTHROSCOPY W/ LATERAL RELEASE Right 07/20/2008   KNEE ARTHROSCOPY WITH FULKERSON SLIDE Right 11/10/2014   Procedure: RIGHT KNEE ARTHROSCOPY WITH Conan Bowens;  Surgeon: Marcene Corning, MD;  Location: [facility];  Service: Orthopedics;  Laterality: Right;   KNEE ARTHROSCOPY WITH LATERAL RELEASE Right 11/10/2014   Procedure: KNEE ARTHROSCOPY WITH LATERAL RELEASE;  Surgeon: Marcene Corning, MD;  Location:  S[facility];  Service: Orthopedics;  Laterality: Right;   SHOULDER ARTHROSCOPY  2020   Left    TONSILLECTOMY N/A 07/31/2015   Procedure: TONSILLECTOMY;  Surgeon: Geanie Logan, MD;  Location: [facility];  Service: ENT;  Laterality: N/A;   UPPER GI ENDOSCOPY      Gynecologic History:  No LMP recorded. (Menstrual status: IUD). Contraception: IUD Last Pap: Results were: 11/17/2019NIL and HR HPV negative   Obstetric History: G0P0000  Family History:  Family History  Problem Relation Age of Onset   Anesthesia problems Mother        woke up during surgery   Parkinson's disease Paternal Grandfather    Migraines Neg Hx     Social History:  Social History   Socioeconomic History   Marital status: Single    Spouse name: Not on file   Number of children: 0   Years of education: Master's   Highest education level: Not on file  Occupational History   Occupation: [employer]  Tobacco Use   Smoking status: Former    Packs/day: 0.50  Years: 4.00    Pack years: 2.00    Types: Cigarettes    Quit date: 01/11/2015    Years since quitting: 6.3   Smokeless tobacco: Never   Tobacco comments:       Vaping Use   Vaping Use: Never used  Substance and Sexual Activity   Alcohol use: Yes    Alcohol/week: 1.0 - 2.0 standard drink    Types: 1 - 2 Standard drinks or equivalent per week    Comment: occasionally   Drug use: No   Sexual  activity: Yes    Birth control/protection: I.U.D.    Comment: Mirena  Other Topics Concern   Not on file  Social History Narrative   Lives at home w/ her brother   Right-handed   Caffeine: 0-4 cups coffee per day   Social Determinants of Health   Financial Resource Strain: Not on file  Food Insecurity: Not on file  Transportation Needs: Not on file  Physical Activity: Not on file  Stress: Not on file  Social Connections: Not on file  Intimate Partner Violence: Not on file    Allergies:  Allergies  Allergen Reactions   Sulfa Antibiotics Nausea And Vomiting   Percocet [Oxycodone-Acetaminophen] Nausea And Vomiting   Propranolol     Rash   Topamax [Topiramate]     Nausea    Medications: Prior to Admission medications   Medication Sig Start Date End Date Taking? Authorizing Provider  albuterol (VENTOLIN HFA) 108 (90 Base) MCG/ACT inhaler Inhale into the lungs. 05/21/21   [provider]  AMZEEQ 4 % FOAM  06/04/19   [provider]  BOTOX 100 units SOLR injection INJECT 155 UNITS  INTRAMUSCULARLY EVERY 12  WEEKS (GIVEN AT MD OFFICE,  DISCARD UNUSED) 09/12/19   Lomax, Amy, NP  budesonide-formoterol (SYMBICORT) 160-4.5 MCG/ACT inhaler Inhale 2 puffs into the lungs 2 (two) times daily.    [provider]  cetirizine (ZYRTEC) 10 MG tablet Take 10 mg by mouth daily.    [provider]  dexlansoprazole (DEXILANT) 60 MG capsule Take 1 capsule by mouth  daily as needed 04/15/16   [provider]  doxycycline (VIBRA-TABS) 100 MG tablet Take 100 mg by mouth 2 (two) times daily. Patient not taking: Reported on 05/23/2021 02/18/21   [provider]  doxycycline (VIBRAMYCIN) 100 MG capsule Take 100 mg by mouth 2 (two) times daily. Patient not taking: Reported on 05/23/2021 05/22/21   [provider]  EPINEPHrine 0.3 mg/0.3 mL IJ SOAJ injection See admin instructions. 04/12/20   [provider]  GNP 24 HOUR NASAL ALLERGY 55  MCG/ACT AERO nasal inhaler SMARTSIG:Both Nares 02/08/21   [provider]  levonorgestrel (MIRENA) 20 MCG/24HR IUD 1 each by Intrauterine route once.    [provider]  lisdexamfetamine (VYVANSE) 70 MG capsule Take 60 mg by mouth every morning. 01/12/21   [provider]  metroNIDAZOLE (FLAGYL) 500 MG tablet Take 1 tablet (500 mg total) by mouth 2 (two) times daily for 7 days. 05/23/21 05/30/21  Copland, Alicia B, PA-C  NURTEC 75 MG TBDP Take 1 tablet by mouth daily as needed. 04/08/21   Lomax, Amy, NP  rizatriptan (MAXALT) 10 MG tablet TAKE 1 TABLET BY MOUTH AT ONSET OF MIGRAINE MAY REPEAT IN 2 HRS IF NEEDED 11/03/18   York Spaniel, MD  Spacer/Aero-Holding Chambers (AEROCHAMBER MV) inhaler See admin instructions.    [provider]  spironolactone (ALDACTONE) 50 MG tablet Take 50 mg by mouth 2 (  two) times daily. 04/24/21   [provider]    Physical Exam Vitals: Blood pressure 114/70, pulse 78, height 5\' 4"  (1.626 m), weight 162 lb (73.5 kg).  General: NAD HEENT: normocephalic, anicteric Thyroid: no enlargement, no palpable nodules Pulmonary: No increased work of breathing, CTAB Cardiovascular: RRR, distal pulses 2+ Breast: Breast symmetrical, no tenderness, no palpable nodules or masses, no skin or nipple retraction present, no nipple discharge.  No axillary or supraclavicular lymphadenopathy. Abdomen: NABS, soft, non-tender, non-distended.  Umbilicus without lesions.  No hepatomegaly, splenomegaly or masses palpable. No evidence of hernia  Genitourinary:  External: Normal external female genitalia.  Normal urethral meatus, normal Bartholin's and Skene's glands.    Vagina: Normal vaginal mucosa, no evidence of prolapse.    Cervix: Grossly normal in appearance, no bleeding  Uterus: Non-enlarged, mobile, normal contour.  No CMT  Adnexa: ovaries non-enlarged, no adnexal masses  Rectal: deferred  Lymphatic: no evidence of inguinal  lymphadenopathy Extremities: no edema, erythema, or tenderness Neurologic: Grossly intact Psychiatric: mood appropriate, affect full  Female chaperone present for pelvic and breast  portions of the physical exam    Assessment: 40 y.o. G0P0000 routine annual exam  Plan: Problem List Items Addressed This Visit   None Visit Diagnoses     Encounter for gynecological examination without abnormal finding    -  Primary   Screening for malignant neoplasm of cervix       Relevant Orders   IGP, Aptima HPV, rfx 16/18,45   Breast screening       Relevant Orders   MM 3D SCREEN BREAST BILATERAL   Routine screening for STI (sexually transmitted infection)       Relevant Orders   NuSwab Vaginitis Plus (VG+)   Acute vaginitis       Relevant Orders   NuSwab Vaginitis Plus (VG+)   Perimenopausal       Relevant Orders   Follicle stimulating hormone (Completed)   Estradiol (Completed)   Dysuria       Relevant Orders   Urine Culture       1) Mammogram - recommend yearly screening mammogram.  Mammogram Was ordered today   2) STI screening  was notoffered and therefore not obtained  3) ASCCP guidelines and rational discussed.  Patient opts for every 3 years screening interval  4) Contraception - the patient is currently using  IUD.  She is happy with her current form of contraception and plans to continue  5) Colonoscopy -- Screening recommended starting at age 49 for average risk individuals, age 63 for individuals deemed at increased risk (including African Americans) and recommended to continue until age 25.  For patient age 25-85 individualized approach is recommended.  Gold standard screening is via colonoscopy, Cologuard screening is an acceptable alternative for patient unwilling or unable to undergo colonoscopy.  "Colorectal cancer screening for average?risk adults: 2018 guideline update from the American Cancer Society"CA: A Cancer Journal for Clinicians: Jan 28, 2017   6) Routine  healthcare maintenance including cholesterol, diabetes screening discussed managed by PCP  7) Ovarian function - would like checked FSH/estradiol  8) Nuswab sent was treated for BV previously  9)  Return in about 1 year (around 05/29/2022) for annual.   05/31/2022, MD, [facility], [facility] 05/29/2021, 11:00 AM

## 2021-05-29 NOTE — Patient Instructions (Signed)
[facility] [address] [place] [address]  [facility]  [address] [place] [address]  Phone: [phone]  

## 2021-05-30 LAB — FOLLICLE STIMULATING HORMONE: FSH: 3.4 m[IU]/mL

## 2021-05-30 LAB — ESTRADIOL: Estradiol: 141 pg/mL

## 2021-06-01 LAB — IGP, APTIMA HPV, RFX 16/18,45
HPV Aptima: NEGATIVE
PAP Smear Comment: 0

## 2021-06-01 LAB — NUSWAB VAGINITIS PLUS (VG+)
Candida albicans, NAA: NEGATIVE
Candida glabrata, NAA: NEGATIVE
Chlamydia trachomatis, NAA: NEGATIVE
Neisseria gonorrhoeae, NAA: NEGATIVE
Trich vag by NAA: NEGATIVE

## 2021-06-02 LAB — URINE CULTURE

## 2021-06-12 ENCOUNTER — Other Ambulatory Visit: Payer: Self-pay | Admitting: *Deleted

## 2021-06-12 DIAGNOSIS — G43019 Migraine without aura, intractable, without status migrainosus: Secondary | ICD-10-CM

## 2021-06-12 MED ORDER — BOTOX 100 UNITS IJ SOLR: INTRAMUSCULAR | 1 refills | Status: DC

## 2021-07-03 ENCOUNTER — Other Ambulatory Visit: Payer: Self-pay

## 2021-07-03 ENCOUNTER — Ambulatory Visit
Admission: RE | Admit: 2021-07-03 | Discharge: 2021-07-03 | Disposition: A | Discharge status: Home / Self Care | Payer: Managed Care, Other (non HMO) | Source: Ambulatory Visit | Attending: Obstetrics and Gynecology | Admitting: Obstetrics and Gynecology

## 2021-07-03 DIAGNOSIS — Z1239 Encounter for other screening for malignant neoplasm of breast: Secondary | ICD-10-CM

## 2021-07-03 DIAGNOSIS — Z1231 Encounter for screening mammogram for malignant neoplasm of breast: Secondary | ICD-10-CM | POA: Diagnosis not present

## 2021-07-15 ENCOUNTER — Encounter: Payer: Self-pay | Admitting: Obstetrics and Gynecology

## 2021-07-15 ENCOUNTER — Encounter: Payer: Self-pay | Admitting: Family Medicine

## 2021-07-16 ENCOUNTER — Other Ambulatory Visit: Payer: Self-pay | Admitting: *Deleted

## 2021-07-16 MED ORDER — METHYLPREDNISOLONE 4 MG PO TBPK: ORAL_TABLET | ORAL | 0 refills | Status: DC

## 2021-07-17 ENCOUNTER — Telehealth: Payer: Self-pay | Admitting: *Deleted

## 2021-07-17 NOTE — Telephone Encounter (Signed)
Initiated PA Nurtec on CMM. [id number]. In process of completing.

## 2021-07-18 NOTE — Telephone Encounter (Signed)
Request Reference Number: [id number]. NURTEC TAB 75MG  ODT is approved through 07/18/2022. Your patient may now fill this prescription and it will be covered.

## 2021-07-22 ENCOUNTER — Encounter: Payer: Self-pay | Admitting: Family Medicine

## 2021-07-22 ENCOUNTER — Telehealth (INDEPENDENT_AMBULATORY_CARE_PROVIDER_SITE_OTHER): Payer: Managed Care, Other (non HMO) | Admitting: Family Medicine

## 2021-07-22 DIAGNOSIS — G43019 Migraine without aura, intractable, without status migrainosus: Secondary | ICD-10-CM | POA: Diagnosis not present

## 2021-07-22 MED ORDER — AJOVY 225 MG/1.5ML ~~LOC~~ SOAJ: 225.0000 mg | SUBCUTANEOUS | 3 refills | Status: DC

## 2021-07-22 MED ORDER — ONDANSETRON HCL 4 MG PO TABS: 4.0000 mg | ORAL_TABLET | Freq: Three times a day (TID) | ORAL | 0 refills | Status: DC | PRN

## 2021-07-22 NOTE — Patient Instructions (Addendum)
Below is our plan:  We will continue Botox. I will add Ajovy injections every month. Go to [email or web address] download a copay card and take with you to the pharmacy. I will call in ondansetron for nausea. Take this with Nurtec, Tylenol 1000mg , ibuprofen 800mg  and Benadryl 50mg  to break prolonged migraine. You can repeat ondansetron every 4-6 hours, Tylenol every 4-6 hours, ibuprofen every 8 hours and Benadryl every 6 hours as needed. Only take 1 Nurtec in 24 hour period. Make sure you are drinking lots of water (50-60 ounces daily). I will see you for your next Botox procedure 12/6 at 10:15. Please let me know if you need me!  Please make sure you are staying well hydrated. I recommend 50-60 ounces daily. Well balanced diet and regular exercise encouraged. Consistent sleep schedule with 6-8 hours recommended.   Please continue follow up with care team as directed.   Follow up with me in 6 months   You may receive a survey regarding today's visit. I encourage you to leave honest feed back as I do use this information to improve patient care. Thank you for seeing me today!

## 2021-07-22 NOTE — Progress Notes (Signed)
PATIENT: Kesha Hurrell Tangredi DOB: [birth year]  REASON FOR VISIT: follow up HISTORY FROM: patient  Virtual Visit via Telephone Note  I connected with Lasharon L Thul on 07/22/21 at 10:00 AM EST by telephone and verified that I am speaking with the correct person using two identifiers.   I discussed the limitations, risks, security and privacy concerns of performing an evaluation and management service by telephone and the availability of in person appointments. I also discussed with the patient that there may be a patient responsible charge related to this service. The patient expressed understanding and agreed to proceed.   History of Present Illness:  07/22/21 ALL: ROBECCA FULGHAM is a 40 y.o. female here today for follow up for worsening migraines. She has been doing well on Botox since 2019. Over the past few months, headaches have been more intense and lasting days at a time. She uses Nurtec. Rizatriptan not continued due to making her feel "like brain is melting". She has taken several rounds of prednisone tapers since 04/2021. It always gives her some relief but headache returns. Nurtec does help. She gets more nausea when she takes Nurtec with OTC analgesics. She has taken ondansetron in the past that helps. She has not tried CGRP. She does endorse more stress at work and is probably not sleeping as well. Weather changes are major trigger for her.   Meds tried and failed: Botox (on now), gabapentin, venlafaxine, propranolol, topiramate, Nurtec, rizatriptan, sumatriptan, eletriptan, dexamethasone, methylprednisolone, diclofenac   History (copied from Dr Anne Hahn' previous note 07/01/2018)  HISTORY: Judine Arciniega is a 40 year old patient with a history of intractable migraine headache.  The patient has had an excellent benefit from the Botox, over the last 3 months, she has not had to take any of her Maxalt.  She is quite pleased with the benefits she has gotten from Botox, she returns  for another therapy.  Observations/Objective:  Generalized: Well developed, in no acute distress  Mentation: Alert oriented to time, place, history taking. Follows all commands speech and language fluent   Assessment and Plan:  40 y.o. year old female  has a past medical history of Acid reflux, ADHD, Asthma, Chondromalacia of right patella (10/2014), Common migraine with intractable migraine (09/23/2016), Complication of anesthesia, Dental bridge present (upper), Dental crown present, Dumping syndrome, Endometriosis, Family history of adverse reaction to anesthesia, GERD (gastroesophageal reflux disease), and Migraines. here with    ICD-10-CM   1. Common migraine with intractable migraine  G43.019       Taiyana is having more frequent, more intense migraines. We will continue Botox for now. I will start Ajovy every 30 days. I will have her continue Nurtec but will have her take with ibuprofen, Tylenol and Benadryl as directed. Appropriate dosing discussed. Sleep hygiene advised. She will follow up for Botox procedure 12/6 at 10:15. She may call sooner if needed.   No orders of the defined types were placed in this encounter.   Meds ordered this encounter  Medications   Fremanezumab-vfrm (AJOVY) 225 MG/1.5ML SOAJ    Sig: Inject 225 mg into the skin every 30 (thirty) days.    Dispense:  4.5 mL    Refill:  3    Order Specific Question:   Supervising Provider    Answer:   Anson Fret [[id number]]   ondansetron (ZOFRAN) 4 MG tablet    Sig: Take 1 tablet (4 mg total) by mouth every 8 (eight) hours as needed for nausea or vomiting.  Dispense:  20 tablet    Refill:  0    Order Specific Question:   Supervising Provider    Answer:   Anson Fret [id number]      Follow Up Instructions:  I discussed the assessment and treatment plan with the patient. The patient was provided an opportunity to ask questions and all were answered. The patient agreed with the plan and demonstrated  an understanding of the instructions.   The patient was advised to call back or seek an in-person evaluation if the symptoms worsen or if the condition fails to improve as anticipated.  I provided 15 minutes of non-face-to-face time during this encounter. Patient located at their place of residence during Mychart visit. Provider is in the office.    Shawnie Dapper, NP

## 2021-07-23 ENCOUNTER — Telehealth: Payer: Self-pay | Admitting: *Deleted

## 2021-07-23 NOTE — Telephone Encounter (Signed)
Submitted PA Ajovy on CMM. Key: [id number] - PA Case ID: [id number] - Rx #: [id number]. Waiting on determination from [facility].

## 2021-07-23 NOTE — Telephone Encounter (Signed)
PA denied for the following reason:   "Per your health plan's criteria, this drug is covered if you meet the following: (1) One of the following: (A) All of the following: (I) You have a type of headache condition (episodic migraines). (II) You have 4 to 14 migraine days per month, but no more than 14 headache days per month. (B) All of the following: (I) You have a type of headache condition (chronic migraines). (II) Medication overuse headache has been considered and you have stopped drugs that can possibly cause this type of headache (potentially offending drugs have been discontinued). (III) You have greater than or equal to 15 headache days per month, of which at least 8 must be migraine days for at least 3 months. (2) Two of the following: (A) You have tried amitriptyline or venlafaxine for at least two months or cannot use the drugs. (B) You have tried divalproex sodium or topiramate for at least two months or cannot use the drugs. (C) You have tried one beta blocker drug (that is, atenolol, propranolol, nadolol, timolol, or metoprolol) for at least two months or cannot use the drugs. (D) You have tried candesartan for at least two months or cannot use the drug. The information provided does not show that you meet the criteria listed above"

## 2021-07-24 NOTE — Telephone Encounter (Signed)
Faxed appeal letter to [employer] appeals department at [phone]. Received fax confirmation.

## 2021-07-25 ENCOUNTER — Encounter: Payer: Self-pay | Admitting: Family Medicine

## 2021-07-29 NOTE — Telephone Encounter (Signed)
PA for Botox through [employer] expired 07/10/21. New PA was initiated on 11/10. Notes were faxed in with PA. I called [employer] today and spoke with Genevie Cheshire, she states PA request is still being reviewed.

## 2021-07-29 NOTE — Telephone Encounter (Signed)
PA approved for the patient until 01/21/2022 through [employer]

## 2021-07-31 NOTE — Telephone Encounter (Signed)
Received approval from [place]. PA #[id number] (07/22/21- 07/22/22).

## 2021-08-01 ENCOUNTER — Other Ambulatory Visit: Payer: Self-pay | Admitting: Neurology

## 2021-08-01 DIAGNOSIS — G43019 Migraine without aura, intractable, without status migrainosus: Secondary | ICD-10-CM

## 2021-08-01 MED ORDER — BOTOX 100 UNITS IJ SOLR: INTRAMUSCULAR | 1 refills | Status: DC

## 2021-08-01 NOTE — Telephone Encounter (Signed)
I called [employer] @ [phone] and spoke with Dorene Grebe to change the specialty pharmacy on patient's Botox PA to [facility] instead of [facility]. Dorene Grebe was able to update the PA.

## 2021-08-05 NOTE — Progress Notes (Signed)
08/06/2021 ALL: She continues Botox and Nurtec. We started Ajovy about 2 weeks ago after worsening and prolonged migraines. She has not picked up from pharmacy yet. She was encouraged to take Nurtec with Tylenol, ibuprofen and Benadryl. She is doing a little better but continues to have regular headache days, especially with storms.   05/02/2021 ALL: She continues Botox and Nurtec. She is doing well. She did have an intractable migraine about 2 weeks ago. Decadron on back order and medrol dose pack was given. It did help.   01/30/2021 ALL: She continues to do well with Botox therapy. Nurtec works well for abortive therapy. She will occasionally use rizatriptan if Nurtec not effective. She has had 1 migraine this month after being out of Vyvanse for a few days.   10/25/2020 ALL: She is doing well. Botox continues to be effective in migraine management. Nurtec helps with abortive therapy.   07/24/2020 ALL: She is doing well. Migraines are well managed. She has received Nurtec and it is working well. She was recently started on Vyvanse for ADHD. No worsening in migraines thus far.   Consent Form Botulism Toxin Injection For Chronic Migraine    Reviewed orally with patient, additionally signature is on file:  Botulism toxin has been approved by the Federal drug administration for treatment of chronic migraine. Botulism toxin does not cure chronic migraine and it may not be effective in some patients.  The administration of botulism toxin is accomplished by injecting a small amount of toxin into the muscles of the neck and head. Dosage must be titrated for each individual. Any benefits resulting from botulism toxin tend to wear off after 3 months with a repeat injection required if benefit is to be maintained. Injections are usually done every 3-4 months with maximum effect peak achieved by about 2 or 3 weeks. Botulism toxin is expensive and you should be sure of what costs you will incur resulting from  the injection.  The side effects of botulism toxin use for chronic migraine may include:   -Transient, and usually mild, facial weakness with facial injections  -Transient, and usually mild, head or neck weakness with head/neck injections  -Reduction or loss of forehead facial animation due to forehead muscle weakness  -Eyelid drooping  -Dry eye  -Pain at the site of injection or bruising at the site of injection  -Double vision  -Potential unknown long term risks   Contraindications: You should not have Botox if you are pregnant, nursing, allergic to albumin, have an infection, skin condition, or muscle weakness at the site of the injection, or have myasthenia gravis, Lambert-Eaton syndrome, or ALS.  It is also possible that as with any injection, there may be an allergic reaction or no effect from the medication. Reduced effectiveness after repeated injections is sometimes seen and rarely infection at the injection site may occur. All care will be taken to prevent these side effects. If therapy is given over a long time, atrophy and wasting in the muscle injected may occur. Occasionally the patient's become refractory to treatment because they develop antibodies to the toxin. In this event, therapy needs to be modified.  I have read the above information and consent to the administration of botulism toxin.   BOTOX PROCEDURE NOTE FOR MIGRAINE HEADACHE  Contraindications and precautions discussed with patient(above). Aseptic procedure was observed and patient tolerated procedure. Procedure performed by Shawnie Dapper, FNP-C.   The condition has existed for more than 6 months, and pt does not have  a diagnosis of ALS, Myasthenia Gravis or Lambert-Eaton Syndrome.  Risks and benefits of injections discussed and pt agrees to proceed with the procedure.  Written consent obtained  These injections are medically necessary. Pt  receives good benefits from these injections. These injections do not cause  sedations or hallucinations which the oral therapies may cause.   Description of procedure:  The patient was placed in a sitting position. The standard protocol was used for Botox as follows, with 5 units of Botox injected at each site:  -Procerus muscle, midline injection  -Corrugator muscle, bilateral injection  -Frontalis muscle, bilateral injection, with 2 sites each side, medial injection was performed in the upper one third of the frontalis muscle, in the region vertical from the medial inferior edge of the superior orbital rim. The lateral injection was again in the upper one third of the forehead vertically above the lateral limbus of the cornea, 1.5 cm lateral to the medial injection site.  -Temporalis muscle injection, 4 sites, bilaterally. The first injection was 3 cm above the tragus of the ear, second injection site was 1.5 cm to 3 cm up from the first injection site in line with the tragus of the ear. The third injection site was 1.5-3 cm forward between the first 2 injection sites. The fourth injection site was 1.5 cm posterior to the second injection site. 5th site laterally in the temporalis  muscleat the level of the outer canthus.  -Occipitalis muscle injection, 3 sites, bilaterally. The first injection was done one half way between the occipital protuberance and the tip of the mastoid process behind the ear. The second injection site was done lateral and superior to the first, 1 fingerbreadth from the first injection. The third injection site was 1 fingerbreadth superiorly and medially from the first injection site.  -Cervical paraspinal muscle injection, 2 sites, bilaterally. The first injection site was 1 cm from the midline of the cervical spine, 3 cm inferior to the lower border of the occipital protuberance. The second injection site was 1.5 cm superiorly and laterally to the first injection site.  -Trapezius muscle injection was performed at 3 sites, bilaterally. The  first injection site was in the upper trapezius muscle halfway between the inflection point of the neck, and the acromion. The second injection site was one half way between the acromion and the first injection site. The third injection was done between the first injection site and the inflection point of the neck.   Will return for repeat injection in 3 months.   A total of 200 units of Botox was prepared, 155 units of Botox was injected as documented above, any Botox not injected was wasted. The patient tolerated the procedure well, there were no complications of the above procedure.

## 2021-08-06 ENCOUNTER — Encounter: Payer: Self-pay | Admitting: Family Medicine

## 2021-08-06 ENCOUNTER — Ambulatory Visit: Payer: Managed Care, Other (non HMO) | Admitting: Family Medicine

## 2021-08-06 ENCOUNTER — Other Ambulatory Visit: Payer: Self-pay

## 2021-08-06 DIAGNOSIS — G43019 Migraine without aura, intractable, without status migrainosus: Secondary | ICD-10-CM

## 2021-08-06 NOTE — Progress Notes (Signed)
Botox- 200 units x 1 vial Lot: [id number] Expiration: 03/25 NDC: 0865-7846-96  0.9% Sodium Chloride- 35mL total Lot: [id number] Expiration: 12/23 NDC: 32440-102-72  Dx: Z36.644 SAMPLE

## 2021-08-14 ENCOUNTER — Ambulatory Visit (INDEPENDENT_AMBULATORY_CARE_PROVIDER_SITE_OTHER): Payer: Managed Care, Other (non HMO) | Admitting: Podiatry

## 2021-08-14 ENCOUNTER — Other Ambulatory Visit: Payer: Self-pay

## 2021-08-14 ENCOUNTER — Encounter: Payer: Self-pay | Admitting: Podiatry

## 2021-08-14 ENCOUNTER — Ambulatory Visit (INDEPENDENT_AMBULATORY_CARE_PROVIDER_SITE_OTHER): Payer: Managed Care, Other (non HMO)

## 2021-08-14 DIAGNOSIS — M778 Other enthesopathies, not elsewhere classified: Secondary | ICD-10-CM | POA: Diagnosis not present

## 2021-08-14 DIAGNOSIS — L501 Idiopathic urticaria: Secondary | ICD-10-CM | POA: Insufficient documentation

## 2021-08-14 DIAGNOSIS — J301 Allergic rhinitis due to pollen: Secondary | ICD-10-CM | POA: Insufficient documentation

## 2021-08-14 DIAGNOSIS — J309 Allergic rhinitis, unspecified: Secondary | ICD-10-CM | POA: Insufficient documentation

## 2021-08-14 DIAGNOSIS — J3081 Allergic rhinitis due to animal (cat) (dog) hair and dander: Secondary | ICD-10-CM | POA: Insufficient documentation

## 2021-08-14 DIAGNOSIS — J4530 Mild persistent asthma, uncomplicated: Secondary | ICD-10-CM | POA: Insufficient documentation

## 2021-08-14 NOTE — Progress Notes (Signed)
She presents today chief complaint of a numb tip to her second digit of her right foot.  She states that the nail has come off twice now and she is noticing numbness.  She states that her toenails used to hit the end of her shoe but she has since changed shoes so they no longer do so.  She states that there is no cuts no bleeding but just numbness at the very tip of the toe near the nail.  Objective: Vital signs stable alert oriented x3 there is no erythema edema cellulitis drainage or odor right foot.  Toe sits rectus there is no reproducible pain though there is some numbness to the tip of the toe right over a callused area.  The callus is not overly thick but most likely secondary to some mild hammertoe deformity or flexible digital deformity.  Radiographs taken today do not demonstrate any type of osseous abnormalities in this area a screw from the first metatarsal osteotomy is intact and in good position and the bone has healed well.  Her first metatarsophalangeal joint is still in good position.  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  Plan: Discussed etiology pathology conservative surgical therapies at this point I recommended that she follow-up with [place] on an as-needed basis explained to her the possibility that nail will continue to delaminate.

## 2021-10-30 NOTE — Progress Notes (Signed)
? ?10/31/2021 ALL: She returns for Botox. Ajovy seems to be helping. Migraines are less frequent and less severe. Nurtec helps with abortive therapy. She is feeling well, today  ? ?08/06/2021 ALL: She continues Botox and Nurtec. We started Ajovy about 2 weeks ago after worsening and prolonged migraines. She has not picked up from pharmacy yet. She was encouraged to take Nurtec with Tylenol, ibuprofen and Benadryl. She is doing a little better but continues to have regular headache days, especially with storms.  ? ?05/02/2021 ALL: She continues Botox and Nurtec. She is doing well. She did have an intractable migraine about 2 weeks ago. Decadron on back order and medrol dose pack was given. It did help.  ? ?01/30/2021 ALL: She continues to do well with Botox therapy. Nurtec works well for abortive therapy. She will occasionally use rizatriptan if Nurtec not effective. She has had 1 migraine this month after being out of Vyvanse for a few days.  ? ?10/25/2020 ALL: She is doing well. Botox continues to be effective in migraine management. Nurtec helps with abortive therapy.  ? ?07/24/2020 ALL: She is doing well. Migraines are well managed. She has received Nurtec and it is working well. She was recently started on Vyvanse for ADHD. No worsening in migraines thus far.  ? ?Consent Form ?Botulism Toxin Injection For Chronic Migraine ? ? ? ?Reviewed orally with patient, additionally signature is on file: ? ?Botulism toxin has been approved by the Federal drug administration for treatment of chronic migraine. Botulism toxin does not cure chronic migraine and it may not be effective in some patients. ? ?The administration of botulism toxin is accomplished by injecting a small amount of toxin into the muscles of the neck and head. Dosage must be titrated for each individual. Any benefits resulting from botulism toxin tend to wear off after 3 months with a repeat injection required if benefit is to be maintained. Injections are  usually done every 3-4 months with maximum effect peak achieved by about 2 or 3 weeks. Botulism toxin is expensive and you should be sure of what costs you will incur resulting from the injection. ? ?The side effects of botulism toxin use for chronic migraine may include: ? ? -Transient, and usually mild, facial weakness with facial injections ? -Transient, and usually mild, head or neck weakness with head/neck injections ? -Reduction or loss of forehead facial animation due to forehead muscle weakness ? -Eyelid drooping ? -Dry eye ? -Pain at the site of injection or bruising at the site of injection ? -Double vision ? -Potential unknown long term risks ? ? ?Contraindications: You should not have Botox if you are pregnant, nursing, allergic to albumin, have an infection, skin condition, or muscle weakness at the site of the injection, or have myasthenia gravis, Lambert-Eaton syndrome, or ALS. ? ?It is also possible that as with any injection, there may be an allergic reaction or no effect from the medication. Reduced effectiveness after repeated injections is sometimes seen and rarely infection at the injection site may occur. All care will be taken to prevent these side effects. If therapy is given over a long time, atrophy and wasting in the muscle injected may occur. Occasionally the patient's become refractory to treatment because they develop antibodies to the toxin. In this event, therapy needs to be modified. ? ?I have read the above information and consent to the administration of botulism toxin. ? ? ?BOTOX PROCEDURE NOTE FOR MIGRAINE HEADACHE ? ?Contraindications and precautions discussed with patient(above).  Aseptic procedure was observed and patient tolerated procedure. Procedure performed by Shawnie Dapper, FNP-C.  ? ?The condition has existed for more than 6 months, and pt does not have a diagnosis of ALS, Myasthenia Gravis or Lambert-Eaton Syndrome.  Risks and benefits of injections discussed and pt agrees  to proceed with the procedure.  Written consent obtained ? ?These injections are medically necessary. Pt  receives good benefits from these injections. These injections do not cause sedations or hallucinations which the oral therapies may cause. ? ? ?Description of procedure: ? ?The patient was placed in a sitting position. The standard protocol was used for Botox as follows, with 5 units of Botox injected at each site: ? ?-Procerus muscle, midline injection ? ?-Corrugator muscle, bilateral injection ? ?-Frontalis muscle, bilateral injection, with 2 sites each side, medial injection was performed in the upper one third of the frontalis muscle, in the region vertical from the medial inferior edge of the superior orbital rim. The lateral injection was again in the upper one third of the forehead vertically above the lateral limbus of the cornea, 1.5 cm lateral to the medial injection site. ? ?-Temporalis muscle injection, 4 sites, bilaterally. The first injection was 3 cm above the tragus of the ear, second injection site was 1.5 cm to 3 cm up from the first injection site in line with the tragus of the ear. The third injection site was 1.5-3 cm forward between the first 2 injection sites. The fourth injection site was 1.5 cm posterior to the second injection site. 5th site laterally in the temporalis  muscleat the level of the outer canthus. ? ?-Occipitalis muscle injection, 3 sites, bilaterally. The first injection was done one half way between the occipital protuberance and the tip of the mastoid process behind the ear. The second injection site was done lateral and superior to the first, 1 fingerbreadth from the first injection. The third injection site was 1 fingerbreadth superiorly and medially from the first injection site. ? ?-Cervical paraspinal muscle injection, 2 sites, bilaterally. The first injection site was 1 cm from the midline of the cervical spine, 3 cm inferior to the lower border of the occipital  protuberance. The second injection site was 1.5 cm superiorly and laterally to the first injection site. ? ?-Trapezius muscle injection was performed at 3 sites, bilaterally. The first injection site was in the upper trapezius muscle halfway between the inflection point of the neck, and the acromion. The second injection site was one half way between the acromion and the first injection site. The third injection was done between the first injection site and the inflection point of the neck. ? ? ?Will return for repeat injection in 3 months. ? ? ?A total of 200 units of Botox was prepared, 155 units of Botox was injected as documented above, any Botox not injected was wasted. The patient tolerated the procedure well, there were no complications of the above procedure. ? ? ?

## 2021-10-31 ENCOUNTER — Ambulatory Visit: Payer: Managed Care, Other (non HMO) | Admitting: Family Medicine

## 2021-10-31 DIAGNOSIS — G43019 Migraine without aura, intractable, without status migrainosus: Secondary | ICD-10-CM

## 2021-10-31 NOTE — Progress Notes (Signed)
Botox- 100 units x 2 vials ?Lot: [id number] ?Expiration: 10/2023 ?NDC: [phone] ? ?Bacteriostatic 0.9% Sodium Chloride- 70mL total ?Lot: [id number] ?Expiration: 04/02/2023 ?NDC: 6213-0865-78 ? ?Dx: G43.019 ?S/P  ?

## 2021-11-20 ENCOUNTER — Encounter: Payer: Self-pay | Admitting: Family Medicine

## 2021-11-20 NOTE — Telephone Encounter (Signed)
LVM for pt offering work in infusion tomorrow between 10-12p per infusion suite. Asked her to either call back before 5pm today or send mychart to let us know if this works and what time between 10-12p would work for her so we can let infusion suite know.  ?

## 2021-11-25 NOTE — Progress Notes (Signed)
? ? ?Ronal FearLam, Lynn E, NP ? ? ?Chief Complaint  ?Patient presents with  ? IUD check  ?  Partner can feel strings  ? ? ?HPI: ?     [name] is a 41 y.o. G0P0000 whose LMP was No LMP recorded. (Menstrual status: IUD)., presents today for IUD check and string cutting. Hasn't been sexually active since Mirena placed 07/08/18, but new partner feels the strings (no pain). Pt had placed for endometriosis and doing well. Has monthly light spotting for 2-3 days, occas mild dysmen, no meds needed.  ?Neg pap/neg HPV DNA 05/29/21 ?Neg mammo 11/22 ? ?Patient Active Problem List  ? Diagnosis Date Noted  ? Endometriosis 11/26/2021  ? Allergic rhinitis 08/14/2021  ? Allergic rhinitis due to animal (cat) (dog) hair and dander 08/14/2021  ? Allergic rhinitis due to pollen 08/14/2021  ? Idiopathic urticaria 08/14/2021  ? Mild persistent asthma, uncomplicated 08/14/2021  ? Personal history of urinary calculi 12/15/2020  ? Lateral epicondylitis of right elbow 06/18/2020  ? Patellar instability 05/30/2019  ? Common migraine with intractable migraine 09/23/2016  ? Low back pain 08/12/2016  ? Shoulder pain 08/12/2016  ? Hemorrhoids, external without complications 05/21/2011  ? ? ?Past Surgical History:  ?Procedure Laterality Date  ? CHONDROPLASTY Right 11/10/2014  ? Procedure: CHONDROPLASTY;  Surgeon: Marcene CorningPeter Dalldorf, MD;  Location: [facility];  Service: Orthopedics;  Laterality: Right;  ? DIAGNOSTIC LAPAROSCOPY    ? Prior to 2010 x 2  ? KNEE ARTHROSCOPY W/ LATERAL RELEASE Right 07/20/2008  ? KNEE ARTHROSCOPY WITH FULKERSON SLIDE Right 11/10/2014  ? Procedure: RIGHT KNEE ARTHROSCOPY WITH Conan BowensFULKERSON SLIDE;  Surgeon: Marcene CorningPeter Dalldorf, MD;  Location: [facility];  Service: Orthopedics;  Laterality: Right;  ? KNEE ARTHROSCOPY WITH LATERAL RELEASE Right 11/10/2014  ? Procedure: KNEE ARTHROSCOPY WITH LATERAL RELEASE;  Surgeon: Marcene CorningPeter Dalldorf, MD;  Location: [facility];  Service: Orthopedics;   Laterality: Right;  ? SHOULDER ARTHROSCOPY  2020  ? Left   ? TONSILLECTOMY N/A 07/31/2015  ? Procedure: TONSILLECTOMY;  Surgeon: Geanie LoganPaul Bennett, MD;  [facility];  Service: ENT;  Laterality: N/A;  ? UPPER GI ENDOSCOPY    ? ? ?Family History  ?Problem Relation Age of Onset  ? Anesthesia problems Mother   ?     woke up during surgery  ? Parkinson's disease Paternal Grandfather   ? Migraines Neg Hx   ? ? ?Social History  ? ?Socioeconomic History  ? Marital status: Single  ?  Spouse name: Not on file  ? Number of children: 0  ? Years of education: Master's  ? Highest education level: Not on file  ?Occupational History  ? Occupation: [employer]  ?Tobacco Use  ? Smoking status: Former  ?  Packs/day: 0.50  ?  Years: 4.00  ?  Pack years: 2.00  ?  Types: Cigarettes  ?  Quit date: 01/11/2015  ?  Years since quitting: 6.8  ? Smokeless tobacco: Never  ? Tobacco comments:  ?     ?Vaping Use  ? Vaping Use: Never used  ?Substance and Sexual Activity  ? Alcohol use: Yes  ?  Alcohol/week: 1.0 - 2.0 standard drink  ?  Types: 1 - 2 Standard drinks or equivalent per week  ?  Comment: occasionally  ? Drug use: No  ? Sexual activity: Yes  ?  Birth control/protection: I.U.D.  ?  Comment: Mirena  ?Other Topics Concern  ? Not on file  ?Social History Narrative  ?  Lives at home w/ her brother  ? Right-handed  ? Caffeine: 0-4 cups coffee per day  ? ?Social Determinants of Health  ? ?Financial Resource Strain: Not on file  ?Food Insecurity: Not on file  ?Transportation Needs: Not on file  ?Physical Activity: Not on file  ?Stress: Not on file  ?Social Connections: Not on file  ?Intimate Partner Violence: Not on file  ? ? ?Outpatient Medications Prior to Visit  ?Medication Sig Dispense Refill  ? albuterol (VENTOLIN HFA) 108 (90 Base) MCG/ACT inhaler Inhale into the lungs.    ? AMZEEQ 4 % FOAM     ? botulinum toxin Type A (BOTOX) 100 units SOLR injection INJECT 155 UNITS  INTRAMUSCULARLY EVERY 12  WEEKS (GIVEN AT MD OFFICE,  DISCARD  UNUSED) 2 each 1  ? budesonide-formoterol (SYMBICORT) 160-4.5 MCG/ACT inhaler Inhale 2 puffs into the lungs 2 (two) times daily.    ? cetirizine (ZYRTEC) 10 MG tablet Take 10 mg by mouth daily.    ? dexlansoprazole (DEXILANT) 60 MG capsule Take 1 capsule by mouth  daily as needed    ? EPINEPHrine 0.3 mg/0.3 mL IJ SOAJ injection See admin instructions.    ? Fremanezumab-vfrm (AJOVY) 225 MG/1.5ML SOAJ Inject 225 mg into the skin every 30 (thirty) days. 4.5 mL 3  ? GNP 24 HOUR NASAL ALLERGY 55 MCG/ACT AERO nasal inhaler SMARTSIG:Both Nares    ? levonorgestrel (MIRENA) 20 MCG/24HR IUD 1 each by Intrauterine route once.    ? lisdexamfetamine (VYVANSE) 70 MG capsule Take 60 mg by mouth every morning.    ? NURTEC 75 MG TBDP Take 1 tablet by mouth daily as needed. 30 tablet 3  ? ondansetron (ZOFRAN) 4 MG tablet Take 1 tablet (4 mg total) by mouth every 8 (eight) hours as needed for nausea or vomiting. 20 tablet 0  ? Spacer/Aero-Holding Chambers (AEROCHAMBER MV) inhaler See admin instructions.    ? spironolactone (ALDACTONE) 50 MG tablet Take 50 mg by mouth 2 (two) times daily.    ? ?No facility-administered medications prior to visit.  ? ? ? ? ?ROS: ? ?Review of Systems  ?Constitutional:  Negative for fever.  ?Gastrointestinal:  Negative for blood in stool, constipation, diarrhea, nausea and vomiting.  ?Genitourinary:  Negative for dyspareunia, dysuria, flank pain, frequency, hematuria, urgency, vaginal bleeding, vaginal discharge and vaginal pain.  ?Musculoskeletal:  Negative for back pain.  ?Skin:  Negative for rash.  ?BREAST: No symptoms ? ? ?OBJECTIVE:  ? ?Vitals:  ?BP 100/64   Ht 5\' 4"  (1.626 m)   Wt 164 lb (74.4 kg)   BMI 28.15 kg/m?  ? ?Physical Exam ?Vitals reviewed.  ?Constitutional:   ?   Appearance: She is well-developed.  ?Pulmonary:  ?   Effort: Pulmonary effort is normal.  ?Genitourinary: ?   General: Normal vulva.  ?   Pubic Area: No rash.   ?   Labia:     ?   Right: No rash, tenderness or lesion.     ?    Left: No rash, tenderness or lesion.   ?   Vagina: Normal. No vaginal discharge, erythema or tenderness.  ?   Cervix: Normal.  ?   Uterus: Normal. Not enlarged and not tender.   ?   Adnexa: Right adnexa normal and left adnexa normal.    ?   Right: No mass or tenderness.      ?   Left: No mass or tenderness.    ?   Comments: IUD STRINGS IN CX  OS.  ?Musculoskeletal:     ?   General: Normal range of motion.  ?   Cervical back: Normal range of motion.  ?Skin: ?   General: Skin is warm and dry.  ?Neurological:  ?   General: No focal deficit present.  ?   Mental Status: She is alert and oriented to person, place, and time.  ?Psychiatric:     ?   Mood and Affect: Mood normal.     ?   Behavior: Behavior normal.     ?   Thought Content: Thought content normal.     ?   Judgment: Judgment normal.  ? ? ?Assessment/Plan: ?Encounter for routine checking of intrauterine contraceptive device (IUD)--pt doing well. ~1 cm IUD string trimmed. F/u prn.  ? ? ? Return if symptoms worsen or fail to improve. ? ?Daiel Strohecker B. Fatime Biswell, PA-C ?11/26/2021 ?9:30 AM ? ? ? ? ? ?

## 2021-11-26 ENCOUNTER — Encounter: Payer: Self-pay | Admitting: Obstetrics and Gynecology

## 2021-11-26 ENCOUNTER — Ambulatory Visit: Payer: Managed Care, Other (non HMO) | Admitting: Obstetrics and Gynecology

## 2021-11-26 ENCOUNTER — Other Ambulatory Visit: Payer: Self-pay

## 2021-11-26 VITALS — BP 100/64 | Ht 64.0 in | Wt 164.0 lb

## 2021-11-26 DIAGNOSIS — N809 Endometriosis, unspecified: Secondary | ICD-10-CM | POA: Diagnosis not present

## 2021-11-26 DIAGNOSIS — Z30431 Encounter for routine checking of intrauterine contraceptive device: Secondary | ICD-10-CM

## 2021-12-12 ENCOUNTER — Other Ambulatory Visit: Payer: Self-pay | Admitting: *Deleted

## 2021-12-12 DIAGNOSIS — Z87442 Personal history of urinary calculi: Secondary | ICD-10-CM

## 2021-12-13 ENCOUNTER — Ambulatory Visit
Admission: RE | Admit: 2021-12-13 | Discharge: 2021-12-13 | Disposition: A | Discharge status: Home / Self Care | Payer: Managed Care, Other (non HMO) | Attending: Urology | Admitting: Urology

## 2021-12-13 ENCOUNTER — Ambulatory Visit
Admission: RE | Admit: 2021-12-13 | Discharge: 2021-12-13 | Disposition: A | Discharge status: Home / Self Care | Payer: Managed Care, Other (non HMO) | Source: Ambulatory Visit | Attending: Urology | Admitting: Urology

## 2021-12-13 ENCOUNTER — Encounter: Payer: Self-pay | Admitting: Urology

## 2021-12-13 ENCOUNTER — Ambulatory Visit: Payer: Managed Care, Other (non HMO) | Admitting: Urology

## 2021-12-13 VITALS — BP 120/79 | HR 86 | Ht 64.0 in | Wt 160.0 lb

## 2021-12-13 DIAGNOSIS — Z87442 Personal history of urinary calculi: Secondary | ICD-10-CM

## 2021-12-13 DIAGNOSIS — Z0389 Encounter for observation for other suspected diseases and conditions ruled out: Secondary | ICD-10-CM | POA: Diagnosis not present

## 2021-12-13 LAB — URINALYSIS, COMPLETE
Bilirubin, UA: NEGATIVE
Glucose, UA: NEGATIVE
Ketones, UA: NEGATIVE
Leukocytes,UA: NEGATIVE
Nitrite, UA: NEGATIVE
Protein,UA: NEGATIVE
RBC, UA: NEGATIVE
Specific Gravity, UA: >1.03 — ABNORMAL HIGH (ref 1.005–1.030)
Urobilinogen, Ur: 0.2 mg/dL (ref 0.2–1.0)
pH, UA: 5.5 (ref 5.0–7.5)

## 2021-12-13 LAB — MICROSCOPIC EXAMINATION

## 2021-12-13 NOTE — Progress Notes (Signed)
? ?12/13/2021 ?9:20 AM  ? ?Janet Gallegos ?Jan 29, 1981 ?[id number] ? ?Referring provider: Ronal Fear, NP ?[address] [place] ?[place],  [place] [address] ? ?Chief Complaint  ?Patient presents with  ? Nephrolithiasis  ? ? ?Urologic history: ?1.  Personal history urinary calculi ?Left UVJ calculus 10/2020 which she passed ?CaOxMono/CaOxDi 10/90 ? ?HPI: ?41 y.o. female presents for annual follow-up visit. ? ?Within the past month she had an episode of mild left side pain which developed into severe suprapubic pain.  The symptoms resolved after voiding and she has been asymptomatic since that time ?Has been busy with work and has not been able to stay as hydrated as she would like ? ? ?PMH: ?Past Medical History:  ?Diagnosis Date  ? Acid reflux   ? ADHD   ? Asthma   ? prn inhaler  ? 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 Common migraine with intractable migraine 09/23/2016  ? Complication of anesthesia   ? states is hard to wake up post-op  ? Dental bridge present upper  ? Dental crown present   ? lower  ? Dumping syndrome   ? Endometriosis   ? Family history of adverse reaction to anesthesia   ? states pt's mother woke up during a surgery  ? GERD (gastroesophageal reflux disease)   ? no current med.  ? Kidney stone   ? Migraines   ? ? ?Surgical History: ?Past Surgical History:  ?Procedure Laterality Date  ? CHONDROPLASTY Right 11/10/2014  ? Procedure: CHONDROPLASTY;  Surgeon: Marcene Corning, MD;  Location: [facility];  Service: Orthopedics;  Laterality: Right;  ? DIAGNOSTIC LAPAROSCOPY    ? Prior to 2010 x 2  ? KNEE ARTHROSCOPY W/ LATERAL RELEASE Right 07/20/2008  ? KNEE ARTHROSCOPY WITH FULKERSON SLIDE Right 11/10/2014  ? Procedure: RIGHT KNEE ARTHROSCOPY WITH Conan Bowens;  Surgeon: Marcene Corning, MD;  Location: [facility];  Service: Orthopedics;  Laterality: Right;  ? KNEE ARTHROSCOPY WITH LATERAL RELEASE Right 11/10/2014  ? Procedure: KNEE ARTHROSCOPY WITH LATERAL RELEASE;  Surgeon:  Marcene Corning, MD;  Location:  [facility];  Service: Orthopedics;  Laterality: Right;  ? SHOULDER ARTHROSCOPY  2020  ? Left   ? TONSILLECTOMY N/A 07/31/2015  ? Procedure: TONSILLECTOMY;  Surgeon: Geanie Logan, MD;  Location: [facility];  Service: ENT;  Laterality: N/A;  ? UPPER GI ENDOSCOPY    ? ? ?Home Medications:  ?Allergies as of 12/13/2021   ? ?   Reactions  ? Sulfa Antibiotics Nausea And Vomiting  ? Percocet [oxycodone-acetaminophen] Nausea And Vomiting  ? Propranolol   ? Rash  ? Topamax [topiramate]   ? Nausea  ? ?  ? ?  ?Medication List  ?  ? ?  ? Accurate as of December 13, 2021  9:20 AM. If you have any questions, ask your nurse or doctor.  ?  ?  ? ?  ? ?AeroChamber MV inhaler ?See admin instructions. ?  ?Ajovy 225 MG/1.5ML Soaj ?Generic drug: Fremanezumab-vfrm ?Inject 225 mg into the skin every 30 (thirty) days. ?  ?albuterol 108 (90 Base) MCG/ACT inhaler ?Commonly known as: VENTOLIN HFA ?Inhale into the lungs. ?  ?Amzeeq 4 % Foam ?Generic drug: Minocycline HCl Micronized ?  ?Botox 100 units Solr injection ?Generic drug: botulinum toxin Type A ?INJECT 155 UNITS  INTRAMUSCULARLY EVERY 12  WEEKS (GIVEN AT MD OFFICE,  DISCARD UNUSED) ?  ?budesonide-formoterol 160-4.5 MCG/ACT inhaler ?Commonly known as: SYMBICORT ?Inhale 2 puffs into the lungs 2 (  two) times daily. ?  ?cetirizine 10 MG tablet ?Commonly known as: ZYRTEC ?Take 10 mg by mouth daily. ?  ?dexlansoprazole 60 MG capsule ?Commonly known as: DEXILANT ?Take 1 capsule by mouth  daily as needed ?  ?EPINEPHrine 0.3 mg/0.3 mL Soaj injection ?Commonly known as: EPI-PEN ?See admin instructions. ?  ?GNP 24 Hour Nasal Allergy 55 MCG/ACT Aero nasal inhaler ?Generic drug: triamcinolone ?SMARTSIG:Both Nares ?  ?levonorgestrel 20 MCG/24HR IUD ?Commonly known as: MIRENA ?1 each by Intrauterine route once. ?  ?lisdexamfetamine 70 MG capsule ?Commonly known as: VYVANSE ?Take 60 mg by mouth every morning. ?  ?Nurtec 75 MG Tbdp ?Generic drug:  Rimegepant Sulfate ?Take 1 tablet by mouth daily as needed. ?  ?ondansetron 4 MG tablet ?Commonly known as: Zofran ?Take 1 tablet (4 mg total) by mouth every 8 (eight) hours as needed for nausea or vomiting. ?  ?spironolactone 50 MG tablet ?Commonly known as: ALDACTONE ?Take 50 mg by mouth 2 (two) times daily. ?  ? ?  ? ? ?Allergies:  ?Allergies  ?Allergen Reactions  ? Sulfa Antibiotics Nausea And Vomiting  ? Percocet [Oxycodone-Acetaminophen] Nausea And Vomiting  ? Propranolol   ?  Rash  ? Topamax [Topiramate]   ?  Nausea  ? ? ?Family History: ?Family History  ?Problem Relation Age of Onset  ? Anesthesia problems Mother   ?     woke up during surgery  ? Parkinson's disease Paternal Grandfather   ? Migraines Neg Hx   ? ? ?Social History:  reports that she quit smoking about 6 years ago. Her smoking use included cigarettes. She has a 2.00 pack-year smoking history. She has never used smokeless tobacco. She reports current alcohol use of about 1.0 - 2.0 standard drink per week. She reports that she does not use drugs. ? ? ?Physical Exam: ?BP 120/79 (BP Location: Left Arm, Patient Position: Sitting, Cuff Size: Normal)   Pulse 86   Ht 5\' 4"  (1.626 m)   Wt 160 lb (72.6 kg)   BMI 27.46 kg/m?   ?Constitutional:  Alert and oriented, No acute distress. ? ? ?Assessment & Plan:   ?UA and KUB ordered-Will call with results ?If no abnormalities noted follow-up 1 year with KUB ? ? ? , MD ? ?[facility] ?[address], [address] ?[place], [place] ?[phone] ? ?

## 2021-12-16 ENCOUNTER — Encounter: Payer: Self-pay | Admitting: *Deleted

## 2021-12-18 ENCOUNTER — Telehealth: Payer: Self-pay | Admitting: Family Medicine

## 2021-12-18 NOTE — Telephone Encounter (Signed)
Received (2) 100 unit vials of Botox from [facility]. ?

## 2022-01-22 NOTE — Progress Notes (Unsigned)
01/23/2022 ALL: She returns for Botox. She continues Ajovy and Nurtec. She reports doing very well. She has only had one migraine since last visit.   10/31/2021 ALL: She returns for Botox. Ajovy seems to be helping. Migraines are less frequent and less severe. Nurtec helps with abortive therapy. She is feeling well, today   08/06/2021 ALL: She continues Botox and Nurtec. We started Ajovy about 2 weeks ago after worsening and prolonged migraines. She has not picked up from pharmacy yet. She was encouraged to take Nurtec with Tylenol, ibuprofen and Benadryl. She is doing a little better but continues to have regular headache days, especially with storms.   05/02/2021 ALL: She continues Botox and Nurtec. She is doing well. She did have an intractable migraine about 2 weeks ago. Decadron on back order and medrol dose pack was given. It did help.   01/30/2021 ALL: She continues to do well with Botox therapy. Nurtec works well for abortive therapy. She will occasionally use rizatriptan if Nurtec not effective. She has had 1 migraine this month after being out of Vyvanse for a few days.   10/25/2020 ALL: She is doing well. Botox continues to be effective in migraine management. Nurtec helps with abortive therapy.   07/24/2020 ALL: She is doing well. Migraines are well managed. She has received Nurtec and it is working well. She was recently started on Vyvanse for ADHD. No worsening in migraines thus far.   Consent Form Botulism Toxin Injection For Chronic Migraine    Reviewed orally with patient, additionally signature is on file:  Botulism toxin has been approved by the Federal drug administration for treatment of chronic migraine. Botulism toxin does not cure chronic migraine and it may not be effective in some patients.  The administration of botulism toxin is accomplished by injecting a small amount of toxin into the muscles of the neck and head. Dosage must be titrated for each individual. Any  benefits resulting from botulism toxin tend to wear off after 3 months with a repeat injection required if benefit is to be maintained. Injections are usually done every 3-4 months with maximum effect peak achieved by about 2 or 3 weeks. Botulism toxin is expensive and you should be sure of what costs you will incur resulting from the injection.  The side effects of botulism toxin use for chronic migraine may include:   -Transient, and usually mild, facial weakness with facial injections  -Transient, and usually mild, head or neck weakness with head/neck injections  -Reduction or loss of forehead facial animation due to forehead muscle weakness  -Eyelid drooping  -Dry eye  -Pain at the site of injection or bruising at the site of injection  -Double vision  -Potential unknown long term risks   Contraindications: You should not have Botox if you are pregnant, nursing, allergic to albumin, have an infection, skin condition, or muscle weakness at the site of the injection, or have myasthenia gravis, Lambert-Eaton syndrome, or ALS.  It is also possible that as with any injection, there may be an allergic reaction or no effect from the medication. Reduced effectiveness after repeated injections is sometimes seen and rarely infection at the injection site may occur. All care will be taken to prevent these side effects. If therapy is given over a long time, atrophy and wasting in the muscle injected may occur. Occasionally the patient's become refractory to treatment because they develop antibodies to the toxin. In this event, therapy needs to be modified.  I have  read the above information and consent to the administration of botulism toxin.   BOTOX PROCEDURE NOTE FOR MIGRAINE HEADACHE  Contraindications and precautions discussed with patient(above). Aseptic procedure was observed and patient tolerated procedure. Procedure performed by Shawnie Dapper, FNP-C.   The condition has existed for more than 6  months, and pt does not have a diagnosis of ALS, Myasthenia Gravis or Lambert-Eaton Syndrome.  Risks and benefits of injections discussed and pt agrees to proceed with the procedure.  Written consent obtained  These injections are medically necessary. Pt  receives good benefits from these injections. These injections do not cause sedations or hallucinations which the oral therapies may cause.   Description of procedure:  The patient was placed in a sitting position. The standard protocol was used for Botox as follows, with 5 units of Botox injected at each site:  -Procerus muscle, midline injection  -Corrugator muscle, bilateral injection  -Frontalis muscle, bilateral injection, with 2 sites each side, medial injection was performed in the upper one third of the frontalis muscle, in the region vertical from the medial inferior edge of the superior orbital rim. The lateral injection was again in the upper one third of the forehead vertically above the lateral limbus of the cornea, 1.5 cm lateral to the medial injection site.  -Temporalis muscle injection, 4 sites, bilaterally. The first injection was 3 cm above the tragus of the ear, second injection site was 1.5 cm to 3 cm up from the first injection site in line with the tragus of the ear. The third injection site was 1.5-3 cm forward between the first 2 injection sites. The fourth injection site was 1.5 cm posterior to the second injection site. 5th site laterally in the temporalis  muscleat the level of the outer canthus.  -Occipitalis muscle injection, 3 sites, bilaterally. The first injection was done one half way between the occipital protuberance and the tip of the mastoid process behind the ear. The second injection site was done lateral and superior to the first, 1 fingerbreadth from the first injection. The third injection site was 1 fingerbreadth superiorly and medially from the first injection site.  -Cervical paraspinal muscle  injection, 2 sites, bilaterally. The first injection site was 1 cm from the midline of the cervical spine, 3 cm inferior to the lower border of the occipital protuberance. The second injection site was 1.5 cm superiorly and laterally to the first injection site.  -Trapezius muscle injection was performed at 3 sites, bilaterally. The first injection site was in the upper trapezius muscle halfway between the inflection point of the neck, and the acromion. The second injection site was one half way between the acromion and the first injection site. The third injection was done between the first injection site and the inflection point of the neck.   Will return for repeat injection in 3 months.   A total of 200 units of Botox was prepared, 155 units of Botox was injected as documented above, any Botox not injected was wasted. The patient tolerated the procedure well, there were no complications of the above procedure.

## 2022-01-23 ENCOUNTER — Ambulatory Visit: Payer: Managed Care, Other (non HMO) | Admitting: Family Medicine

## 2022-01-23 VITALS — BP 113/78 | HR 89 | Ht 64.0 in | Wt 167.0 lb

## 2022-01-23 DIAGNOSIS — G43019 Migraine without aura, intractable, without status migrainosus: Secondary | ICD-10-CM | POA: Diagnosis not present

## 2022-01-23 NOTE — Progress Notes (Signed)
Botox- 200 units x 1 vial Lot: [id number] Expiration: 12/2023 NDC: 1027-2536-64  Bacteriostatic 0.9% Sodium Chloride- 65mL total Lot: [id number] Expiration: 04/02/2023 NDC: 2595-6387-56  Dx: G43.019 S/P

## 2022-02-03 DIAGNOSIS — M222X1 Patellofemoral disorders, right knee: Secondary | ICD-10-CM | POA: Insufficient documentation

## 2022-02-18 ENCOUNTER — Encounter: Payer: Self-pay | Admitting: Family Medicine

## 2022-02-18 NOTE — Telephone Encounter (Signed)
PA Ajovy submitted on CMM. Key:  KeySandi Raveling - PA Case ID: [id number]. Waiting on determination from [facility]. (Fyi-Have to submit via [facility] not [employer] on CMM)  Pt ID: [id number]

## 2022-02-19 IMAGING — US US RENAL
1 series · 14 of 25 positions shown · non-contrast
Comparison: Abdominal ultrasound 09/15/2013

CLINICAL DATA: Left-sided renal colic for several days.

EXAM:
RENAL / URINARY TRACT ULTRASOUND COMPLETE

[Series 1: us renal · 14 of 41 slices shown]
[im 1/41]
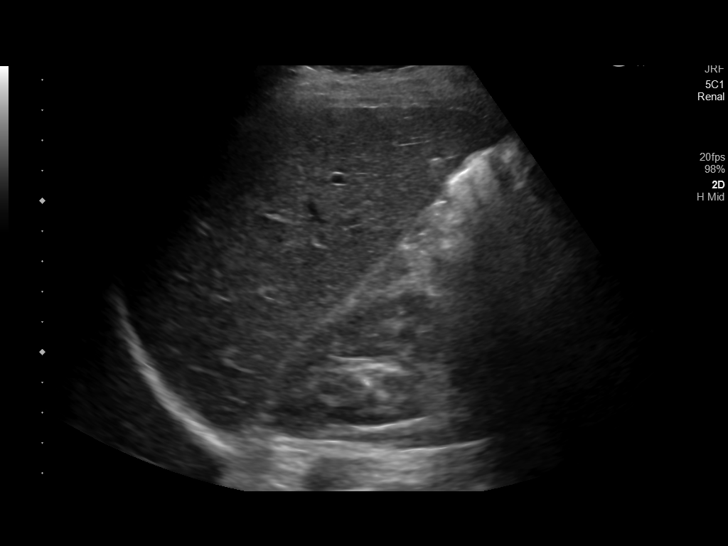
[im 4/41]
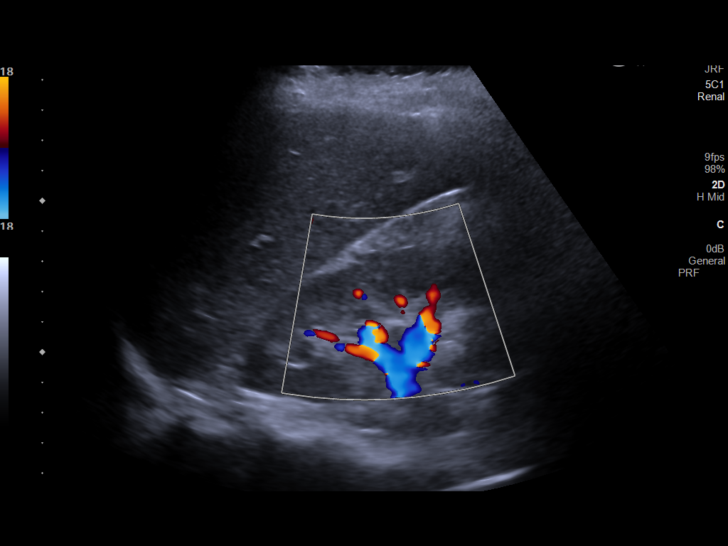
[im 7/41]
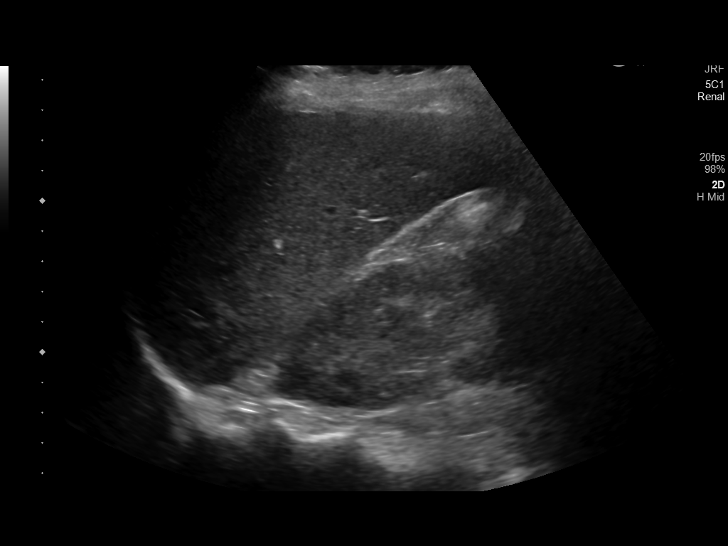
[im 11/41]
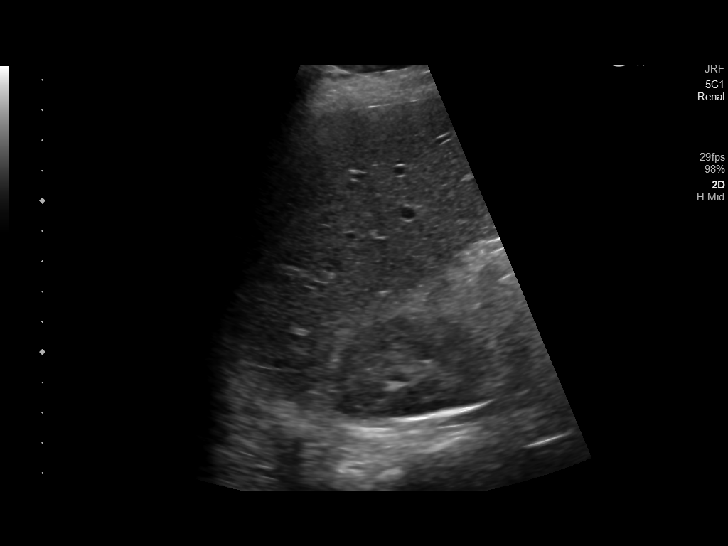
[im 14/41]
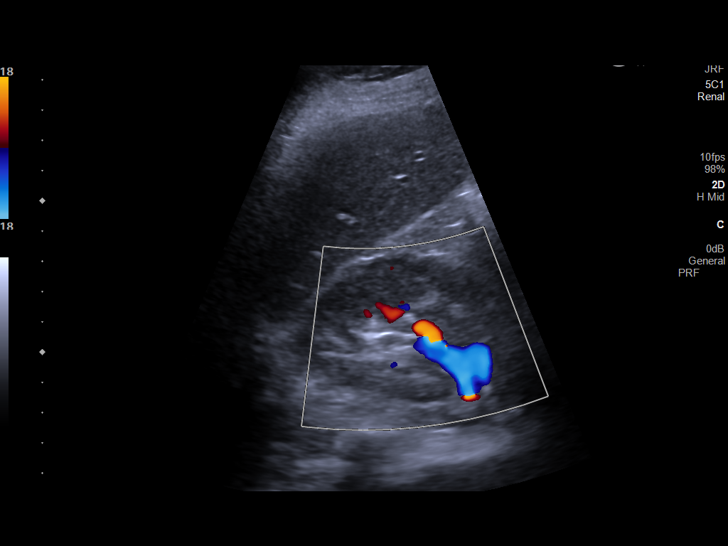
[im 16/41]
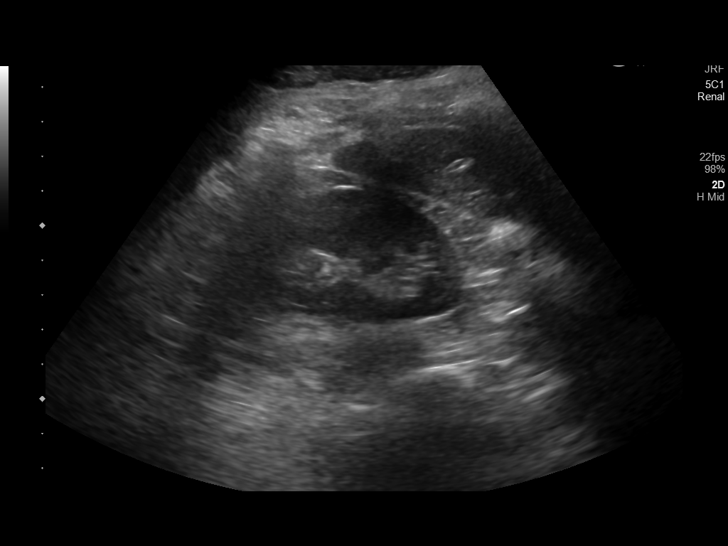
[im 19/41]
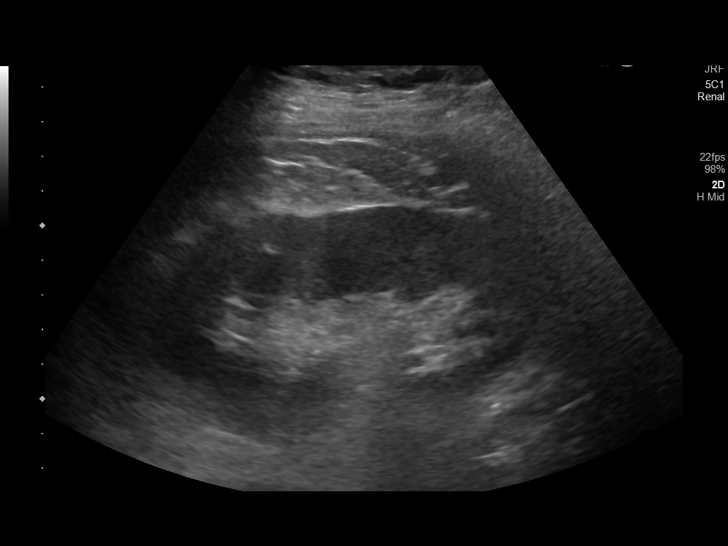
[im 22/41]
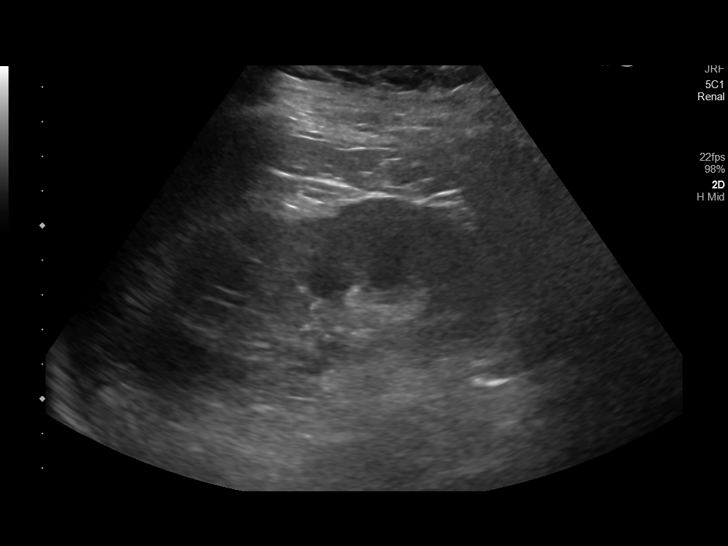
[im 26/41]
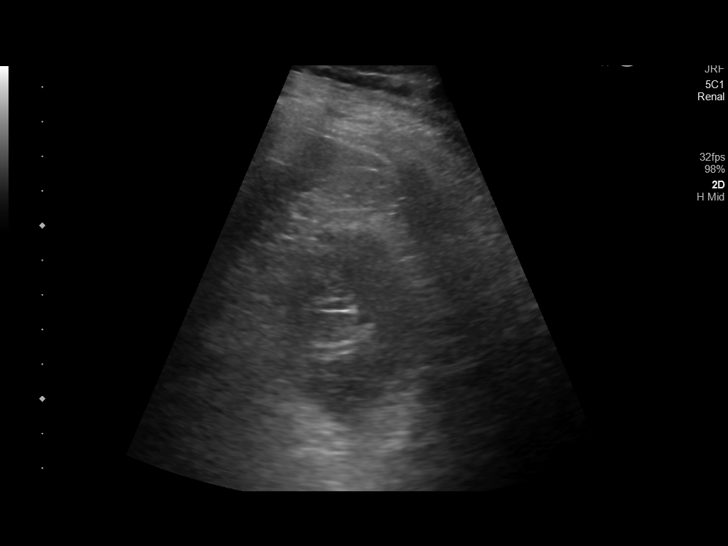
[im 27/41]
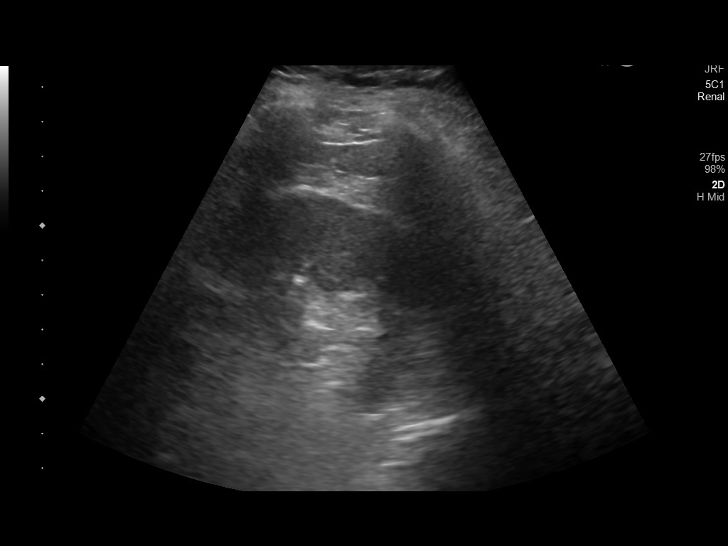
[im 31/41]
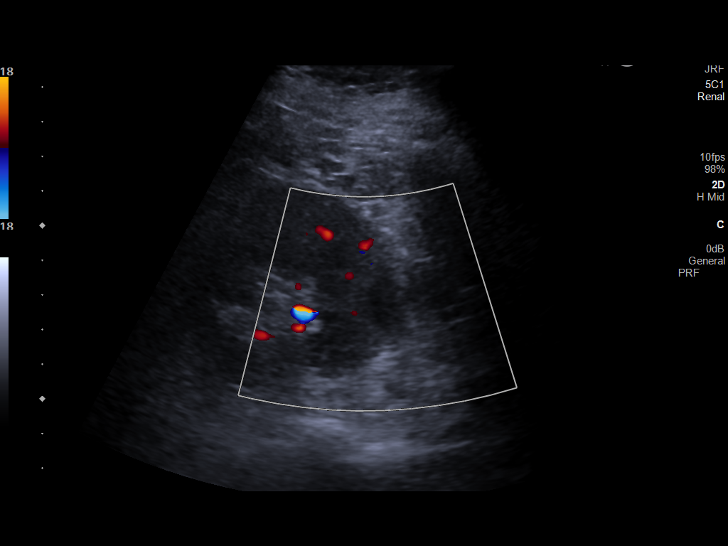
[im 34/41]
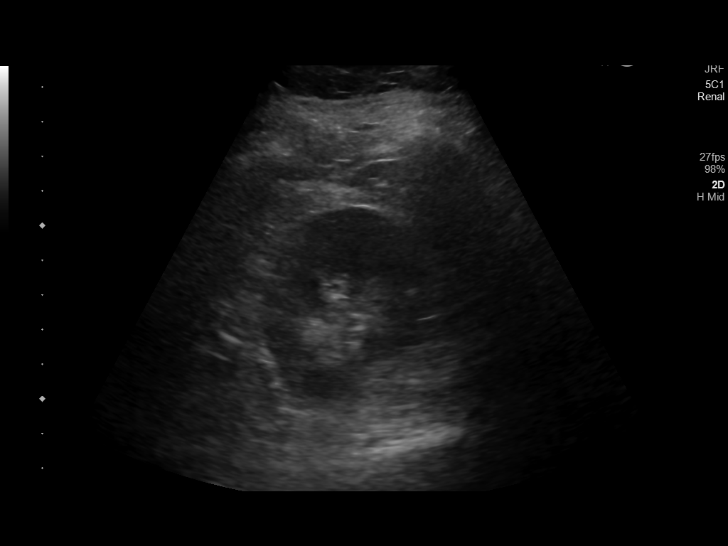
[im 37/41]
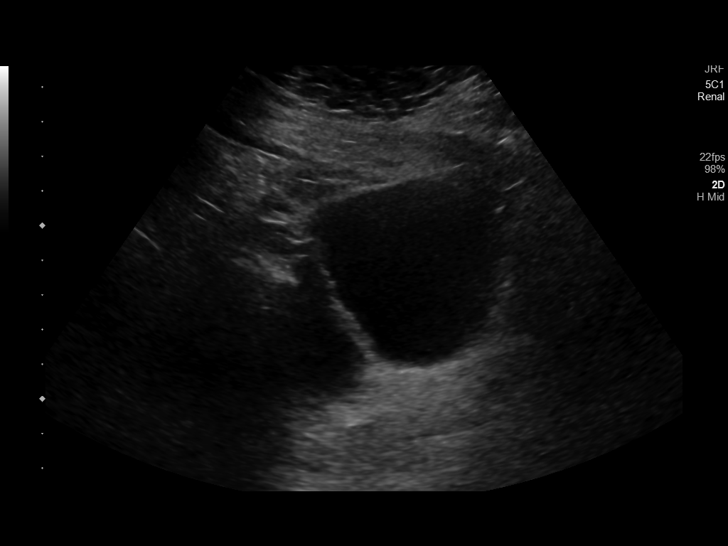
[im 41/41]
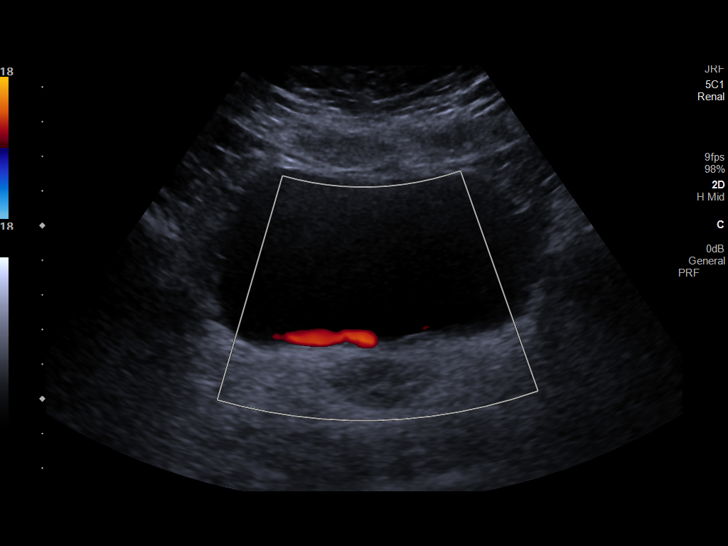

[14 of 25 positions shown; findings below may reference images not displayed]

FINDINGS: Right Kidney:

Renal measurements: 10.3 x 4.8 x 5.4 cm = volume: 138 mL.
Echogenicity within normal limits. No mass or hydronephrosis
visualized. No shadowing renal calculus.

Left Kidney:

Renal measurements: 11.1 x 5.3 x 5.0 cm = volume: 154 mL.
Echogenicity within normal limits. There is a 2.6 cm cyst in the
midpole. No solid mass. No hydronephrosis. No shadowing renal
calculus.

Bladder:

Appears normal for degree of bladder distention.

Other:

None.
IMPRESSION: 1. No sonographic evidence of renal calculi or hydronephrosis
bilaterally.

2.  Benign left renal cyst.

## 2022-02-19 NOTE — Telephone Encounter (Signed)
Faxed printed/signed appeal letter to [phone]. Received fax confirmation, waiting on determination.

## 2022-02-25 ENCOUNTER — Other Ambulatory Visit: Payer: Self-pay | Admitting: Neurology

## 2022-02-25 ENCOUNTER — Encounter: Payer: Self-pay | Admitting: Family Medicine

## 2022-02-25 DIAGNOSIS — G43019 Migraine without aura, intractable, without status migrainosus: Secondary | ICD-10-CM

## 2022-03-05 NOTE — Telephone Encounter (Signed)
Called [facility] to check status of appeal. Spoke w/ Santina Evans. Appeal approved 02/19/22-02/20/23. PA approval #: [id number]. Pt filled rx on 02/27/22.

## 2022-03-06 ENCOUNTER — Telehealth: Payer: Self-pay | Admitting: Family Medicine

## 2022-03-06 NOTE — Telephone Encounter (Signed)
I called patient Janet Gallegos to get Botox appointment rescheduled.

## 2022-03-06 NOTE — Telephone Encounter (Signed)
Please send Botox RX to [facility]. 

## 2022-04-14 ENCOUNTER — Telehealth: Payer: Self-pay

## 2022-04-14 NOTE — Telephone Encounter (Signed)
I called [facility] I spoke with Arienne.  The patient has a $0 co-pay for 200 units of Botox.  I set up delivery for April 16, 2022.  Signature will be required.  Reference number for this order is [id number].

## 2022-04-21 ENCOUNTER — Ambulatory Visit: Payer: Managed Care, Other (non HMO) | Admitting: Family Medicine

## 2022-04-30 NOTE — Progress Notes (Signed)
05/06/2022 ALL: She returns for Botox. She is doing well. She did have a breakthrough migraine in the setting of abrupt discontinuation of Vyvanse and travel to [place]. She is using Nurtec as needed that does help.   01/23/2022 ALL: She returns for Botox. She continues Ajovy and Nurtec. She reports doing very well. She has only had one migraine since last visit.   10/31/2021 ALL: She returns for Botox. Ajovy seems to be helping. Migraines are less frequent and less severe. Nurtec helps with abortive therapy. She is feeling well, today   08/06/2021 ALL: She continues Botox and Nurtec. We started Ajovy about 2 weeks ago after worsening and prolonged migraines. She has not picked up from pharmacy yet. She was encouraged to take Nurtec with Tylenol, ibuprofen and Benadryl. She is doing a little better but continues to have regular headache days, especially with storms.   05/02/2021 ALL: She continues Botox and Nurtec. She is doing well. She did have an intractable migraine about 2 weeks ago. Decadron on back order and medrol dose pack was given. It did help.   01/30/2021 ALL: She continues to do well with Botox therapy. Nurtec works well for abortive therapy. She will occasionally use rizatriptan if Nurtec not effective. She has had 1 migraine this month after being out of Vyvanse for a few days.   10/25/2020 ALL: She is doing well. Botox continues to be effective in migraine management. Nurtec helps with abortive therapy.   07/24/2020 ALL: She is doing well. Migraines are well managed. She has received Nurtec and it is working well. She was recently started on Vyvanse for ADHD. No worsening in migraines thus far.   Consent Form Botulism Toxin Injection For Chronic Migraine    Reviewed orally with patient, additionally signature is on file:  Botulism toxin has been approved by the Federal drug administration for treatment of chronic migraine. Botulism toxin does not cure chronic migraine and it may not  be effective in some patients.  The administration of botulism toxin is accomplished by injecting a small amount of toxin into the muscles of the neck and head. Dosage must be titrated for each individual. Any benefits resulting from botulism toxin tend to wear off after 3 months with a repeat injection required if benefit is to be maintained. Injections are usually done every 3-4 months with maximum effect peak achieved by about 2 or 3 weeks. Botulism toxin is expensive and you should be sure of what costs you will incur resulting from the injection.  The side effects of botulism toxin use for chronic migraine may include:   -Transient, and usually mild, facial weakness with facial injections  -Transient, and usually mild, head or neck weakness with head/neck injections  -Reduction or loss of forehead facial animation due to forehead muscle weakness  -Eyelid drooping  -Dry eye  -Pain at the site of injection or bruising at the site of injection  -Double vision  -Potential unknown long term risks   Contraindications: You should not have Botox if you are pregnant, nursing, allergic to albumin, have an infection, skin condition, or muscle weakness at the site of the injection, or have myasthenia gravis, Lambert-Eaton syndrome, or ALS.  It is also possible that as with any injection, there may be an allergic reaction or no effect from the medication. Reduced effectiveness after repeated injections is sometimes seen and rarely infection at the injection site may occur. All care will be taken to prevent these side effects. If therapy is  given over a long time, atrophy and wasting in the muscle injected may occur. Occasionally the patient's become refractory to treatment because they develop antibodies to the toxin. In this event, therapy needs to be modified.  I have read the above information and consent to the administration of botulism toxin.   BOTOX PROCEDURE NOTE FOR MIGRAINE  HEADACHE  Contraindications and precautions discussed with patient(above). Aseptic procedure was observed and patient tolerated procedure. Procedure performed by Shawnie Dapper, FNP-C.   The condition has existed for more than 6 months, and pt does not have a diagnosis of ALS, Myasthenia Gravis or Lambert-Eaton Syndrome.  Risks and benefits of injections discussed and pt agrees to proceed with the procedure.  Written consent obtained  These injections are medically necessary. Pt  receives good benefits from these injections. These injections do not cause sedations or hallucinations which the oral therapies may cause.   Description of procedure:  The patient was placed in a sitting position. The standard protocol was used for Botox as follows, with 5 units of Botox injected at each site:  -Procerus muscle, midline injection  -Corrugator muscle, bilateral injection  -Frontalis muscle, bilateral injection, with 2 sites each side, medial injection was performed in the upper one third of the frontalis muscle, in the region vertical from the medial inferior edge of the superior orbital rim. The lateral injection was again in the upper one third of the forehead vertically above the lateral limbus of the cornea, 1.5 cm lateral to the medial injection site.  -Temporalis muscle injection, 4 sites, bilaterally. The first injection was 3 cm above the tragus of the ear, second injection site was 1.5 cm to 3 cm up from the first injection site in line with the tragus of the ear. The third injection site was 1.5-3 cm forward between the first 2 injection sites. The fourth injection site was 1.5 cm posterior to the second injection site. 5th site laterally in the temporalis  muscleat the level of the outer canthus.  -Occipitalis muscle injection, 3 sites, bilaterally. The first injection was done one half way between the occipital protuberance and the tip of the mastoid process behind the ear. The second injection  site was done lateral and superior to the first, 1 fingerbreadth from the first injection. The third injection site was 1 fingerbreadth superiorly and medially from the first injection site.  -Cervical paraspinal muscle injection, 2 sites, bilaterally. The first injection site was 1 cm from the midline of the cervical spine, 3 cm inferior to the lower border of the occipital protuberance. The second injection site was 1.5 cm superiorly and laterally to the first injection site.  -Trapezius muscle injection was performed at 3 sites, bilaterally. The first injection site was in the upper trapezius muscle halfway between the inflection point of the neck, and the acromion. The second injection site was one half way between the acromion and the first injection site. The third injection was done between the first injection site and the inflection point of the neck.   Will return for repeat injection in 3 months.   A total of 200 units of Botox was prepared, 155 units of Botox was injected as documented above, any Botox not injected was wasted. The patient tolerated the procedure well, there were no complications of the above procedure.

## 2022-05-06 ENCOUNTER — Other Ambulatory Visit: Payer: Self-pay

## 2022-05-06 ENCOUNTER — Ambulatory Visit (INDEPENDENT_AMBULATORY_CARE_PROVIDER_SITE_OTHER): Payer: Managed Care, Other (non HMO) | Admitting: Family Medicine

## 2022-05-06 DIAGNOSIS — G43019 Migraine without aura, intractable, without status migrainosus: Secondary | ICD-10-CM

## 2022-05-06 MED ORDER — NURTEC 75 MG PO TBDP: 1.0000 | ORAL_TABLET | Freq: Every day | ORAL | 3 refills | Status: DC | PRN

## 2022-05-06 MED ORDER — ONABOTULINUMTOXINA 100 UNITS IJ SOLR
155.0000 [IU] | Freq: Once | INTRAMUSCULAR | Status: AC
  Administered 2022-05-06: 155 [IU] via INTRAMUSCULAR

## 2022-05-06 NOTE — Progress Notes (Signed)
Botox- 100 units x 2 vials Lot: [id number] Expiration: 06/2024 NDC: 4599-7741-42  Bacteriostatic 0.9% Sodium Chloride- 23mL total Lot: [id number] Expiration: 06/05/2022 NDC: 2334-3568-61  Dx: U83.729 S/P [facility]

## 2022-05-18 ENCOUNTER — Encounter: Payer: Self-pay | Admitting: Family Medicine

## 2022-05-19 NOTE — Telephone Encounter (Signed)
Gave below signed order to [facility].

## 2022-06-16 NOTE — Progress Notes (Unsigned)
Ronal Fear, NP (Inactive)   No chief complaint on file.   HPI:      [name] is a 41 y.o. G0P0000 whose LMP was No LMP recorded. (Menstrual status: IUD)., presents today for *** Has Mirena IUD, placed 2019?   Patient Active Problem List   Diagnosis Date Noted   Endometriosis 11/26/2021   Allergic rhinitis 08/14/2021   Allergic rhinitis due to animal (cat) (dog) hair and dander 08/14/2021   Allergic rhinitis due to pollen 08/14/2021   Idiopathic urticaria 08/14/2021   Mild persistent asthma, uncomplicated 08/14/2021   Personal history of urinary calculi 12/15/2020   Lateral epicondylitis of right elbow 06/18/2020   Patellar instability 05/30/2019   Common migraine with intractable migraine 09/23/2016   Low back pain 08/12/2016   Shoulder pain 08/12/2016   Hemorrhoids, external without complications 05/21/2011    Past Surgical History:  Procedure Laterality Date   CHONDROPLASTY Right 11/10/2014   Procedure: CHONDROPLASTY;  Surgeon: Marcene Corning, MD;  Location: [facility];  Service: Orthopedics;  Laterality: Right;   DIAGNOSTIC LAPAROSCOPY     Prior to 2010 x 2   KNEE ARTHROSCOPY W/ LATERAL RELEASE Right 07/20/2008   KNEE ARTHROSCOPY WITH FULKERSON SLIDE Right 11/10/2014   Procedure: RIGHT KNEE ARTHROSCOPY WITH Conan Bowens;  Surgeon: Marcene Corning, MD;  Location: [facility];  Service: Orthopedics;  Laterality: Right;   KNEE ARTHROSCOPY WITH LATERAL RELEASE Right 11/10/2014   Procedure: KNEE ARTHROSCOPY WITH LATERAL RELEASE;  Surgeon: Marcene Corning, MD;  Location: [facility];  Service: Orthopedics;  Laterality: Right;   SHOULDER ARTHROSCOPY  2020   Left    TONSILLECTOMY N/A 07/31/2015   Procedure: TONSILLECTOMY;  Surgeon: Geanie Logan, MD;  Location: [facility];  Service: ENT;  Laterality: N/A;   UPPER GI ENDOSCOPY      Family History  Problem Relation Age of Onset   Anesthesia problems Mother         woke up during surgery   Parkinson's disease Paternal Grandfather    Migraines Neg Hx     Social History   Socioeconomic History   Marital status: Single    Spouse name: Not on file   Number of children: 0   Years of education: Master's   Highest education level: Not on file  Occupational History   Occupation: [employer]  Tobacco Use   Smoking status: Former    Packs/day: 0.50    Years: 4.00    Total pack years: 2.00    Types: Cigarettes    Quit date: 01/11/2015    Years since quitting: 7.4   Smokeless tobacco: Never   Tobacco comments:       Vaping Use   Vaping Use: Never used  Substance and Sexual Activity   Alcohol use: Yes    Alcohol/week: 1.0 - 2.0 standard drink of alcohol    Types: 1 - 2 Standard drinks or equivalent per week    Comment: occasionally   Drug use: No   Sexual activity: Yes    Birth control/protection: I.U.D.    Comment: Mirena  Other Topics Concern   Not on file  Social History Narrative   Lives at home w/ her brother   Right-handed   Caffeine: 0-4 cups coffee per day   Social Determinants of Health   Financial Resource Strain: Not on file  Food Insecurity: Not on file  Transportation Needs: Not on file  Physical Activity: Not on file  Stress:  Not on file  Social Connections: Not on file  Intimate Partner Violence: Not on file    Outpatient Medications Prior to Visit  Medication Sig Dispense Refill   albuterol (VENTOLIN HFA) 108 (90 Base) MCG/ACT inhaler Inhale into the lungs.     AMZEEQ 4 % FOAM      BOTOX 100 units SOLR injection INJECT 155 UNITS INTRAMUSCULARLY EVERY 12 WEEKS (GIVEN AT MD  OFFICE, DISCARD UNUSED) 2 each 1   budesonide-formoterol (SYMBICORT) 160-4.5 MCG/ACT inhaler Inhale 2 puffs into the lungs 2 (two) times daily.     cetirizine (ZYRTEC) 10 MG tablet Take 10 mg by mouth daily.     desvenlafaxine (PRISTIQ) 50 MG 24 hr tablet Take 50 mg by mouth daily.     EPINEPHrine 0.3 mg/0.3 mL IJ SOAJ injection See admin  instructions.     Fremanezumab-vfrm (AJOVY) 225 MG/1.5ML SOAJ Inject 225 mg into the skin every 30 (thirty) days. 4.5 mL 3   GNP 24 HOUR NASAL ALLERGY 55 MCG/ACT AERO nasal inhaler SMARTSIG:Both Nares     levonorgestrel (MIRENA) 20 MCG/24HR IUD 1 each by Intrauterine route once.     lisdexamfetamine (VYVANSE) 70 MG capsule Take 60 mg by mouth every morning.     NURTEC 75 MG TBDP Take 1 tablet by mouth daily as needed. 30 tablet 3   ondansetron (ZOFRAN) 4 MG tablet Take 1 tablet (4 mg total) by mouth every 8 (eight) hours as needed for nausea or vomiting. 20 tablet 0   Spacer/Aero-Holding Chambers (AEROCHAMBER MV) inhaler See admin instructions.     spironolactone (ALDACTONE) 50 MG tablet Take 50 mg by mouth 2 (two) times daily.     No facility-administered medications prior to visit.      ROS:  Review of Systems BREAST: No symptoms   OBJECTIVE:   Vitals:  There were no vitals taken for this visit.  Physical Exam  Results: No results found for this or any previous visit (from the past 24 hour(s)).   Assessment/Plan: No diagnosis found.    No orders of the defined types were placed in this encounter.     No follow-ups on file.  Dinari Stgermaine B. Jep Dyas, PA-C 06/16/2022 9:46 AM

## 2022-06-17 ENCOUNTER — Encounter: Payer: Self-pay | Admitting: Obstetrics and Gynecology

## 2022-06-17 ENCOUNTER — Ambulatory Visit (INDEPENDENT_AMBULATORY_CARE_PROVIDER_SITE_OTHER): Payer: Managed Care, Other (non HMO) | Admitting: Obstetrics and Gynecology

## 2022-06-17 VITALS — BP 100/70 | Ht 64.0 in | Wt 168.0 lb

## 2022-06-17 DIAGNOSIS — T8332XA Displacement of intrauterine contraceptive device, initial encounter: Secondary | ICD-10-CM | POA: Diagnosis not present

## 2022-06-17 DIAGNOSIS — Z30431 Encounter for routine checking of intrauterine contraceptive device: Secondary | ICD-10-CM | POA: Diagnosis not present

## 2022-06-17 DIAGNOSIS — N809 Endometriosis, unspecified: Secondary | ICD-10-CM

## 2022-06-17 DIAGNOSIS — R1032 Left lower quadrant pain: Secondary | ICD-10-CM

## 2022-06-17 NOTE — Patient Instructions (Signed)
I value your feedback and you entrusting us with your care. If you get a [facility] patient survey, I would appreciate you taking the time to let us know about your experience today. Thank you! ? ? ?

## 2022-06-24 ENCOUNTER — Ambulatory Visit: Payer: Managed Care, Other (non HMO)

## 2022-06-24 ENCOUNTER — Telehealth: Payer: Self-pay | Admitting: *Deleted

## 2022-06-24 ENCOUNTER — Telehealth: Payer: Self-pay | Admitting: Obstetrics and Gynecology

## 2022-06-24 DIAGNOSIS — R1032 Left lower quadrant pain: Secondary | ICD-10-CM | POA: Diagnosis not present

## 2022-06-24 DIAGNOSIS — T8332XA Displacement of intrauterine contraceptive device, initial encounter: Secondary | ICD-10-CM

## 2022-06-24 DIAGNOSIS — Z30431 Encounter for routine checking of intrauterine contraceptive device: Secondary | ICD-10-CM

## 2022-06-24 DIAGNOSIS — N809 Endometriosis, unspecified: Secondary | ICD-10-CM

## 2022-06-24 NOTE — Telephone Encounter (Signed)
Pt aware of neg GYN u/s results for LLQ pain/IUD in correct location. Pt with hx of endometriosis, requiring surg mgmt twice. Can't tolerate oral/inj hormones, so went with IUD and did well until recently.  Pt can either follow cycles or f/u with MD for further mgmt. Pt will f/u prn.

## 2022-06-24 NOTE — Telephone Encounter (Signed)
PA Nurtec submitted on CMM. Key: [id number]. Waiting on determination from [facility].

## 2022-06-24 NOTE — Telephone Encounter (Signed)
"  Request Reference Number: [id number]. NURTEC TAB 75MG  ODT is approved through 06/25/2023. Your patient may now fill this prescription and it will be covered."

## 2022-07-02 ENCOUNTER — Telehealth: Payer: Self-pay | Admitting: *Deleted

## 2022-07-02 NOTE — Telephone Encounter (Signed)
Called [facility] at [phone]. Spoke w/ Adonis Huguenin. States it is too soon to set up botox shipment. Should call back on 07/17/2022 to try and schedule shipment.  Ref# [id number].

## 2022-07-07 ENCOUNTER — Telehealth: Payer: Self-pay | Admitting: Family Medicine

## 2022-07-07 NOTE — Telephone Encounter (Signed)
Pharmacy benefit PA approved-   Received notification from [facility] regarding a prior authorization for  Botox 200 . Authorization has been APPROVED from 07/07/22 to 10/07/22.   Authorization # (Key: [id number]) - [id number]

## 2022-07-07 NOTE — Telephone Encounter (Signed)
Needs Botox re-auth. Appt 07/29/2022   Chronic Migraine CPT 64615    Botox J0585 Units:200   G43.-019 Common migraine with intractable migraine

## 2022-07-07 NOTE — Telephone Encounter (Signed)
Medical-Submitted a Prior Authorization request to [facility] for  Botox 200  via Fax. Will update once we receive a response.  Phone# [phone] Fax# [phone]  Pharmacy-Submitted a Prior Authorization request to [facility] for  Botox 200  via CoverMyMeds. Will update once we receive a response.   Key: [id number] - PA Case ID: [id number]  Submitted for Botox One Report

## 2022-07-07 NOTE — Telephone Encounter (Signed)
Pt will need new authorization for botox before appointment on 07/29/22

## 2022-07-17 NOTE — Telephone Encounter (Signed)
Mary L Pt Care Coordinator from [facility] called to schedule Botox delivery on Nov 21st 2023 of 100 units qt 2 for 90 days. Order # [id number]

## 2022-07-17 NOTE — Telephone Encounter (Signed)
Confirmed with Jael that Botox shipment scheduled and should arrive 07/22/22.

## 2022-07-28 NOTE — Progress Notes (Unsigned)
07/29/2022 ALL: Janet Gallegos returns for Botox. She continues Ajovy and Nurtec. She is doing well. Migraines have settled down.   05/06/2022 ALL: She returns for Botox. She is doing well. She did have a breakthrough migraine in the setting of abrupt discontinuation of Vyvanse and travel to [place]. She is using Nurtec as needed that does help.   01/23/2022 ALL: She returns for Botox. She continues Ajovy and Nurtec. She reports doing very well. She has only had one migraine since last visit.   10/31/2021 ALL: She returns for Botox. Ajovy seems to be helping. Migraines are less frequent and less severe. Nurtec helps with abortive therapy. She is feeling well, today   08/06/2021 ALL: She continues Botox and Nurtec. We started Ajovy about 2 weeks ago after worsening and prolonged migraines. She has not picked up from pharmacy yet. She was encouraged to take Nurtec with Tylenol, ibuprofen and Benadryl. She is doing a little better but continues to have regular headache days, especially with storms.   05/02/2021 ALL: She continues Botox and Nurtec. She is doing well. She did have an intractable migraine about 2 weeks ago. Decadron on back order and medrol dose pack was given. It did help.   01/30/2021 ALL: She continues to do well with Botox therapy. Nurtec works well for abortive therapy. She will occasionally use rizatriptan if Nurtec not effective. She has had 1 migraine this month after being out of Vyvanse for a few days.   10/25/2020 ALL: She is doing well. Botox continues to be effective in migraine management. Nurtec helps with abortive therapy.   07/24/2020 ALL: She is doing well. Migraines are well managed. She has received Nurtec and it is working well. She was recently started on Vyvanse for ADHD. No worsening in migraines thus far.   Consent Form Botulism Toxin Injection For Chronic Migraine    Reviewed orally with patient, additionally signature is on file:  Botulism toxin has been approved by  the Federal drug administration for treatment of chronic migraine. Botulism toxin does not cure chronic migraine and it may not be effective in some patients.  The administration of botulism toxin is accomplished by injecting a small amount of toxin into the muscles of the neck and head. Dosage must be titrated for each individual. Any benefits resulting from botulism toxin tend to wear off after 3 months with a repeat injection required if benefit is to be maintained. Injections are usually done every 3-4 months with maximum effect peak achieved by about 2 or 3 weeks. Botulism toxin is expensive and you should be sure of what costs you will incur resulting from the injection.  The side effects of botulism toxin use for chronic migraine may include:   -Transient, and usually mild, facial weakness with facial injections  -Transient, and usually mild, head or neck weakness with head/neck injections  -Reduction or loss of forehead facial animation due to forehead muscle weakness  -Eyelid drooping  -Dry eye  -Pain at the site of injection or bruising at the site of injection  -Double vision  -Potential unknown long term risks   Contraindications: You should not have Botox if you are pregnant, nursing, allergic to albumin, have an infection, skin condition, or muscle weakness at the site of the injection, or have myasthenia gravis, Lambert-Eaton syndrome, or ALS.  It is also possible that as with any injection, there may be an allergic reaction or no effect from the medication. Reduced effectiveness after repeated injections is sometimes seen and  rarely infection at the injection site may occur. All care will be taken to prevent these side effects. If therapy is given over a long time, atrophy and wasting in the muscle injected may occur. Occasionally the patient's become refractory to treatment because they develop antibodies to the toxin. In this event, therapy needs to be modified.  I have read the  above information and consent to the administration of botulism toxin.   BOTOX PROCEDURE NOTE FOR MIGRAINE HEADACHE  Contraindications and precautions discussed with patient(above). Aseptic procedure was observed and patient tolerated procedure. Procedure performed by Shawnie Dapper, FNP-C.   The condition has existed for more than 6 months, and pt does not have a diagnosis of ALS, Myasthenia Gravis or Lambert-Eaton Syndrome.  Risks and benefits of injections discussed and pt agrees to proceed with the procedure.  Written consent obtained  These injections are medically necessary. Pt  receives good benefits from these injections. These injections do not cause sedations or hallucinations which the oral therapies may cause.   Description of procedure:  The patient was placed in a sitting position. The standard protocol was used for Botox as follows, with 5 units of Botox injected at each site:  -Procerus muscle, midline injection  -Corrugator muscle, bilateral injection  -Frontalis muscle, bilateral injection, with 2 sites each side, medial injection was performed in the upper one third of the frontalis muscle, in the region vertical from the medial inferior edge of the superior orbital rim. The lateral injection was again in the upper one third of the forehead vertically above the lateral limbus of the cornea, 1.5 cm lateral to the medial injection site.  -Temporalis muscle injection, 4 sites, bilaterally. The first injection was 3 cm above the tragus of the ear, second injection site was 1.5 cm to 3 cm up from the first injection site in line with the tragus of the ear. The third injection site was 1.5-3 cm forward between the first 2 injection sites. The fourth injection site was 1.5 cm posterior to the second injection site. 5th site laterally in the temporalis  muscleat the level of the outer canthus.  -Occipitalis muscle injection, 3 sites, bilaterally. The first injection was done one half  way between the occipital protuberance and the tip of the mastoid process behind the ear. The second injection site was done lateral and superior to the first, 1 fingerbreadth from the first injection. The third injection site was 1 fingerbreadth superiorly and medially from the first injection site.  -Cervical paraspinal muscle injection, 2 sites, bilaterally. The first injection site was 1 cm from the midline of the cervical spine, 3 cm inferior to the lower border of the occipital protuberance. The second injection site was 1.5 cm superiorly and laterally to the first injection site.  -Trapezius muscle injection was performed at 3 sites, bilaterally. The first injection site was in the upper trapezius muscle halfway between the inflection point of the neck, and the acromion. The second injection site was one half way between the acromion and the first injection site. The third injection was done between the first injection site and the inflection point of the neck.   Will return for repeat injection in 3 months.   A total of 200 units of Botox was prepared, 155 units of Botox was injected as documented above, any Botox not injected was wasted. The patient tolerated the procedure well, there were no complications of the above procedure.

## 2022-07-29 ENCOUNTER — Encounter: Payer: Self-pay | Admitting: Family Medicine

## 2022-07-29 ENCOUNTER — Ambulatory Visit (INDEPENDENT_AMBULATORY_CARE_PROVIDER_SITE_OTHER): Payer: Managed Care, Other (non HMO) | Admitting: Family Medicine

## 2022-07-29 VITALS — BP 122/82 | HR 92 | Ht 68.0 in | Wt 168.0 lb

## 2022-07-29 DIAGNOSIS — G43019 Migraine without aura, intractable, without status migrainosus: Secondary | ICD-10-CM

## 2022-07-29 MED ORDER — ONABOTULINUMTOXINA 100 UNITS IJ SOLR
155.0000 [IU] | Freq: Once | INTRAMUSCULAR | Status: AC
  Administered 2022-07-29: 155 [IU] via INTRAMUSCULAR

## 2022-08-04 ENCOUNTER — Other Ambulatory Visit: Payer: Self-pay

## 2022-08-04 MED ORDER — AJOVY 225 MG/1.5ML ~~LOC~~ SOAJ: 225.0000 mg | SUBCUTANEOUS | 3 refills | Status: DC

## 2022-09-25 ENCOUNTER — Telehealth: Payer: Self-pay | Admitting: Family Medicine

## 2022-09-25 NOTE — Telephone Encounter (Signed)
New auth needed before 10/21/22 Botox appointment, current one on file expired 10/07/22.

## 2022-10-01 ENCOUNTER — Other Ambulatory Visit: Payer: Self-pay | Admitting: *Deleted

## 2022-10-01 DIAGNOSIS — G43019 Migraine without aura, intractable, without status migrainosus: Secondary | ICD-10-CM

## 2022-10-02 ENCOUNTER — Other Ambulatory Visit: Payer: Self-pay | Admitting: *Deleted

## 2022-10-02 DIAGNOSIS — G43019 Migraine without aura, intractable, without status migrainosus: Secondary | ICD-10-CM

## 2022-10-02 MED ORDER — BOTOX 100 UNITS IJ SOLR: INTRAMUSCULAR | 1 refills | Status: DC

## 2022-10-02 NOTE — Telephone Encounter (Signed)
Needs Botox re-auth   Chronic Migraine CPT 64615    Botox J0585 Units:200   G43.019 Common migraine with intractable migraine

## 2022-10-06 NOTE — Progress Notes (Signed)
Faxed rx [facility] at [phone]. Received fax confirmation.

## 2022-10-08 NOTE — Telephone Encounter (Signed)
Checking for update, pt scheduled for 10/21/22

## 2022-10-13 ENCOUNTER — Other Ambulatory Visit (HOSPITAL_COMMUNITY): Payer: Self-pay

## 2022-10-13 NOTE — Telephone Encounter (Signed)
Janet Gallegos, can you help verify her insurance per auth team request? Thank you

## 2022-10-13 NOTE — Telephone Encounter (Signed)
There is a Garment/textile technologist on file that is verifying for [place], member ID [id number]. There is also a photo of card under documents.

## 2022-10-13 NOTE — Telephone Encounter (Addendum)
Janet Gallegos is calling. Stated botox 200 will be delivered on 2/14 with signature required.

## 2022-10-14 ENCOUNTER — Other Ambulatory Visit (HOSPITAL_COMMUNITY): Payer: Self-pay

## 2022-10-14 ENCOUNTER — Other Ambulatory Visit: Payer: Self-pay | Admitting: Obstetrics and Gynecology

## 2022-10-14 DIAGNOSIS — Z1231 Encounter for screening mammogram for malignant neoplasm of breast: Secondary | ICD-10-CM

## 2022-10-14 NOTE — Telephone Encounter (Signed)
Pharmacy Patient Advocate Encounter   Received notification from [facility] that prior authorization for Botox 200UNIT solution is required/requested.    PA submitted on 10/14/2021 to (ins) [facility] via CoverMyMeds Key [id number]  Status is pending

## 2022-10-14 NOTE — Telephone Encounter (Signed)
Benefit Verification [id number] Submitted!

## 2022-10-15 NOTE — Telephone Encounter (Signed)
Submitted PA Form for Botox to [employer] along with chart notes-will update once insurance responds.

## 2022-10-16 ENCOUNTER — Telehealth: Payer: Self-pay | Admitting: Family Medicine

## 2022-10-16 NOTE — Telephone Encounter (Signed)
I see you have been working on her Botox authorization. Can you follow up on this? Thank you

## 2022-10-16 NOTE — Telephone Encounter (Signed)
Mickel Baas is calling from [place]. Stated she needs to talk to nurse because the PA for Botox has conflicting information.

## 2022-10-20 ENCOUNTER — Other Ambulatory Visit (HOSPITAL_COMMUNITY): Payer: Self-pay
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
## 2022-10-20 NOTE — Telephone Encounter (Signed)
Has this been approved? Pt is scheduled tomorrow and we've already received her medication.

## 2022-10-20 NOTE — Telephone Encounter (Signed)
Per Angie in billing, this visit should be SP since we already received medication for pt from [facility]. Will keep as SP and use Botox that we received.

## 2022-10-20 NOTE — Telephone Encounter (Signed)
Pharmacy Patient Advocate Encounter  Prior Authorization for Botox 200 units has been approved.    PA# Request ID#: [id number] Effective dates: 10/15/2022 through 10/14/2023 This will be [employer]

## 2022-10-21 ENCOUNTER — Ambulatory Visit: Payer: Managed Care, Other (non HMO) | Admitting: Family Medicine

## 2022-10-21 DIAGNOSIS — G43019 Migraine without aura, intractable, without status migrainosus: Secondary | ICD-10-CM

## 2022-10-21 MED ORDER — ONABOTULINUMTOXINA 100 UNITS IJ SOLR
155.0000 [IU] | Freq: Once | INTRAMUSCULAR | Status: AC
  Administered 2022-10-21: 155 [IU] via INTRAMUSCULAR

## 2022-10-21 NOTE — Progress Notes (Signed)
Botox- 100 units x 2 vial Lot: [id number] Expiration: 10/2024 NDC: [id number]  Bacteriostatic 0.9% Sodium Chloride- 77m total Lot: [id number]: 11/25 NDC: [id number] Dx: [id number]

## 2022-10-21 NOTE — Telephone Encounter (Signed)
Note from [place]: "Per Angie in billing, this visit should be SP since we already received medication for pt from [place]. Will keep as SP and use Botox that we received.  "

## 2022-10-28 IMAGING — MG MM DIGITAL SCREENING BILAT W/ TOMO AND CAD
6 of 10 series · 6 of 30 positions shown · non-contrast
Comparison: None.

CLINICAL DATA: Screening.

EXAM:
DIGITAL SCREENING BILATERAL MAMMOGRAM WITH TOMOSYNTHESIS AND CAD
TECHNIQUE: Bilateral screening digital craniocaudal and mediolateral oblique
mammograms were obtained. Bilateral screening digital breast
tomosynthesis was performed. The images were evaluated with
computer-aided detection.

[L CC synth-2D]
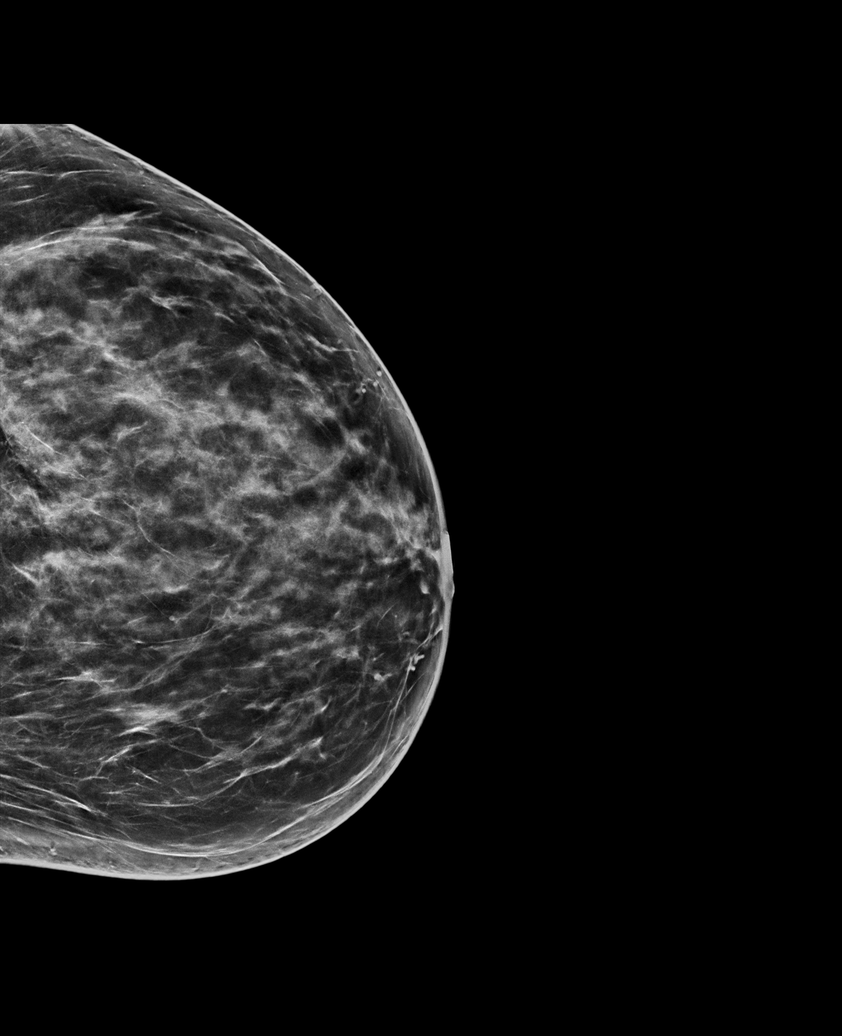

[R MLO synth-2D (1 of 2)]
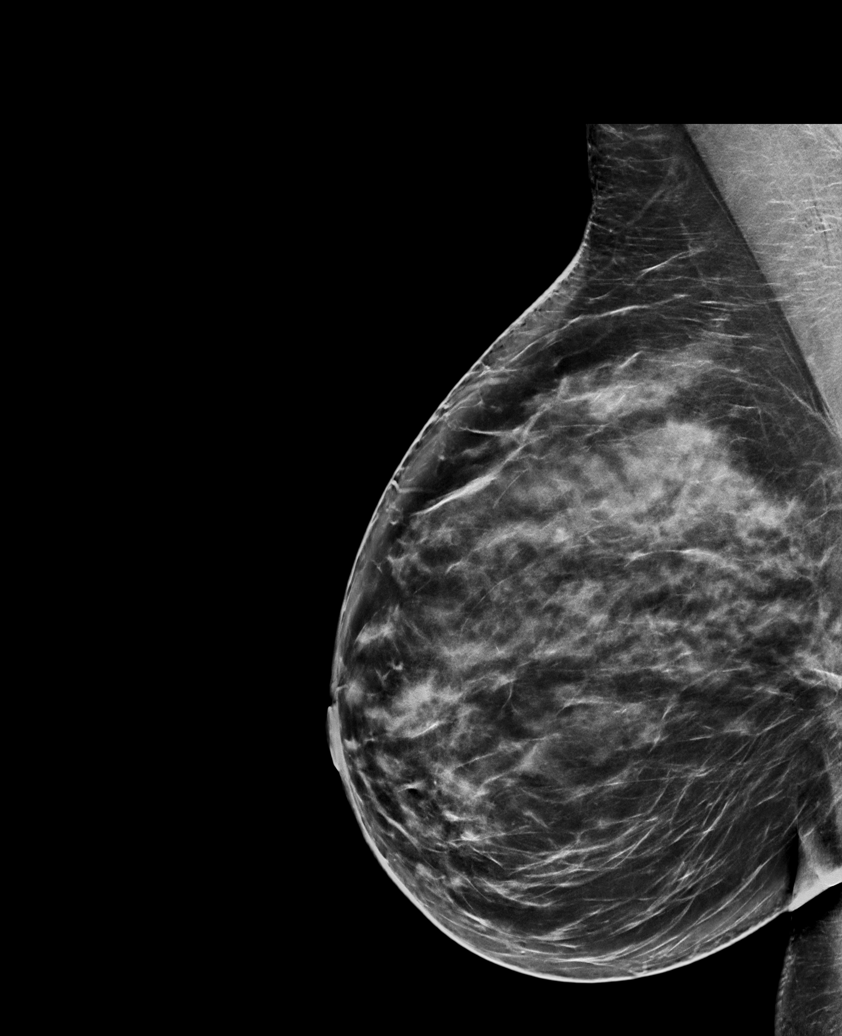

[R CC synth-2D]
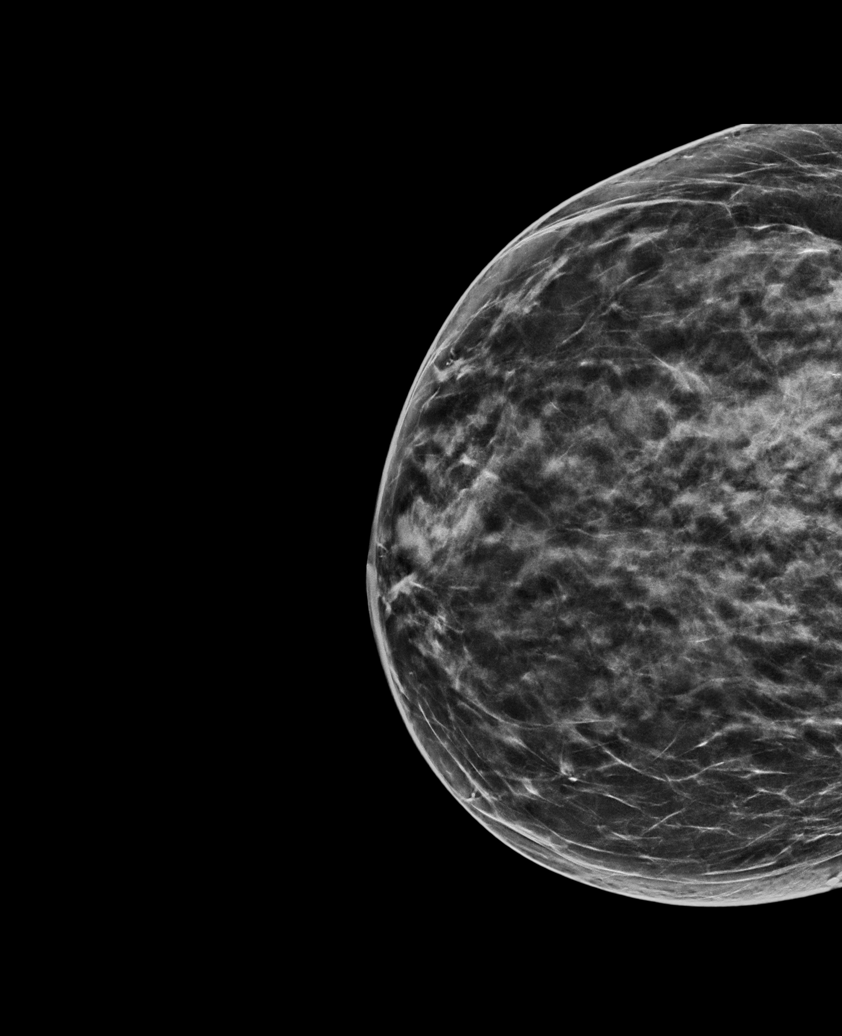

[L MLO synth-2D]
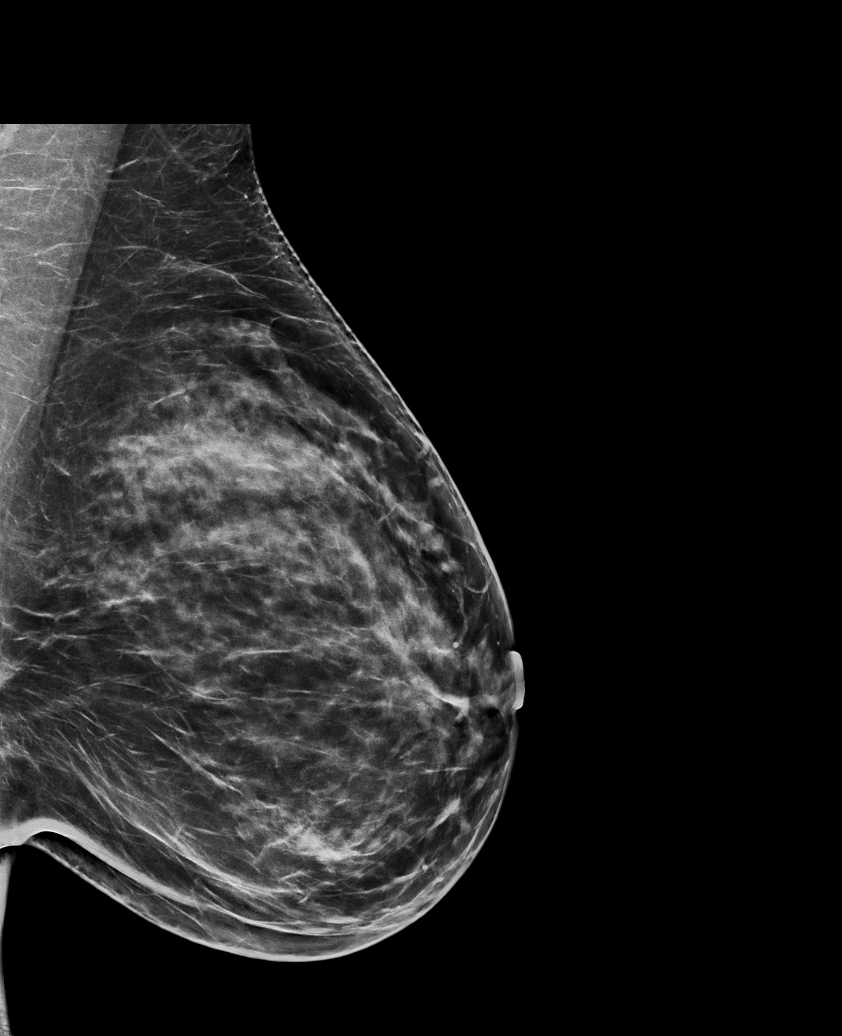

[R MLO synth-2D (2 of 2)]
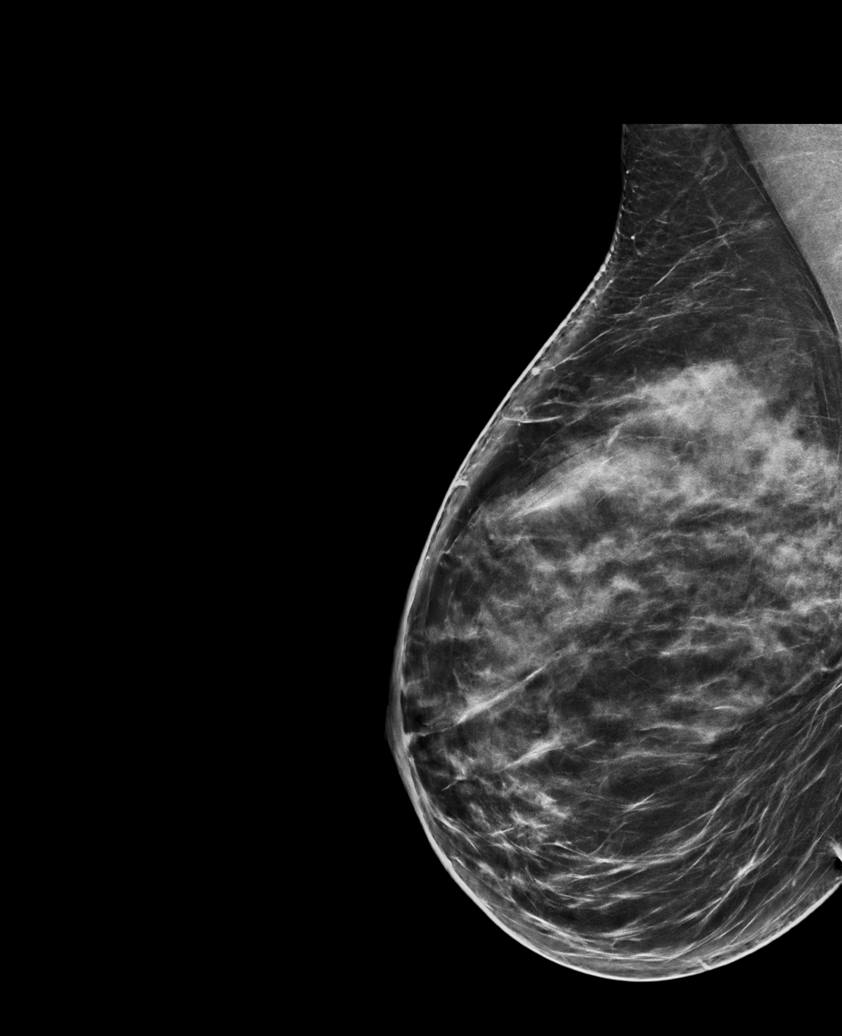

[R MLO tomo · tomo slice 45/88.0]
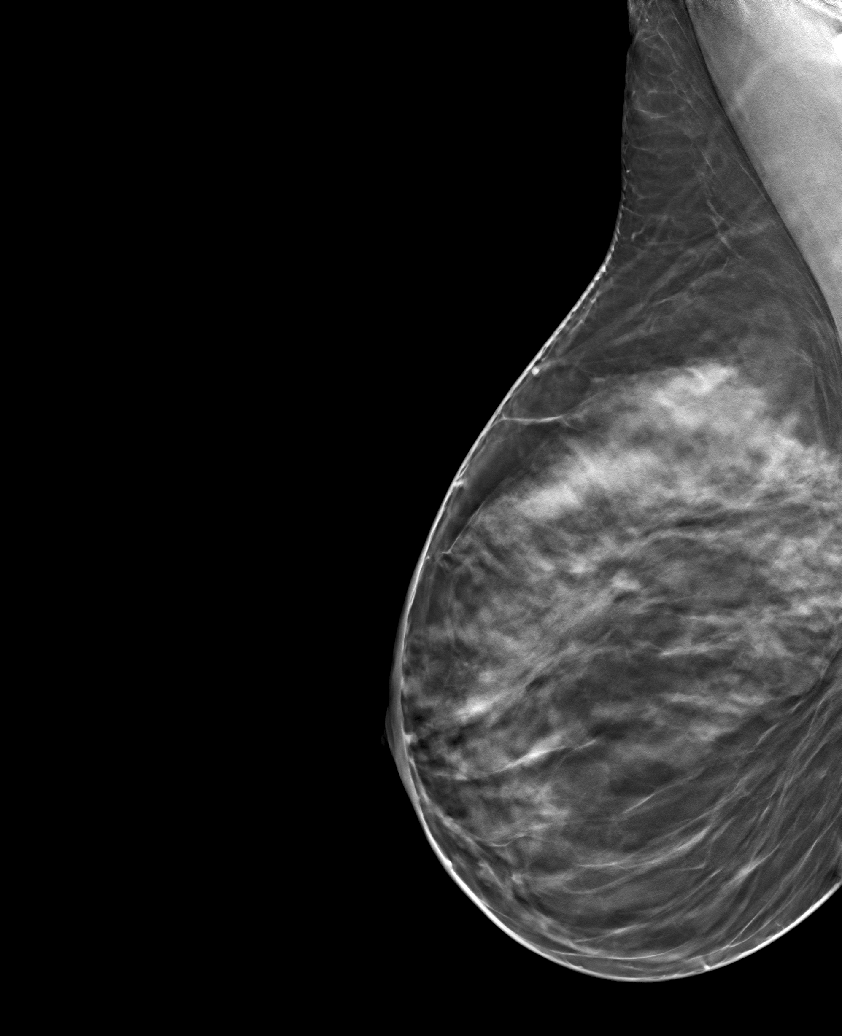

[6 of 30 positions shown; findings below may reference images not displayed]

ACR Breast Density Category c: The breast tissue is heterogeneously
dense, which may obscure small masses
FINDINGS: There are no findings suspicious for malignancy.
IMPRESSION: No mammographic evidence of malignancy. A result letter of this
screening mammogram will be mailed directly to the patient.

RECOMMENDATION:
Screening mammogram in one year. (Code:C8-T-HNK)

BI-RADS CATEGORY  1: Negative.

## 2022-11-12 ENCOUNTER — Ambulatory Visit: Payer: Managed Care, Other (non HMO) | Admitting: Family

## 2022-11-12 ENCOUNTER — Encounter: Payer: Self-pay | Admitting: Family

## 2022-11-12 ENCOUNTER — Ambulatory Visit
Admission: RE | Admit: 2022-11-12 | Discharge: 2022-11-12 | Disposition: A | Discharge status: Home / Self Care | Payer: Managed Care, Other (non HMO) | Source: Ambulatory Visit | Attending: Obstetrics and Gynecology | Admitting: Obstetrics and Gynecology

## 2022-11-12 VITALS — BP 122/78 | HR 107 | Temp 98.1°F | Ht 64.0 in | Wt 175.6 lb

## 2022-11-12 DIAGNOSIS — R5383 Other fatigue: Secondary | ICD-10-CM | POA: Insufficient documentation

## 2022-11-12 DIAGNOSIS — Z1231 Encounter for screening mammogram for malignant neoplasm of breast: Secondary | ICD-10-CM | POA: Diagnosis present

## 2022-11-12 DIAGNOSIS — F909 Attention-deficit hyperactivity disorder, unspecified type: Secondary | ICD-10-CM

## 2022-11-12 NOTE — Patient Instructions (Signed)
Referral to pulmonology.  Let us know if you dont hear back within a week in regards to an appointment being scheduled.   So that you are aware, if you are [facility] MyChart user , please pay attention to your MyChart messages as you may receive a MyChart message with a phone number to call and schedule this test/appointment own your own from our referral coordinator. This is a new process so I do not want you to miss this message.  If you are not a MyChart user, you will receive a phone call.   Nice to meet you.

## 2022-11-12 NOTE — Progress Notes (Signed)
Assessment & Plan:  Other fatigue Assessment & Plan: Fatigue nonspecific at this time.  Discussed concern for sleep apnea and I  placed a referral to pulmonology.  Pending thorough lab investigation as well.  Patient will let me know if any symptoms were to acutely change or new symptoms develop.  Close follow-up  Orders: -     Ambulatory referral to Pulmonology -     TSH -     CBC with Differential/Platelet -     Comprehensive metabolic panel -     Hemoglobin A1c -     VITAMIN D 25 Hydroxy (Vit-D Deficiency, Fractures) -     B12 and Folate Panel  Attention deficit hyperactivity disorder (ADHD), unspecified ADHD type     Return precautions given.   Risks, benefits, and alternatives of the medications and treatment plan prescribed today were discussed, and patient expressed understanding.   Education regarding symptom management and diagnosis given to patient on AVS either electronically or printed.  Return in about 3 months (around 02/12/2023).  Mable Paris, FNP  Subjective:    Patient ID: Janet Gallegos, female    DOB: Jun 27, 1981, 42 y.o.   MRN: [id number]  CC: Janet Gallegos is a 42 y.o. female who presents today to establish care.    HPI: She complains fatigue for over a year, worsening.  Wakes up tired. Sleep is not restorative.  She thinks she may snore. She gets 6 hours of sleep per night which is typical  normal for her.   H/o migraine  Exercise is limited due to fatigue. Exercise makes fatigue worse.   Denies depression or anxiety    She follows with neurology Amy Lomax for migraine  she follows with Elmo [facility]t [facility], GYN.    She also follows with urology, Dr. Bernardo Heater for h/o renal stone  Follows with [facility] whom manages ADHD, vyvanse  Allergies: Sulfa antibiotics, Percocet [oxycodone-acetaminophen], Propranolol, and Topamax [topiramate] Current Outpatient Medications on File Prior to Visit  Medication Sig Dispense  Refill   albuterol (VENTOLIN HFA) 108 (90 Base) MCG/ACT inhaler Inhale into the lungs.     AMZEEQ 4 % FOAM      botulinum toxin Type A (BOTOX) 100 units SOLR injection INJECT 155 UNITS INTRAMUSCULARLY EVERY 12 WEEKS (GIVEN AT MD  OFFICE, DISCARD UNUSED) 2 each 1   budesonide-formoterol (SYMBICORT) 160-4.5 MCG/ACT inhaler Inhale 2 puffs into the lungs 2 (two) times daily.     cetirizine (ZYRTEC) 10 MG tablet Take 10 mg by mouth daily.     EPINEPHrine 0.3 mg/0.3 mL IJ SOAJ injection See admin instructions.     Fremanezumab-vfrm (AJOVY) 225 MG/1.5ML SOAJ Inject 225 mg into the skin every 30 (thirty) days. 4.5 mL 3   GNP 24 HOUR NASAL ALLERGY 55 MCG/ACT AERO nasal inhaler SMARTSIG:Both Nares     hydroquinone 4 % cream Apply topically.     levonorgestrel (MIRENA) 20 MCG/24HR IUD 1 each by Intrauterine route once.     lisdexamfetamine (VYVANSE) 70 MG capsule Take 60 mg by mouth every morning.     NURTEC 75 MG TBDP Take 1 tablet by mouth daily as needed. 30 tablet 3   ondansetron (ZOFRAN) 4 MG tablet Take 1 tablet (4 mg total) by mouth every 8 (eight) hours as needed for nausea or vomiting. 20 tablet 0   Spacer/Aero-Holding Chambers (AEROCHAMBER MV) inhaler See admin instructions.     sucralfate (CARAFATE) 1 g tablet      No current facility-administered medications  on file prior to visit.    Review of Systems  Constitutional:  Negative for chills and fever.  Respiratory:  Negative for cough.   Cardiovascular:  Negative for chest pain and palpitations.  Gastrointestinal:  Negative for nausea and vomiting.      Objective:    BP 122/78   Pulse (!) 107   Temp 98.1 F (36.7 C) (Oral)   Ht '5\' 4"'$  (1.626 m)   Wt 175 lb 9.6 oz (79.7 kg)   SpO2 99%   BMI 30.14 kg/m  BP Readings from Last 3 Encounters:  11/12/22 122/78  07/29/22 122/82  06/17/22 100/70   Wt Readings from Last 3 Encounters:  11/12/22 175 lb 9.6 oz (79.7 kg)  07/29/22 168 lb (76.2 kg)  06/17/22 168 lb (76.2 kg)     Physical Exam Vitals reviewed.  Constitutional:      Appearance: She is well-developed.  Eyes:     Conjunctiva/sclera: Conjunctivae normal.  Neck:     Thyroid: No thyroid mass or thyromegaly.  Cardiovascular:     Rate and Rhythm: Normal rate and regular rhythm.     Pulses: Normal pulses.     Heart sounds: Normal heart sounds.  Pulmonary:     Effort: Pulmonary effort is normal.     Breath sounds: Normal breath sounds. No wheezing, rhonchi or rales.  Lymphadenopathy:     Head:     Right side of head: No submental, submandibular, tonsillar, preauricular, posterior auricular or occipital adenopathy.     Left side of head: No submental, submandibular, tonsillar, preauricular, posterior auricular or occipital adenopathy.     Cervical: No cervical adenopathy.  Skin:    General: Skin is warm and dry.  Neurological:     Mental Status: She is alert.  Psychiatric:        Speech: Speech normal.        Behavior: Behavior normal.        Thought Content: Thought content normal.

## 2022-11-12 NOTE — Assessment & Plan Note (Signed)
Fatigue nonspecific at this time.  Discussed concern for sleep apnea and I  placed a referral to pulmonology.  Pending thorough lab investigation as well.  Patient will let me know if any symptoms were to acutely change or new symptoms develop.  Close follow-up

## 2022-11-25 LAB — CBC WITH DIFFERENTIAL/PLATELET
Basophils Absolute: 0.1 x10E3/uL (ref 0.0–0.2)
Basos: 1 %
EOS (ABSOLUTE): 0.1 x10E3/uL (ref 0.0–0.4)
Eos: 2 %
Hematocrit: 42.6 % (ref 34.0–46.6)
Hemoglobin: 13.6 g/dL (ref 11.1–15.9)
Immature Grans (Abs): 0 x10E3/uL (ref 0.0–0.1)
Immature Granulocytes: 0 %
Lymphocytes Absolute: 1.9 x10E3/uL (ref 0.7–3.1)
Lymphs: 33 %
MCH: 28.8 pg (ref 26.6–33.0)
MCHC: 31.9 g/dL (ref 31.5–35.7)
MCV: 90 fL (ref 79–97)
Monocytes Absolute: 0.5 x10E3/uL (ref 0.1–0.9)
Monocytes: 8 %
Neutrophils Absolute: 3.3 x10E3/uL (ref 1.4–7.0)
Neutrophils: 56 %
Platelets: 247 x10E3/uL (ref 150–450)
RBC: 4.73 x10E6/uL (ref 3.77–5.28)
RDW: 12.5 % (ref 11.7–15.4)
WBC: 5.8 x10E3/uL (ref 3.4–10.8)

## 2022-11-25 LAB — COMPREHENSIVE METABOLIC PANEL WITH GFR
ALT: 16 IU/L (ref 0–32)
AST: 15 IU/L (ref 0–40)
Albumin/Globulin Ratio: 1.6 (ref 1.2–2.2)
Albumin: 4.2 g/dL (ref 3.9–4.9)
Alkaline Phosphatase: 58 IU/L (ref 44–121)
BUN/Creatinine Ratio: 17 (ref 9–23)
BUN: 12 mg/dL (ref 6–24)
Bilirubin Total: 1.3 mg/dL — ABNORMAL HIGH (ref 0.0–1.2)
CO2: 23 mmol/L (ref 20–29)
Calcium: 9.3 mg/dL (ref 8.7–10.2)
Chloride: 103 mmol/L (ref 96–106)
Creatinine, Ser: 0.7 mg/dL (ref 0.57–1.00)
Globulin, Total: 2.6 g/dL (ref 1.5–4.5)
Glucose: 100 mg/dL — ABNORMAL HIGH (ref 70–99)
Potassium: 4.8 mmol/L (ref 3.5–5.2)
Sodium: 140 mmol/L (ref 134–144)
Total Protein: 6.8 g/dL (ref 6.0–8.5)
eGFR: 111 mL/min/1.73

## 2022-11-25 LAB — B12 AND FOLATE PANEL
Folate: 9.5 ng/mL (ref >3.0)
Vitamin B-12: 722 pg/mL (ref 232–1245)

## 2022-11-25 LAB — TSH: TSH: 1.81 u[IU]/mL (ref 0.450–4.500)

## 2022-11-25 LAB — HEMOGLOBIN A1C
Est. average glucose Bld gHb Est-mCnc: 111 mg/dL
Hgb A1c MFr Bld: 5.5 % (ref 4.8–5.6)

## 2022-11-25 LAB — VITAMIN D 25 HYDROXY (VIT D DEFICIENCY, FRACTURES): Vit D, 25-Hydroxy: 29.4 ng/mL — ABNORMAL LOW (ref 30.0–100.0)

## 2022-11-26 ENCOUNTER — Ambulatory Visit: Payer: Managed Care, Other (non HMO) | Admitting: Adult Health

## 2022-11-26 ENCOUNTER — Encounter: Payer: Self-pay | Admitting: Adult Health

## 2022-11-26 VITALS — BP 108/86 | HR 112 | Temp 98.1°F | Ht 64.0 in | Wt 172.4 lb

## 2022-11-26 DIAGNOSIS — R400 Somnolence: Secondary | ICD-10-CM | POA: Insufficient documentation

## 2022-11-26 DIAGNOSIS — R0683 Snoring: Secondary | ICD-10-CM

## 2022-11-26 NOTE — Assessment & Plan Note (Signed)
Daytime sleepiness, restless sleep, poor sleep hygiene, teeth grinding, concerning for underlying sleep apnea.  Will set patient up for home sleep study.  Long discussion regarding healthy sleep regimen.  - discussed how weight can impact sleep and risk for sleep disordered breathing - discussed options to assist with weight loss: combination of diet modification, cardiovascular and strength training exercises   - had an extensive discussion regarding the adverse health consequences related to untreated sleep disordered breathing - specifically discussed the risks for hypertension, coronary artery disease, cardiac dysrhythmias, cerebrovascular disease, and diabetes - lifestyle modification discussed   - discussed how sleep disruption can increase risk of accidents, particularly when driving - safe driving practices were discussed   Plan  Patient Instructions  Set up for home sleep study  Work on healthy sleep regimen  Do not drive if sleepy  Follow up with in 6-8 weeks to discuss sleep study results and treatment plan .

## 2022-11-26 NOTE — Patient Instructions (Signed)
Set up for home sleep study  Work on healthy sleep regimen  Do not drive if sleepy  Follow up with in 6-8 weeks to discuss sleep study results and treatment plan .

## 2022-11-26 NOTE — Progress Notes (Signed)
@Patient  ID: Janet Gallegos, female    DOB: [birth year], 42 y.o.   MRN: [id number]  Chief Complaint  Patient presents with   Consult    Referring provider: Burnard Hawthorne, FNP  HPI: 42 yo female seen for sleep consult 11/26/22 for restless sleep, mild snoring, daytime sleepiness   TEST/EVENTS :   11/26/2022 Sleep consult  Patient presents for a sleep consult today for restless sleep, mild snoring and daytime sleepiness.  Patient feels that her sleep is very fragmented.  Feels that she never feels refreshed when she wakes up.  Always feels that she is sleepy.  She does have to set herself several alarms because she feels sleepy in the morning hours and hard to get up.  Patient works second shift typically 12 noon to about 830 to 9 PM.  Typically goes to bed between 12 PM and 3 AM.  Gets up between 8 AM and 10 AM.  Patient says it can take up to 45 minutes to go to sleep.  It is up multiple times throughout the night.  She does not nap.  Says she does not like to nap.  She does have ADHD.  Is been on Vyvanse for several years.  She has no history of congestive heart failure or stroke.  She does have teeth grinding.  No restless leg symptoms.  She does not take any caffeine intake.  Patient says she has tried several sleep aids in the past with no perceived benefit.  Felt that they helped her go to sleep but not stay asleep.  She has tried Ambien and trazodone in the past.  Patient says she did have a sleep study years and years ago was told that she did not have sleep apnea.  Weight has been up and down over the last couple years.  Current weight is at 172 pounds with a BMI 29.  No symptoms suspicious for cataplexy or sleep paralysis.  Epworth score is 4 out of 24.  Typically gets sleepy if she sits down to read or watch TV.  And in the evening hours.  Social history patient is single.  Has no children.  She works as a Psychiatrist.  She does travel.  She does have home pets.  She is a  former smoker.  Social alcohol.  No drug use.  Medical history significant for asthma, hives, chronic allergies, migraines, GERD, ADHD.  Past Surgical History:  Procedure Laterality Date   CHONDROPLASTY Right 11/10/2014   Procedure: CHONDROPLASTY;  Surgeon: Melrose Nakayama, MD;  Location: [facility];  Service: Orthopedics;  Laterality: Right;   DIAGNOSTIC LAPAROSCOPY     Prior to 2010 x 2   KNEE ARTHROSCOPY W/ LATERAL RELEASE Right 07/20/2008   KNEE ARTHROSCOPY WITH FULKERSON SLIDE Right 11/10/2014   Procedure: RIGHT KNEE ARTHROSCOPY WITH Elmarie Mainland;  Surgeon: Melrose Nakayama, MD;  Location: [facility];  Service: Orthopedics;  Laterality: Right;   KNEE ARTHROSCOPY WITH LATERAL RELEASE Right 11/10/2014   Procedure: KNEE ARTHROSCOPY WITH LATERAL RELEASE;  Surgeon: Melrose Nakayama, MD;  Location: [facility];  Service: Orthopedics;  Laterality: Right;   SHOULDER ARTHROSCOPY  2020   Left    TONSILLECTOMY N/A 07/31/2015   Procedure: TONSILLECTOMY;  Surgeon: Clyde Canterbury, MD;  Location: [facility];  Service: ENT;  Laterality: N/A;   UPPER GI ENDOSCOPY       Allergies  Allergen Reactions   Sulfa Antibiotics Nausea And Vomiting   Percocet [Oxycodone-Acetaminophen]  Nausea And Vomiting   Propranolol     Rash   Topamax [Topiramate]     Nausea    Immunization History  Administered Date(s) Administered   Influenza,inj,Quad PF,6+ Mos 08/27/2022   PFIZER(Purple Top)SARS-COV-2 Vaccination 10/31/2019, 11/30/2019, 06/08/2020   Tdap 09/25/2019    Past Medical History:  Diagnosis Date   Acid reflux    ADHD    Asthma    prn inhaler   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   Kidney stone    Migraines     Tobacco History: Social History   Tobacco Use  Smoking Status Former   Packs/day: 0.50   Years: 4.00   Additional pack years: 0.00   Total pack years: 2.00   Types: Cigarettes   Quit date: 01/11/2015   Years since quitting: 7.8  Smokeless Tobacco Never  Tobacco Comments       Counseling given: Not Answered Tobacco comments:     Outpatient Medications Prior to Visit  Medication Sig Dispense Refill   albuterol (VENTOLIN HFA) 108 (90 Base) MCG/ACT inhaler Inhale into the lungs.     AMZEEQ 4 % FOAM      botulinum toxin Type A (BOTOX) 100 units SOLR injection INJECT 155 UNITS INTRAMUSCULARLY EVERY 12 WEEKS (GIVEN AT MD  OFFICE, DISCARD UNUSED) 2 each 1   budesonide-formoterol (SYMBICORT) 160-4.5 MCG/ACT inhaler Inhale 2 puffs into the lungs 2 (two) times daily.     cetirizine (ZYRTEC) 10 MG tablet Take 10 mg by mouth daily as needed.     EPINEPHrine 0.3 mg/0.3 mL IJ SOAJ injection See admin instructions.     Fremanezumab-vfrm (AJOVY) 225 MG/1.5ML SOAJ Inject 225 mg into the skin every 30 (thirty) days. 4.5 mL 3   GNP 24 HOUR NASAL ALLERGY 55 MCG/ACT AERO nasal inhaler SMARTSIG:Both Nares     hydroquinone 4 % cream Apply topically.     levonorgestrel (MIRENA) 20 MCG/24HR IUD 1 each by Intrauterine route once.     lisdexamfetamine (VYVANSE) 70 MG capsule Take 60 mg by mouth every morning.     NURTEC 75 MG TBDP Take 1 tablet by mouth daily as needed. 30 tablet 3   ondansetron (ZOFRAN) 4 MG tablet Take 1 tablet (4 mg total) by mouth every 8 (eight) hours as needed for nausea or vomiting. 20 tablet 0   Spacer/Aero-Holding Chambers (AEROCHAMBER MV) inhaler See admin instructions.     sucralfate (CARAFATE) 1 g tablet      No facility-administered medications prior to visit.     Review of Systems:   Constitutional:   No  weight  loss, night sweats,  Fevers, chills, + fatigue, or  lassitude.  HEENT:   No headaches,  Difficulty swallowing,  Tooth/dental problems, or  Sore throat,                No sneezing, itching, ear ache, nasal congestion, post nasal drip,   CV:  No chest pain,  Orthopnea, PND, swelling in lower extremities, anasarca, dizziness, palpitations, syncope.   GI  No heartburn, indigestion,  abdominal pain, nausea, vomiting, diarrhea, change in bowel habits, loss of appetite, bloody stools.   Resp: No shortness of breath with exertion or at rest.  No excess mucus, no productive cough,  No non-productive cough,  No coughing up of blood.  No change in color of mucus.  No wheezing.  No chest wall deformity  Skin: no rash or lesions.  GU: no dysuria, change in color of urine, no urgency or frequency.  No flank pain, no hematuria   MS:  No joint pain or swelling.  No decreased range of motion.  No back pain.    Physical Exam  BP 108/86 (BP Location: Left Arm, Patient Position: Sitting, Cuff Size: Normal)   Pulse (!) 112   Temp 98.1 F (36.7 C) (Oral)   Ht 5\' 4"  (1.626 m)   Wt 172 lb 6.4 oz (78.2 kg)   SpO2 99%   BMI 29.59 kg/m   GEN: A/Ox3; pleasant , NAD, well nourished    HEENT:  [place]/AT,  NOSE-clear, THROAT-clear, no lesions, no postnasal drip or exudate noted. Class 2-3 MP airway   NECK:  Supple w/ fair ROM; no JVD; normal carotid impulses w/o bruits; no thyromegaly or nodules palpated; no lymphadenopathy.    RESP  Clear  P & A; w/o, wheezes/ rales/ or rhonchi. no accessory muscle use, no dullness to percussion  CARD:  RRR, no m/r/g, no peripheral edema, pulses intact, no cyanosis or clubbing.  GI:   Soft & nt; nml bowel sounds; no organomegaly or masses detected.   Musco: Warm bil, no deformities or joint swelling noted.   Neuro: alert, no focal deficits noted.    Skin: Warm, no lesions or rashes    Lab Results:  CBC     BNP No results found for: "BNP"  ProBNP No  results found for: "PROBNP"  Imaging:   botulinum toxin Type A (BOTOX) injection 155 Units     Date Action Dose Route User   10/21/2022 1552 Given 155 Units Intramuscular (Other) Lomax, Amy, NP           No data to display          No results found for: "NITRICOXIDE"      Assessment & Plan:   Daytime sleepiness Daytime sleepiness, restless sleep, poor sleep hygiene, teeth grinding, concerning for underlying sleep apnea.  Will set patient up for home sleep study.  Long discussion regarding healthy sleep regimen.  - discussed how weight can impact sleep and risk for sleep disordered breathing - discussed options to assist with weight loss: combination of diet modification, cardiovascular and strength training exercises   - had an extensive discussion regarding the adverse health consequences related to untreated sleep disordered breathing - specifically discussed the risks for hypertension, coronary artery disease, cardiac dysrhythmias, cerebrovascular disease, and diabetes - lifestyle modification discussed   - discussed how sleep disruption can increase risk of accidents, particularly when driving - safe driving practices were discussed   Plan  Patient Instructions  Set up for home sleep study  Work on healthy sleep regimen  Do not drive if sleepy  Follow up with in 6-8 weeks to discuss sleep study results and treatment plan .        Rexene Edison, NP 11/26/2022

## 2022-12-15 ENCOUNTER — Other Ambulatory Visit: Payer: Self-pay | Admitting: *Deleted

## 2022-12-15 DIAGNOSIS — Z87442 Personal history of urinary calculi: Secondary | ICD-10-CM

## 2022-12-17 ENCOUNTER — Ambulatory Visit: Payer: Managed Care, Other (non HMO) | Admitting: Urology

## 2022-12-18 ENCOUNTER — Telehealth: Payer: Self-pay | Admitting: Family Medicine

## 2022-12-18 DIAGNOSIS — G43019 Migraine without aura, intractable, without status migrainosus: Secondary | ICD-10-CM

## 2022-12-18 NOTE — Telephone Encounter (Signed)
Janet Gallegos from [facility]  called. Stated they need a prescription for Botox 200.

## 2022-12-22 ENCOUNTER — Other Ambulatory Visit (HOSPITAL_COMMUNITY): Payer: Self-pay

## 2022-12-22 NOTE — Telephone Encounter (Signed)
Benefit Verification [id number] Submitted! Botox One-

## 2022-12-24 ENCOUNTER — Other Ambulatory Visit (HOSPITAL_COMMUNITY): Payer: Self-pay

## 2022-12-24 MED ORDER — BOTOX 100 UNITS IJ SOLR: INTRAMUSCULAR | 1 refills | Status: DC

## 2022-12-24 NOTE — Telephone Encounter (Signed)
Angel/Jillian- I sent in updated rx to [facility].  Can you help set up shipment for upcoming botox appt on 01/14/23?

## 2022-12-24 NOTE — Addendum Note (Signed)
Addended by: Arther Abbott on: 12/24/2022 02:20 PM   Modules accepted: Orders

## 2022-12-24 NOTE — Telephone Encounter (Signed)
   Looks like the PT is approved for Both Buy and Bill or S/P.

## 2022-12-30 ENCOUNTER — Other Ambulatory Visit (HOSPITAL_COMMUNITY): Payer: Self-pay

## 2022-12-31 ENCOUNTER — Ambulatory Visit: Payer: Managed Care, Other (non HMO) | Admitting: Urology

## 2023-01-01 ENCOUNTER — Telehealth: Payer: Self-pay | Admitting: Pulmonary Disease

## 2023-01-01 DIAGNOSIS — G4733 Obstructive sleep apnea (adult) (pediatric): Secondary | ICD-10-CM

## 2023-01-01 DIAGNOSIS — R0683 Snoring: Secondary | ICD-10-CM

## 2023-01-01 NOTE — Telephone Encounter (Signed)
ATC x1 LVM for patient to call our office back for Sleep study result's.

## 2023-01-01 NOTE — Telephone Encounter (Signed)
Call patient  Sleep study result  Date of study: 12/16/2022  Impression: Mild obstructive sleep apnea mild oxygen desaturations   Recommendation: Options of treatment for mild obstructive sleep apnea will include  1.  CPAP therapy if there is significant daytime sleepiness or other comorbidities including history of CVA or cardiac disease  -If CPAP is chosen as an option of treatment auto titrating CPAP with a pressure setting of 5-15 will be appropriate  2.  Watchful waiting with emphasis on weight loss measures, sleep position modification to optimize lateral sleep, elevating the head of the bed by about 30 degrees may also help.  3.  An oral device may be fashioned for the treatment of mild sleep disordered breathing, will involve referral to dentist.  Follow-up as previously scheduled

## 2023-01-05 ENCOUNTER — Encounter: Payer: Self-pay | Admitting: Family Medicine

## 2023-01-06 NOTE — Telephone Encounter (Signed)
Spoke w/ Valli Glance. She will reach out to [facility] today to see if she can schedule pt botox shipment.

## 2023-01-07 ENCOUNTER — Encounter: Payer: Self-pay | Admitting: Adult Health

## 2023-01-07 ENCOUNTER — Ambulatory Visit: Payer: Managed Care, Other (non HMO) | Admitting: Adult Health

## 2023-01-07 VITALS — BP 108/80 | HR 87 | Ht 64.0 in | Wt 173.2 lb

## 2023-01-07 DIAGNOSIS — R400 Somnolence: Secondary | ICD-10-CM | POA: Diagnosis not present

## 2023-01-07 DIAGNOSIS — R0683 Snoring: Secondary | ICD-10-CM | POA: Diagnosis not present

## 2023-01-07 DIAGNOSIS — G4733 Obstructive sleep apnea (adult) (pediatric): Secondary | ICD-10-CM | POA: Diagnosis not present

## 2023-01-07 NOTE — Progress Notes (Signed)
@Patient  ID: Janet Gallegos, female    DOB: 1981/05/26, 42 y.o.   MRN: [id number]  Chief Complaint  Patient presents with   Follow-up    Referring provider: Allegra Grana, FNP  HPI: 42 year old female seen for sleep consult November 26, 2022 for restless sleep snoring and daytime sleepiness found to have mild obstructive sleep apnea  TEST/EVENTS :   01/07/2023 Follow up : OSA  Patient returns for a 6-week follow-up.  Patient was seen last visit for sleep consult.  She had restless sleep, snoring and daytime sleepiness.  She was set up for home sleep study that was completed on December 16, 2022 this showed mild obstructive sleep apnea with AHI at 8.1/hour and SpO2 low at 88%.  We discussed her sleep study results in detail.  Went over treatment options including weight loss, positional sleep, oral appliance and CPAP therapy.  Patient would like to proceed with CPAP therapy.  Patient education was given on CPAP care.  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   Kidney stone    Migraines      Tobacco History: Social History   Tobacco Use  Smoking Status Former   Packs/day: 0.50   Years: 4.00   Additional pack years: 0.00   Total pack years: 2.00   Types: Cigarettes   Quit date: 01/11/2015   Years since quitting: 7.9  Smokeless Tobacco Never  Tobacco Comments       Counseling given: Not Answered Tobacco comments:     Outpatient Medications Prior to Visit  Medication Sig Dispense Refill   albuterol (VENTOLIN HFA) 108 (90 Base) MCG/ACT inhaler Inhale into the lungs.     AMZEEQ 4 % FOAM      botulinum toxin Type A (BOTOX) 100 units SOLR injection Provider to inject 155 units into the muscles of the head and neck every 3 months. Discard remainder 2 each 1   budesonide-formoterol (SYMBICORT) 160-4.5 MCG/ACT inhaler Inhale 2 puffs into the lungs 2 (two) times daily.     cetirizine (ZYRTEC) 10 MG tablet Take 10 mg by mouth daily as needed.     EPINEPHrine 0.3 mg/0.3 mL IJ SOAJ injection See admin instructions.     Fremanezumab-vfrm (AJOVY) 225 MG/1.5ML SOAJ Inject 225 mg into the skin every 30 (thirty) days. 4.5 mL 3   GNP 24 HOUR NASAL ALLERGY 55 MCG/ACT AERO nasal inhaler SMARTSIG:Both Nares     hydroquinone 4 % cream Apply topically.     levonorgestrel (MIRENA) 20 MCG/24HR  IUD 1 each by Intrauterine route once.     lisdexamfetamine (VYVANSE) 70 MG capsule Take 60 mg by mouth every morning.     NURTEC 75 MG TBDP Take 1 tablet by mouth daily as needed. 30 tablet 3   ondansetron (ZOFRAN) 4 MG tablet Take 1 tablet (4 mg total) by mouth every 8 (eight) hours as needed for nausea or vomiting. 20 tablet 0   Spacer/Aero-Holding Chambers (AEROCHAMBER MV) inhaler See admin instructions.     spironolactone (ALDACTONE) 50 MG tablet Take 50 mg by mouth 2 (two) times daily.     sucralfate (CARAFATE) 1 g tablet      No facility-administered medications prior to visit.     Review of Systems:   Constitutional:   No  weight loss, night sweats,  Fevers, chills, +fatigue, or   lassitude.  HEENT:   No headaches,  Difficulty swallowing,  Tooth/dental problems, or  Sore throat,                No sneezing, itching, ear ache, nasal congestion, post nasal drip,   CV:  No chest pain,  Orthopnea, PND, swelling in lower extremities, anasarca, dizziness, palpitations, syncope.   GI  No heartburn, indigestion, abdominal pain, nausea, vomiting, diarrhea, change in bowel habits, loss of appetite, bloody stools.   Resp: No shortness of breath with exertion or at rest.  No excess mucus, no productive cough,  No non-productive cough,  No coughing up of blood.  No change in color of mucus.  No wheezing.  No chest wall deformity  Skin: no rash or lesions.  GU: no dysuria, change in color of urine, no urgency or frequency.  No flank pain, no hematuria   MS:  No joint pain or swelling.  No decreased range of motion.  No back pain.    Physical Exam  BP 108/80 (BP Location: Left Arm, Patient Position: Sitting, Cuff Size: Large)   Pulse 87   Ht 5\' 4"  (1.626 m)   Wt 173 lb 3.2 oz (78.6 kg)   SpO2 98%   BMI 29.73 kg/m   GEN: A/Ox3; pleasant , NAD, well nourished    HEENT:  [place]/AT,  EACs-clear, TMs-wnl, NOSE-clear, THROAT-clear, no lesions, no postnasal drip or exudate noted. Class 2-3  MP airway   NECK:  Supple w/ fair ROM; no JVD; normal carotid impulses w/o bruits; no thyromegaly or nodules palpated; no lymphadenopathy.    RESP  Clear  P & A; w/o, wheezes/ rales/ or rhonchi. no accessory muscle use, no dullness to percussion  CARD:  RRR, no m/r/g, no peripheral edema, pulses intact, no cyanosis or clubbing.  GI:   Soft & nt; nml bowel sounds; no organomegaly or masses detected.   Musco: Warm bil, no deformities or joint swelling noted.   Neuro: alert, no focal deficits noted.    Skin: Warm, no lesions or rashes    Lab Results:  CBC    Component Value Date/Time   WBC 5.8 11/24/2022 1024   RBC 4.73 11/24/2022 1024   HGB 13.6 11/24/2022 1024   HCT 42.6  11/24/2022 1024   PLT 247 11/24/2022 1024   MCV 90 11/24/2022 1024   MCH 28.8 11/24/2022 1024   MCHC 31.9 11/24/2022 1024   RDW 12.5 11/24/2022 1024   LYMPHSABS 1.9 11/24/2022 1024   EOSABS 0.1 11/24/2022 1024   BASOSABS 0.1 11/24/2022 1024    BMET    Component Value Date/Time   NA 140 11/24/2022 1024   K  4.8 11/24/2022 1024   CL 103 11/24/2022 1024   CO2 23 11/24/2022 1024   GLUCOSE 100 (H) 11/24/2022 1024   BUN 12 11/24/2022 1024   CREATININE 0.70 11/24/2022 1024   CALCIUM 9.3 11/24/2022 1024    BNP No results found for: "BNP"  ProBNP No results found for: "PROBNP"  Imaging: No results found.        No data to display          No results found for: "NITRICOXIDE"      Assessment & Plan:   OSA (obstructive sleep apnea) Mild OSA -patient education given on sleep apnea.  Patient would like to proceed with CPAP therapy will begin auto CPAP 5 to 15 cm H2O.  - discussed how weight can impact sleep and risk for sleep disordered breathing - discussed options to assist with weight loss: combination of diet modification, cardiovascular and strength training exercises   - had an extensive discussion regarding the adverse health consequences related to untreated sleep disordered breathing - specifically discussed the risks for hypertension, coronary artery disease, cardiac dysrhythmias, cerebrovascular disease, and diabetes - lifestyle modification discussed   - discussed how sleep disruption can increase risk of accidents, particularly when driving - safe driving practices were discussed   CPAP care was discussed  Plan  Patient Instructions  Begin CPAP At bedtime  , wear all night long  Work on healthy sleep regimen  Do not drive if sleepy  Follow up with in 3 months and As needed        Rubye Oaks, NP 01/07/2023

## 2023-01-07 NOTE — Assessment & Plan Note (Signed)
Mild OSA -patient education given on sleep apnea.  Patient would like to proceed with CPAP therapy will begin auto CPAP 5 to 15 cm H2O.  - discussed how weight can impact sleep and risk for sleep disordered breathing - discussed options to assist with weight loss: combination of diet modification, cardiovascular and strength training exercises   - had an extensive discussion regarding the adverse health consequences related to untreated sleep disordered breathing - specifically discussed the risks for hypertension, coronary artery disease, cardiac dysrhythmias, cerebrovascular disease, and diabetes - lifestyle modification discussed   - discussed how sleep disruption can increase risk of accidents, particularly when driving - safe driving practices were discussed   CPAP care was discussed  Plan  Patient Instructions  Begin CPAP At bedtime  , wear all night long  Work on healthy sleep regimen  Do not drive if sleepy  Follow up with in 3 months and As needed

## 2023-01-07 NOTE — Patient Instructions (Signed)
Begin CPAP At bedtime  , wear all night long  Work on healthy sleep regimen  Do not drive if sleepy  Follow up with in 3 months and As needed

## 2023-01-08 NOTE — Progress Notes (Signed)
Reviewed and agree with assessment/plan.   Bonetta Mostek, MD [facility] 01/08/2023, 10:49 AM Pager:  [phone]  
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
## 2023-01-14 ENCOUNTER — Ambulatory Visit: Payer: Managed Care, Other (non HMO) | Admitting: Family Medicine

## 2023-01-14 VITALS — BP 122/81 | HR 90 | Ht 64.0 in | Wt 171.2 lb

## 2023-01-14 DIAGNOSIS — G43019 Migraine without aura, intractable, without status migrainosus: Secondary | ICD-10-CM

## 2023-01-14 MED ORDER — ONABOTULINUMTOXINA 100 UNITS IJ SOLR
155.0000 [IU] | Freq: Once | INTRAMUSCULAR | Status: AC
Start: 2023-01-14 — End: 2023-01-14
  Administered 2023-01-14: 155 [IU] via INTRAMUSCULAR

## 2023-01-14 NOTE — Progress Notes (Signed)
Botox-100U x 2vials Lot: [id number] Expiration: 11/2024 NDC: 4540-9811-91  Bacteriostatic 0.9% Sodium Chloride- 4mL total [id number] Expiration:07/2024 NDC: 08657-846-96  Witnessed by Leandra Kern, RMA  Specialty pharmacy

## 2023-01-16 ENCOUNTER — Ambulatory Visit: Payer: Managed Care, Other (non HMO) | Admitting: Urology

## 2023-01-16 ENCOUNTER — Encounter: Payer: Self-pay | Admitting: Urology

## 2023-01-16 ENCOUNTER — Ambulatory Visit
Admission: RE | Admit: 2023-01-16 | Discharge: 2023-01-16 | Disposition: A | Discharge status: Home / Self Care | Payer: Managed Care, Other (non HMO) | Attending: Urology | Admitting: Urology

## 2023-01-16 ENCOUNTER — Ambulatory Visit
Admission: RE | Admit: 2023-01-16 | Discharge: 2023-01-16 | Disposition: A | Discharge status: Home / Self Care | Payer: Managed Care, Other (non HMO) | Source: Ambulatory Visit | Attending: Urology | Admitting: Urology

## 2023-01-16 VITALS — BP 112/78 | HR 92 | Ht 64.0 in | Wt 171.0 lb

## 2023-01-16 DIAGNOSIS — Z87442 Personal history of urinary calculi: Secondary | ICD-10-CM

## 2023-01-16 DIAGNOSIS — Z09 Encounter for follow-up examination after completed treatment for conditions other than malignant neoplasm: Secondary | ICD-10-CM

## 2023-01-16 NOTE — Progress Notes (Signed)
I, Maysun L Gibbs,acting as a scribe for Riki Altes, MD.,have documented all relevant documentation on the behalf of Riki Altes, MD,as directed by  Riki Altes, MD while in the presence of Riki Altes, MD.  01/16/2023 9:05 AM   Kandice Robinsons [place] [birth year] [id number]  Referring provider: Ronal Fear, NP [address] [place],  [place] [address]  Chief Complaint  Patient presents with   Follow-up   Nephrolithiasis   Urologic history: 1.  Personal history urinary calculi Left UVJ calculus 10/2020 which she passed CaOxMono/CaOxDi 10/90  HPI: Janet Gallegos is a 42 y.o. female here for one year follow-up.  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   Kidney stone    Migraines     Surgical History: Past Surgical History:  Procedure Laterality Date   CHONDROPLASTY Right 11/10/2014   Procedure: CHONDROPLASTY;  Surgeon: Marcene Corning, MD;  Location: [facility];  Service: Orthopedics;  Laterality: Right;   DIAGNOSTIC LAPAROSCOPY     Prior to 2010 x 2   KNEE ARTHROSCOPY W/ LATERAL RELEASE Right 07/20/2008   KNEE ARTHROSCOPY WITH FULKERSON SLIDE Right 11/10/2014   Procedure: RIGHT KNEE ARTHROSCOPY WITH Conan Bowens;  Surgeon: Marcene Corning, MD;  Location: [facility];  Service: Orthopedics;  Laterality: Right;   KNEE ARTHROSCOPY  WITH LATERAL RELEASE Right 11/10/2014   Procedure: KNEE ARTHROSCOPY WITH LATERAL RELEASE;  Surgeon: Marcene Corning, MD;  Location: [facility];  Service: Orthopedics;  Laterality: Right;   SHOULDER ARTHROSCOPY  2020   Left    TONSILLECTOMY N/A 07/31/2015   Procedure: TONSILLECTOMY;  Surgeon: Geanie Logan, MD;  Location: [facility];  Service: ENT;  Laterality: N/A;   UPPER GI ENDOSCOPY      Home Medications:  Allergies as of 01/16/2023       Reactions   Sulfa Antibiotics Nausea And Vomiting   Percocet [oxycodone-acetaminophen] Nausea And Vomiting   Propranolol    Rash   Topamax [topiramate]    Nausea        Medication List        Accurate as of Jan 16, 2023  9:05 AM. If you have any questions, ask your nurse or doctor.          AeroChamber MV inhaler See admin instructions.   Ajovy 225 MG/1.5ML Soaj Generic drug: Fremanezumab-vfrm Inject 225 mg into the skin every 30 (thirty) days.   albuterol 108 (90 Base) MCG/ACT inhaler Commonly known as: VENTOLIN HFA Inhale into the lungs.   Amzeeq 4 % Foam Generic drug: Minocycline HCl Micronized   Botox 100 units Solr injection Generic drug: botulinum toxin Type A Provider to inject 155 units into the muscles of the head and neck every 3 months. Discard  remainder   budesonide-formoterol 160-4.5 MCG/ACT inhaler Commonly known as: SYMBICORT Inhale 2 puffs into the lungs 2 (two) times daily.   cetirizine 10 MG tablet Commonly known as: ZYRTEC Take 10 mg by mouth daily as needed.   EPINEPHrine 0.3 mg/0.3 mL Soaj injection Commonly known as: EPI-PEN See admin instructions.   GNP 24 Hour Nasal Allergy 55 MCG/ACT Aero nasal inhaler Generic drug: triamcinolone SMARTSIG:Both Nares   hydroquinone 4 % cream Apply topically.   levonorgestrel 20 MCG/24HR IUD Commonly known as: MIRENA 1 each by Intrauterine route once.   lisdexamfetamine 70 MG capsule Commonly known as: VYVANSE Take 60 mg by  mouth every morning.   Nurtec 75 MG Tbdp Generic drug: Rimegepant Sulfate Take 1 tablet by mouth daily as needed.   ondansetron 4 MG tablet Commonly known as: Zofran Take 1 tablet (4 mg total) by mouth every 8 (eight) hours as needed for nausea or vomiting.   spironolactone 50 MG tablet Commonly known as: ALDACTONE Take 50 mg by mouth 2 (two) times daily.   sucralfate 1 g tablet Commonly known as: CARAFATE        Allergies:  Allergies  Allergen Reactions   Sulfa Antibiotics Nausea And Vomiting   Percocet [Oxycodone-Acetaminophen] Nausea And Vomiting   Propranolol     Rash   Topamax [Topiramate]     Nausea    Family History: Family History  Problem Relation Age of Onset   Anesthesia problems Mother        woke up during surgery   Parkinson's disease Paternal Grandfather    Migraines Neg Hx    Breast cancer Neg Hx    Colon cancer Neg Hx     Social History:  reports that she quit smoking about 8 years ago. Her smoking use included cigarettes. She has a 2.00 pack-year smoking history. She has been exposed to tobacco smoke. She has never used smokeless tobacco. She reports current alcohol use of about 1.0 - 2.0 standard drink of alcohol per week. She reports that she does not use drugs.   Physical Exam: BP 112/78   Pulse 92   Ht 5\' 4"  (1.626 m)   Wt 171 lb (77.6 kg)   BMI 29.35 kg/m   Constitutional:  Alert and oriented, No acute distress. HEENT: [place] AT Respiratory: Normal respiratory effort, no increased work of breathing. GI: Abdomen is soft, nontender, nondistended, no abdominal masses Psychiatric: Normal mood and affect.  Pertinent Imaging: KUB performed today, personally reviewed and interpreted. There are no calcifications identified overlying the renal outlines and expected course of the ureters.  Assessment & Plan:    1. Personal history urinary calculi 2 year follow-up with KUB. Instructed to call earlier for symptoms suspicious for recurrent stone  pain.   I have reviewed the above documentation for accuracy and completeness, and I agree with the above.   Riki Altes, MD  [facility] [address], [address] [place], [place] [address] [phone]

## 2023-01-17 ENCOUNTER — Encounter: Payer: Self-pay | Admitting: Urology

## 2023-01-20 ENCOUNTER — Other Ambulatory Visit (HOSPITAL_COMMUNITY): Payer: Self-pay

## 2023-01-23 ENCOUNTER — Telehealth: Payer: Self-pay

## 2023-01-23 ENCOUNTER — Other Ambulatory Visit (HOSPITAL_COMMUNITY): Payer: Self-pay

## 2023-01-23 NOTE — Telephone Encounter (Signed)
Pharmacy Patient Advocate Encounter- Botox BIV-Pharmacy Benefit:  PA was submitted to [employer] and has been approved through: 04/22/2023 Authorization# PA Case ID #: [id number]  Please send prescription to Specialty Pharmacy: [facility]: [phone]

## 2023-02-05 ENCOUNTER — Other Ambulatory Visit (HOSPITAL_COMMUNITY): Payer: Self-pay

## 2023-02-09 NOTE — Telephone Encounter (Signed)
ATC x2 LVM for patient to call our office back regarding sleep study result's.

## 2023-02-13 ENCOUNTER — Telehealth: Payer: Self-pay

## 2023-02-13 ENCOUNTER — Other Ambulatory Visit (HOSPITAL_COMMUNITY): Payer: Self-pay

## 2023-02-13 NOTE — Telephone Encounter (Signed)
Received a fax from [employer] stating that the patients PA for Ajovy will be expiring soon-Tried to start the PA but could not verify insurance information via my eligibility tracker-in Epic it states that Janet Gallegos has elapsed. I sent the PT a MyChart message asking about her insurance-asked her to scan a picture of the front and the back of her new card if it has changed-if not then I will outreach the insurance to try to verify.

## 2023-02-16 ENCOUNTER — Other Ambulatory Visit (HOSPITAL_COMMUNITY): Payer: Self-pay

## 2023-02-16 NOTE — Telephone Encounter (Signed)
Pharmacy Patient Advocate Encounter   Received notification from [employer] that prior authorization for AJOVY (fremanezumab-vfrm) injection 225MG /1.5ML auto-injectors is required/requested.   PA submitted to [facility] via CoverMyMeds Key or (Medicaid) confirmation # [id number] Status is pending

## 2023-02-16 NOTE — Telephone Encounter (Signed)
PT states insurance is still the same-called [facility] and obtained the pharmacy benefit information...  Bin: [id number] PCN: [id number] RX Group: [id number]  Member ID: [id number]

## 2023-02-17 ENCOUNTER — Other Ambulatory Visit (HOSPITAL_COMMUNITY): Payer: Self-pay

## 2023-02-17 NOTE — Telephone Encounter (Signed)
Pharmacy Patient Advocate Encounter  Prior Authorization for AJOVY (fremanezumab-vfrm) injection 225MG /1.5ML auto-injectors has been APPROVED by [employer] from 02/16/2023 to 02/16/2024.   PA # PA Case ID: [id number]  Copay is $796.81 for 1.5ML/30DS or $450.00 for 90DS/ 4.5ML per W:LOP test claim.

## 2023-03-02 ENCOUNTER — Encounter: Payer: Self-pay | Admitting: Family Medicine

## 2023-03-02 ENCOUNTER — Other Ambulatory Visit: Payer: Self-pay | Admitting: Neurology

## 2023-03-02 MED ORDER — METHYLPREDNISOLONE 4 MG PO TBPK: ORAL_TABLET | ORAL | 1 refills | Status: DC

## 2023-03-02 NOTE — Telephone Encounter (Signed)
Patient was given sleep study result's with Tammy P on 01/07/2023  Nothing else further needed.

## 2023-03-03 ENCOUNTER — Telehealth: Payer: Self-pay | Admitting: Neurology

## 2023-03-03 NOTE — Telephone Encounter (Addendum)
Order written and signed  by Dr Shea Stakes  for Migraine cocktail  order given to infusion  to call pt for appointment today .   Zofran 4mg   Depacon 1gm IV ml NS over 5 min Solu Medrol  125IV IN 100 ML NS over 15 minutes  Toradol 30 mg IVP undiluted over  at least 15 seconds

## 2023-03-03 NOTE — Telephone Encounter (Signed)
Can we give patient a migraine cocktail today in infusion please? She has mycharted many times.

## 2023-03-03 NOTE — Telephone Encounter (Signed)
See phone note

## 2023-03-24 NOTE — Telephone Encounter (Signed)
Sorry to hear that this did not work for you we can definitely discuss further options when you come in in August.  Take Care  Rubye Oaks NP

## 2023-03-24 NOTE — Telephone Encounter (Signed)
I have received the following mychart message from this patient   "I know that you are out of office, but I still wanted to send an update before I forgot.    I have tried several of the masks and none have worked, for various reasons: leaks at the bottom of all face masks, the nasal was impossible as when I relax the air cycles out my mouth (even when completely shut, kind of funny),  had to use the humidity but was terrified to yank the machine off the desk since I move around so much in my sleep (almost did one night) and water inside machine would ruin it, with the exception of the last mask the pressure was too high and was not comfortable to breath.    I stopped trying after this last mask failure and I'm going to return it. I'm not sure what to do from here, but we shall see in August! Thank you. "   Janet Gallegos please advise

## 2023-03-25 ENCOUNTER — Telehealth: Payer: Self-pay | Admitting: Family Medicine

## 2023-03-25 NOTE — Telephone Encounter (Signed)
Updated appt note

## 2023-03-25 NOTE — Telephone Encounter (Signed)
[facility] [phone] has called  has called to report delivery of Botox on tomorrow 100 units quantity 2 between the hours of 8:00-5:00 [facility] address and hours were confirmed

## 2023-03-26 NOTE — Telephone Encounter (Signed)
Received (1) 100 unit vials of Botox from [facility].

## 2023-04-01 ENCOUNTER — Encounter: Payer: Self-pay | Admitting: Family

## 2023-04-01 ENCOUNTER — Ambulatory Visit: Payer: Managed Care, Other (non HMO) | Admitting: Family

## 2023-04-01 ENCOUNTER — Ambulatory Visit (INDEPENDENT_AMBULATORY_CARE_PROVIDER_SITE_OTHER): Payer: Managed Care, Other (non HMO)

## 2023-04-01 VITALS — BP 118/70 | HR 93 | Temp 97.8°F | Ht 64.0 in | Wt 174.0 lb

## 2023-04-01 DIAGNOSIS — R42 Dizziness and giddiness: Secondary | ICD-10-CM

## 2023-04-01 DIAGNOSIS — R5383 Other fatigue: Secondary | ICD-10-CM

## 2023-04-01 NOTE — Progress Notes (Unsigned)
Assessment & Plan:  Dizziness Assessment & Plan: New, suboptimal control.  episodic presentation most consistent with vasovagal presyncope.  Only occurs on the weekends after prolonged crouching. Patient's blood pressure normally on low end of normal.  Discussed hydration, liberalizing salt.  Baseline EKG obtained , NSR.  No prior EKG to compare too. She is not orthostatic, see flowsheet.  Pending chest xray, echocardiogram to further evaluate for structural heart disease, particularly in the setting of longstanding fatigue. Question POTS?  Close follow up.  Addendum: CMA didn't put orthostatic vitals in the chart. I read them at time of visit off her paper however we will plan to repeat them again at follow up.   Orders: -     EKG 12-Lead -     EKG 12-Lead  Other fatigue Assessment & Plan: Chronic, suboptimal control . etiology unknown at this time.  I do suspect OSA is contributory, however unfortunately the mask has not fit well for the patient.  Pending CXR, echocardiogram as part of continued evaluation.   Orders: -     ECHOCARDIOGRAM COMPLETE; Future -     DG Chest 2 View -     EKG 12-Lead     Return precautions given.   Risks, benefits, and alternatives of the medications and treatment plan prescribed today were discussed, and patient expressed understanding.   Education regarding symptom management and diagnosis given to patient on AVS either electronically or printed.  Return in about 2 months (around 06/01/2023).  Rennie Plowman, FNP  Subjective:    Patient ID: Janet Gallegos, female    DOB: [birth year], 42 y.o.   MRN: [id number]  CC: Janet Gallegos is a 42 y.o. female who presents today for follow up.   HPI: Complains of continued fatigue, which is unchanged.   The Cipap was not working and she tried many masks but stopped wearing due to leaking.         She describes episodes of feeling lightheaded, started 6 weeks ago. 3-4 episodes.  Episodes  only occur on the weekend she has noticed along with her boyfriend that when in bending down , crouching while shopping,  when she starts to get up 'I feel like I will fall over'. No associated HA, fever, vision loss, hearing loss, syncopal episode, unusual weight loss, unusual bone pain, CP, SOB.   She drinks 32 ounces of water. Occassional caffeine. .   4 days of the week she does strength building exercise for one hour; fatigue is increased after excerise. No associated CP.   She does not eat much salt  She follows with neurology, Dr. Lucia Gaskins for migraine and Rubye Oaks, pulmonology  Sleep evaluation 12/16/22  36 apneic episodes   Diagnosed mild sleep apnea.  Mammogram is up-to-date Allergies: Sulfa antibiotics, Percocet [oxycodone-acetaminophen], Propranolol, and Topamax [topiramate] Current Outpatient Medications on File Prior to Visit  Medication Sig Dispense Refill   albuterol (VENTOLIN HFA) 108 (90 Base) MCG/ACT inhaler Inhale into the lungs.     AMZEEQ 4 % FOAM      botulinum toxin Type A (BOTOX) 100 units SOLR injection Provider to inject 155 units into the muscles of the head and neck every 3 months. Discard remainder 2 each 1   budesonide-formoterol (SYMBICORT) 160-4.5 MCG/ACT inhaler Inhale 2 puffs into the lungs 2 (two) times daily.     cetirizine (ZYRTEC) 10 MG tablet Take 10 mg by mouth daily as needed.     EPINEPHrine 0.3 mg/0.3 mL IJ SOAJ injection See  admin instructions.     Fremanezumab-vfrm (AJOVY) 225 MG/1.5ML SOAJ Inject 225 mg into the skin every 30 (thirty) days. 4.5 mL 3   GNP 24 HOUR NASAL ALLERGY 55 MCG/ACT AERO nasal inhaler SMARTSIG:Both Nares     hydroquinone 4 % cream Apply topically.     levonorgestrel (MIRENA) 20 MCG/24HR IUD 1 each by Intrauterine route once.     lisdexamfetamine (VYVANSE) 70 MG capsule Take 60 mg by mouth every morning.     NURTEC 75 MG TBDP Take 1 tablet by mouth daily as needed. 30 tablet 3   ondansetron (ZOFRAN) 4 MG tablet Take 1  tablet (4 mg total) by mouth every 8 (eight) hours as needed for nausea or vomiting. 20 tablet 0   Spacer/Aero-Holding Chambers (AEROCHAMBER MV) inhaler See admin instructions.     spironolactone (ALDACTONE) 50 MG tablet Take 50 mg by mouth 2 (two) times daily.     sucralfate (CARAFATE) 1 g tablet      No current facility-administered medications on file prior to visit.    Review of Systems  Constitutional:  Negative for chills, fever and unexpected weight change.  Eyes:  Negative for visual disturbance.  Respiratory:  Negative for cough.   Cardiovascular:  Negative for chest pain and palpitations.  Gastrointestinal:  Negative for nausea and vomiting.  Musculoskeletal:  Negative for arthralgias.  Neurological:  Positive for dizziness. Negative for syncope and headaches.      Objective:    BP 118/70   Pulse 93   Temp 97.8 F (36.6 C)   Ht 5\' 4"  (1.626 m)   Wt 174 lb (78.9 kg)   SpO2 97%   BMI 29.87 kg/m  BP Readings from Last 3 Encounters:  04/01/23 118/70  01/16/23 112/78  01/14/23 122/81   Wt Readings from Last 3 Encounters:  04/01/23 174 lb (78.9 kg)  01/16/23 171 lb (77.6 kg)  01/14/23 171 lb 3.2 oz (77.7 kg)   No data found.   Physical Exam Vitals reviewed.  Constitutional:      Appearance: She is well-developed.  Eyes:     Conjunctiva/sclera: Conjunctivae normal.  Neck:     Vascular: No carotid bruit.  Cardiovascular:     Rate and Rhythm: Normal rate and regular rhythm.     Pulses: Normal pulses.     Heart sounds: Normal heart sounds.  Pulmonary:     Effort: Pulmonary effort is normal.     Breath sounds: Normal breath sounds. No wheezing, rhonchi or rales.  Skin:    General: Skin is warm and dry.  Neurological:     Mental Status: She is alert.  Psychiatric:        Speech: Speech normal.        Behavior: Behavior normal.        Thought Content: Thought content normal.   No data found.   No data found.

## 2023-04-01 NOTE — Patient Instructions (Signed)
As discussed, I would advise that you add more salt to your diet.  Please be very careful position changes.  Please monitor for continued episodes of feeling lightheaded.  Certainly if you experience palpitations or chest pain, I would like you to go to emergency room  I ordered an echocardiogram Let us know if you dont hear back within a week in regards to an appointment being scheduled.   So that you are aware, if you are [facility] MyChart user , please pay attention to your MyChart messages as you may receive a MyChart message with a phone number to call and schedule this test/appointment own your own from our referral coordinator. This is a new process so I do not want you to miss this message.  If you are not a MyChart user, you will receive a phone call.

## 2023-04-01 NOTE — Assessment & Plan Note (Addendum)
New, suboptimal control.  episodic presentation most consistent with vasovagal presyncope.  Only occurs on the weekends after prolonged crouching. Patient's blood pressure normally on low end of normal.  Discussed hydration, liberalizing salt.  Baseline EKG obtained , NSR.  No prior EKG to compare too. She is not orthostatic, see flowsheet.  Pending chest xray, echocardiogram to further evaluate for structural heart disease, particularly in the setting of longstanding fatigue. Question POTS?  Close follow up.  Addendum: CMA didn't put orthostatic vitals in the chart. I read them at time of visit off her paper however we will plan to repeat them again at follow up.

## 2023-04-07 NOTE — Assessment & Plan Note (Addendum)
Chronic, suboptimal control . etiology unknown at this time.  I do suspect OSA is contributory, however unfortunately the mask has not fit well for the patient.  Pending CXR, echocardiogram as part of continued evaluation.

## 2023-04-08 ENCOUNTER — Telehealth: Payer: Self-pay

## 2023-04-09 IMAGING — CR DG ABDOMEN 1V
1 series · 3 of 3 positions shown · non-contrast
Comparison: Renal stone protocol CT 11/02/2020

CLINICAL DATA: Nephrolithiasis

EXAM:
ABDOMEN - 1 VIEW

[Series 1: dg abd 1 view · 0.14mm/px · 3 of 3 slices shown]
[im 1/3]
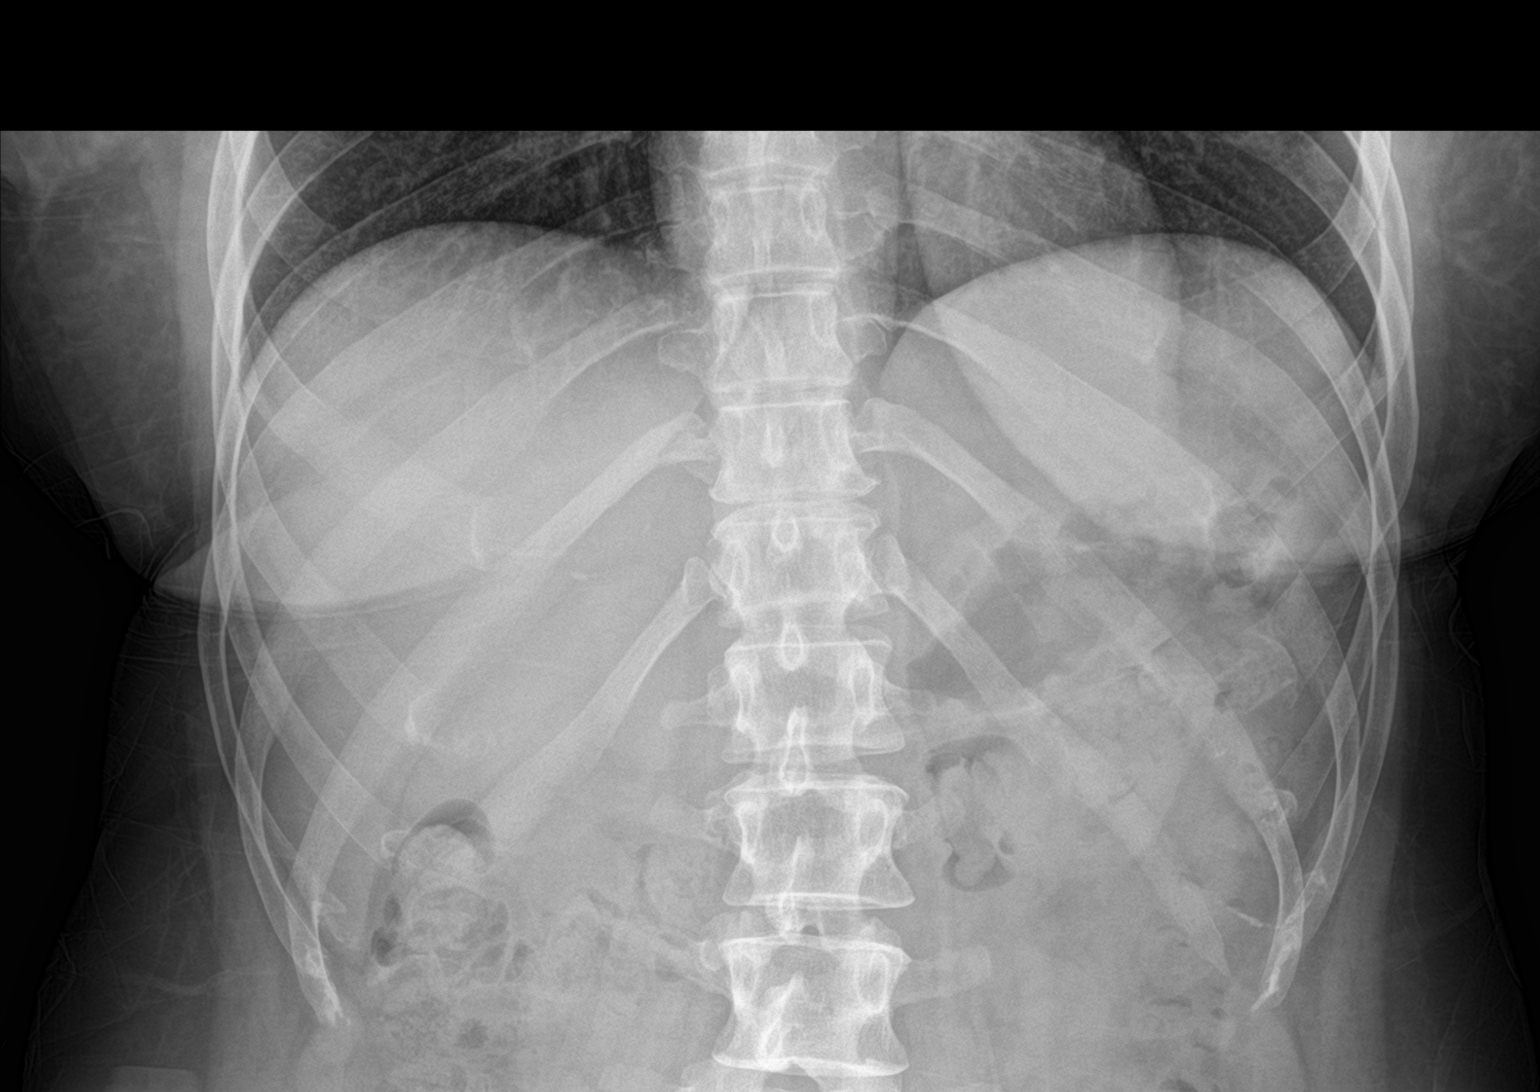
[im 2/3]
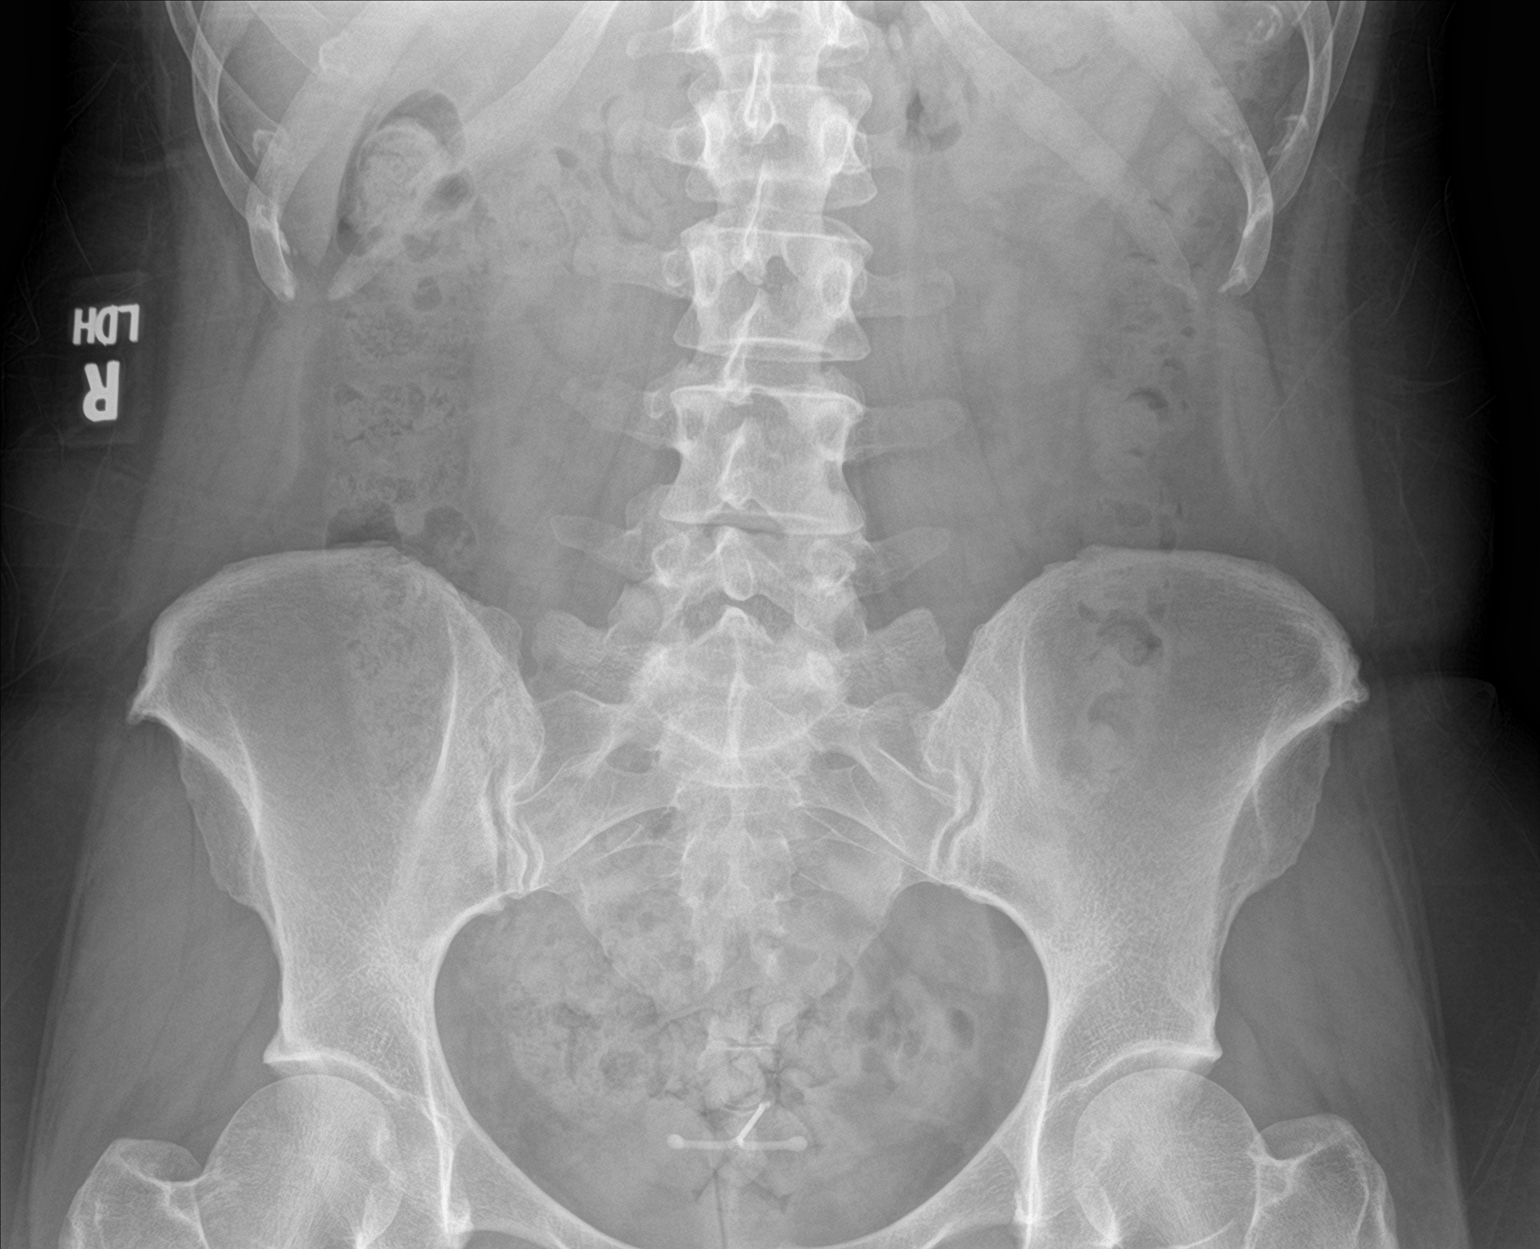
[im 3/3]
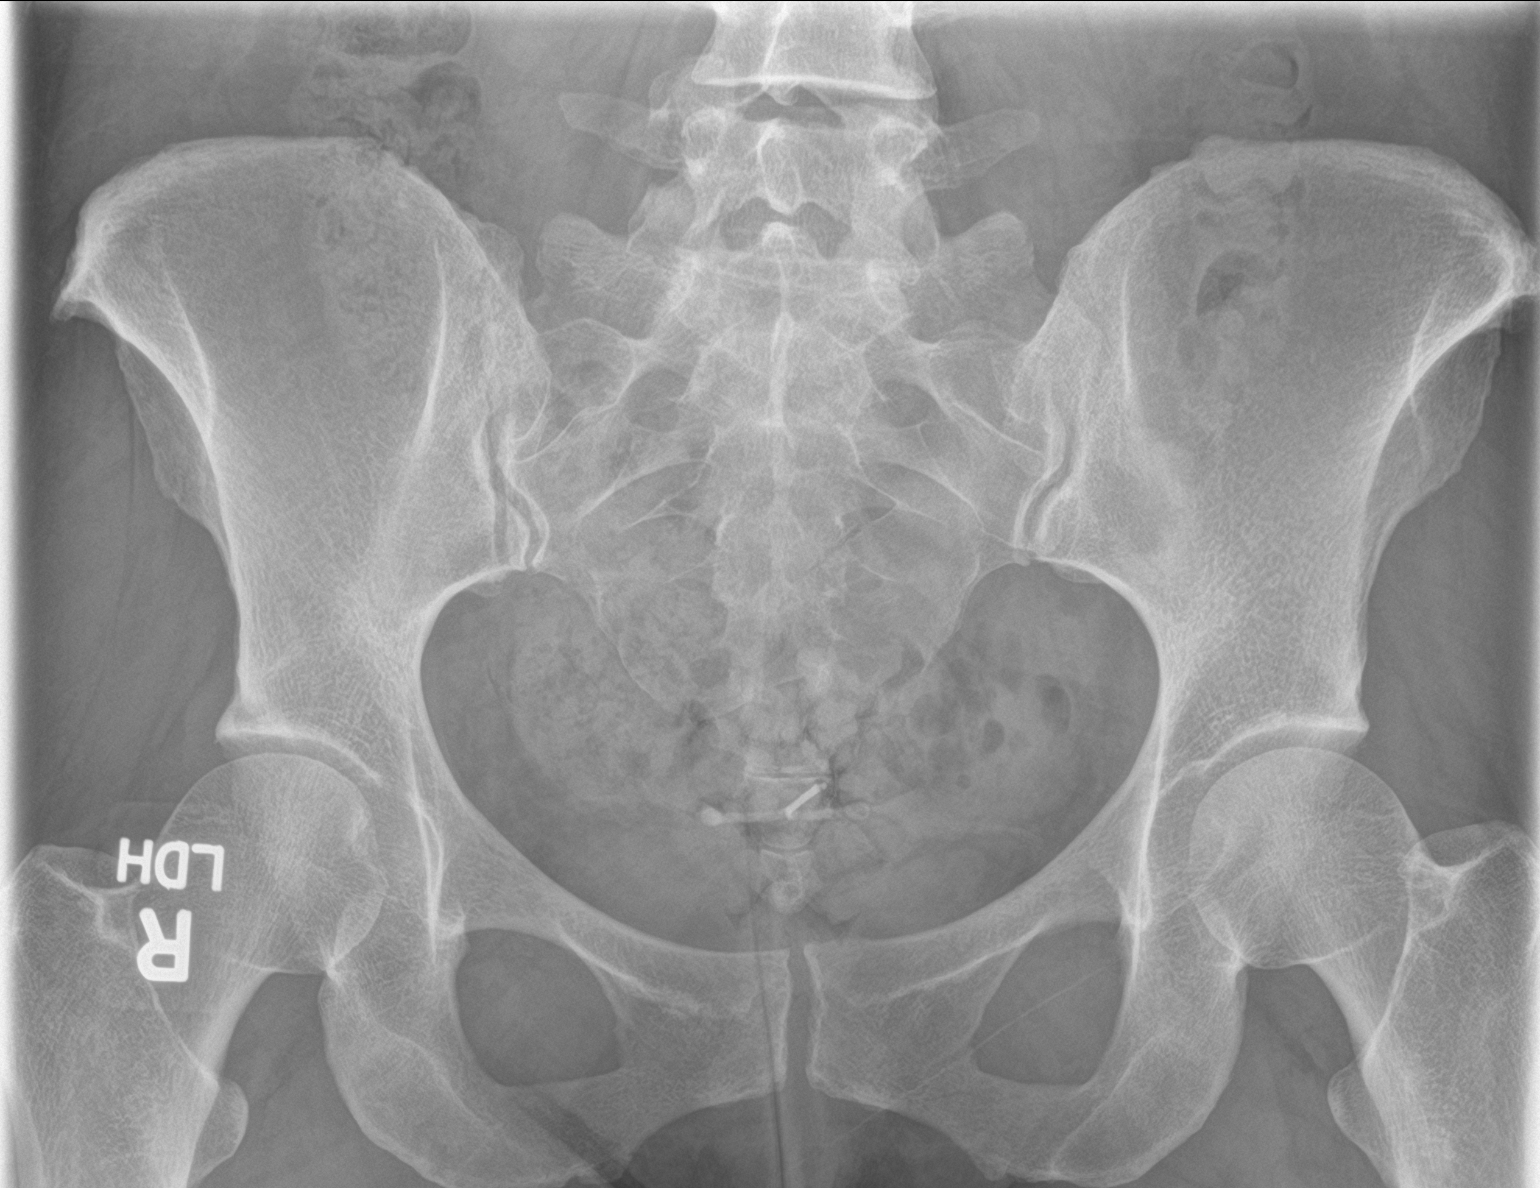

[3 of 3 positions shown; findings below may reference images not displayed]

FINDINGS: The bowel gas pattern is normal. No radio-opaque calculi or other
significant radiographic abnormality are seen. IUD noted within the
central pelvis.
IMPRESSION: No radiopaque renal or ureteral calculus. Previously seen left
ureterovesicular junction calculus is not identified on current
exam.

## 2023-04-13 NOTE — Progress Notes (Unsigned)
04/14/2023 ALL: She returns for Botox. Migraines have been more frequent and intense, recently. She received IV cocktail 01/2023.   01/14/2023 ALL: She returns for Botox. She is doing well. She continues Ajovy and Nurtec. Migraines are usually well managed until last two weeks before Botox. Nurtec works well.   10/21/2022 ALL: Janet Gallegos returns for Botox. She continues Ajovy and Nurtec. Migraines are well managed.   07/29/2022 ALL: Janet Gallegos returns for Botox. She continues Ajovy and Nurtec. She is doing well. Migraines have settled down.   05/06/2022 ALL: She returns for Botox. She is doing well. She did have a breakthrough migraine in the setting of abrupt discontinuation of Vyvanse and travel to [place]. She is using Nurtec as needed that does help.   01/23/2022 ALL: She returns for Botox. She continues Ajovy and Nurtec. She reports doing very well. She has only had one migraine since last visit.   10/31/2021 ALL: She returns for Botox. Ajovy seems to be helping. Migraines are less frequent and less severe. Nurtec helps with abortive therapy. She is feeling well, today   08/06/2021 ALL: She continues Botox and Nurtec. We started Ajovy about 2 weeks ago after worsening and prolonged migraines. She has not picked up from pharmacy yet. She was encouraged to take Nurtec with Tylenol, ibuprofen and Benadryl. She is doing a little better but continues to have regular headache days, especially with storms.   05/02/2021 ALL: She continues Botox and Nurtec. She is doing well. She did have an intractable migraine about 2 weeks ago. Decadron on back order and medrol dose pack was given. It did help.   01/30/2021 ALL: She continues to do well with Botox therapy. Nurtec works well for abortive therapy. She will occasionally use rizatriptan if Nurtec not effective. She has had 1 migraine this month after being out of Vyvanse for a few days.   10/25/2020 ALL: She is doing well. Botox continues to be effective in migraine  management. Nurtec helps with abortive therapy.   07/24/2020 ALL: She is doing well. Migraines are well managed. She has received Nurtec and it is working well. She was recently started on Vyvanse for ADHD. No worsening in migraines thus far.   Consent Form Botulism Toxin Injection For Chronic Migraine    Reviewed orally with patient, additionally signature is on file:  Botulism toxin has been approved by the Federal drug administration for treatment of chronic migraine. Botulism toxin does not cure chronic migraine and it may not be effective in some patients.  The administration of botulism toxin is accomplished by injecting a small amount of toxin into the muscles of the neck and head. Dosage must be titrated for each individual. Any benefits resulting from botulism toxin tend to wear off after 3 months with a repeat injection required if benefit is to be maintained. Injections are usually done every 3-4 months with maximum effect peak achieved by about 2 or 3 weeks. Botulism toxin is expensive and you should be sure of what costs you will incur resulting from the injection.  The side effects of botulism toxin use for chronic migraine may include:   -Transient, and usually mild, facial weakness with facial injections  -Transient, and usually mild, head or neck weakness with head/neck injections  -Reduction or loss of forehead facial animation due to forehead muscle weakness  -Eyelid drooping  -Dry eye  -Pain at the site of injection or bruising at the site of injection  -Double vision  -Potential unknown long term risks  Contraindications: You should not have Botox if you are pregnant, nursing, allergic to albumin, have an infection, skin condition, or muscle weakness at the site of the injection, or have myasthenia gravis, Lambert-Eaton syndrome, or ALS.  It is also possible that as with any injection, there may be an allergic reaction or no effect from the medication. Reduced  effectiveness after repeated injections is sometimes seen and rarely infection at the injection site may occur. All care will be taken to prevent these side effects. If therapy is given over a long time, atrophy and wasting in the muscle injected may occur. Occasionally the patient's become refractory to treatment because they develop antibodies to the toxin. In this event, therapy needs to be modified.  I have read the above information and consent to the administration of botulism toxin.   BOTOX PROCEDURE NOTE FOR MIGRAINE HEADACHE  Contraindications and precautions discussed with patient(above). Aseptic procedure was observed and patient tolerated procedure. Procedure performed by Shawnie Dapper, FNP-C.   The condition has existed for more than 6 months, and pt does not have a diagnosis of ALS, Myasthenia Gravis or Lambert-Eaton Syndrome.  Risks and benefits of injections discussed and pt agrees to proceed with the procedure.  Written consent obtained  These injections are medically necessary. Pt  receives good benefits from these injections. These injections do not cause sedations or hallucinations which the oral therapies may cause.   Description of procedure:  The patient was placed in a sitting position. The standard protocol was used for Botox as follows, with 5 units of Botox injected at each site:  -Procerus muscle, midline injection  -Corrugator muscle, bilateral injection  -Frontalis muscle, bilateral injection, with 2 sites each side, medial injection was performed in the upper one third of the frontalis muscle, in the region vertical from the medial inferior edge of the superior orbital rim. The lateral injection was again in the upper one third of the forehead vertically above the lateral limbus of the cornea, 1.5 cm lateral to the medial injection site.  -Temporalis muscle injection, 4 sites, bilaterally. The first injection was 3 cm above the tragus of the ear, second injection  site was 1.5 cm to 3 cm up from the first injection site in line with the tragus of the ear. The third injection site was 1.5-3 cm forward between the first 2 injection sites. The fourth injection site was 1.5 cm posterior to the second injection site. 5th site laterally in the temporalis  muscleat the level of the outer canthus.  -Occipitalis muscle injection, 3 sites, bilaterally. The first injection was done one half way between the occipital protuberance and the tip of the mastoid process behind the ear. The second injection site was done lateral and superior to the first, 1 fingerbreadth from the first injection. The third injection site was 1 fingerbreadth superiorly and medially from the first injection site.  -Cervical paraspinal muscle injection, 2 sites, bilaterally. The first injection site was 1 cm from the midline of the cervical spine, 3 cm inferior to the lower border of the occipital protuberance. The second injection site was 1.5 cm superiorly and laterally to the first injection site.  -Trapezius muscle injection was performed at 3 sites, bilaterally. The first injection site was in the upper trapezius muscle halfway between the inflection point of the neck, and the acromion. The second injection site was one half way between the acromion and the first injection site. The third injection was done between the first injection site  and the inflection point of the neck.   Will return for repeat injection in 3 months.   A total of 200 units of Botox was prepared, 155 units of Botox was injected as documented above, any Botox not injected was wasted. The patient tolerated the procedure well, there were no complications of the above procedure.

## 2023-04-14 ENCOUNTER — Other Ambulatory Visit (HOSPITAL_COMMUNITY): Payer: Self-pay

## 2023-04-14 ENCOUNTER — Encounter: Payer: Self-pay | Admitting: Adult Health

## 2023-04-14 ENCOUNTER — Ambulatory Visit: Payer: Managed Care, Other (non HMO) | Admitting: Adult Health

## 2023-04-14 ENCOUNTER — Ambulatory Visit (INDEPENDENT_AMBULATORY_CARE_PROVIDER_SITE_OTHER): Payer: Managed Care, Other (non HMO) | Admitting: Family Medicine

## 2023-04-14 VITALS — BP 99/80 | HR 89 | Ht 64.0 in | Wt 173.0 lb

## 2023-04-14 DIAGNOSIS — G4733 Obstructive sleep apnea (adult) (pediatric): Secondary | ICD-10-CM | POA: Diagnosis not present

## 2023-04-14 DIAGNOSIS — G43019 Migraine without aura, intractable, without status migrainosus: Secondary | ICD-10-CM | POA: Diagnosis not present

## 2023-04-14 MED ORDER — ONABOTULINUMTOXINA 200 UNITS IJ SOLR
155.0000 [IU] | Freq: Once | INTRAMUSCULAR | Status: AC
Start: 2023-04-14 — End: 2023-04-14
  Administered 2023-04-14: 155 [IU] via INTRAMUSCULAR

## 2023-04-14 NOTE — Progress Notes (Signed)
Botox- 100 units x 2 vials Lot: [id number] Expiration:12/2024 NDC: 6578-4696-29  Bacteriostatic 0.9% Sodium Chloride- 2 mL  Lot: [id number]  Expiration: 07/02/2024  NDC: 2440-1027-25   Dx: G43.019 S/P: Witnessed DG:[id number] Basilio [place], CMA

## 2023-04-14 NOTE — Assessment & Plan Note (Signed)
Mild obstructive sleep apnea patient was unable to tolerate CPAP therapy.  She gave a very good effort but was unable to tolerate tried several different mask without tolerance.  I have advised patient to work on weight loss.  We discussed positional sleep avoiding sleeping on her back.  Has suggested a referral to orthodontics for oral appliance evaluation.  At this time she wants to hold off I will call back if she changes her mind  Plan  . Patient Instructions  Call back if you want a referral to orthodontics for oral appliance.  Work on healthy sleep regimen  Avoid sleeping on your back.  Work on healthy weight loss.  Do not drive if sleepy  Follow up with our office As needed

## 2023-04-14 NOTE — Progress Notes (Signed)
@Patient  ID: Janet Gallegos, female    DOB: Jul 01, 1981, 42 y.o.   MRN: [id number]  Chief Complaint  Patient presents with   Follow-up    Referring provider: Allegra Grana, FNP  HPI: 42 year old female seen for sleep consult November 26, 2022 for restless sleep, snoring and daytime sleepiness found to have moderate obstructive sleep apnea  TEST/EVENTS :  home sleep study that was completed on December 16, 2022 this showed mild obstructive sleep apnea with AHI at 8.1/hour and SpO2 low at 88%.   04/14/2023 Follow up: OSA  Patient returns for a 76-month follow-up.  Patient was seen last visit after recent sleep study done December 16, 2022 that showed mild sleep apnea.  She had had some restless sleep and snoring.  Patient was started on CPAP therapy.  Patient says she tried CPAP multiple times.  She tried 5 different CPAP mask but was unable to tolerate and turned her machine back into the DME company.  We discussed alternative options for treatment of sleep apnea.  Including weight loss, positional sleep and oral appliance.  Patient says she is going to work on weight loss and trying to avoid sleeping on her back.  She will speak to her dentist about an oral appliance but at this time does not want a referral.   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   Kidney stone    Migraines     Tobacco History: Social History   Tobacco Use  Smoking Status Former   Current packs/day: 0.00   Average packs/day: 0.5 packs/day for 4.0 years (2.0 ttl pk-yrs)   Types: Cigarettes   Start date: 01/11/2011   Quit date: 01/11/2015   Years since quitting: 8.2   Passive exposure: Past  Smokeless Tobacco Never  Tobacco Comments       Counseling given: Not Answered Tobacco comments:     Outpatient Medications Prior to Visit  Medication Sig Dispense Refill   albuterol (VENTOLIN HFA) 108 (90 Base) MCG/ACT inhaler Inhale into the lungs.     AMZEEQ 4 % FOAM      botulinum toxin Type A (BOTOX) 100 units SOLR injection Provider to inject 155 units into the muscles of the head and neck every 3 months. Discard remainder 2 each 1   budesonide-formoterol (SYMBICORT) 160-4.5 MCG/ACT inhaler Inhale 2 puffs into the lungs 2 (two) times daily.     cetirizine (ZYRTEC) 10 MG tablet Take 10 mg by mouth daily as needed.  EPINEPHrine 0.3 mg/0.3 mL IJ SOAJ injection See admin instructions.     Fremanezumab-vfrm (AJOVY) 225 MG/1.5ML SOAJ Inject 225 mg into the skin every 30 (thirty) days. 4.5 mL 3   GNP 24 HOUR NASAL ALLERGY 55 MCG/ACT AERO nasal inhaler SMARTSIG:Both Nares     hydroquinone 4 % cream Apply topically.     levonorgestrel (MIRENA) 20 MCG/24HR IUD 1 each by Intrauterine route once.     lisdexamfetamine (VYVANSE) 70 MG capsule Take 60 mg by mouth every morning.     NURTEC 75 MG TBDP Take 1 tablet by mouth daily as needed. 30 tablet 3   ondansetron (ZOFRAN) 4 MG tablet Take 1 tablet (4 mg total) by mouth every 8 (eight) hours as needed for nausea or vomiting. 20 tablet 0   Spacer/Aero-Holding Chambers  (AEROCHAMBER MV) inhaler See admin instructions.     spironolactone (ALDACTONE) 50 MG tablet Take 50 mg by mouth 2 (two) times daily.     sucralfate (CARAFATE) 1 g tablet      No facility-administered medications prior to visit.     Review of Systems:   Constitutional:   No  weight loss, night sweats,  Fevers, chills,  +fatigue, or  lassitude.  HEENT:   No headaches,  Difficulty swallowing,  Tooth/dental problems, or  Sore throat,                No sneezing, itching, ear ache, nasal congestion, post nasal drip,   CV:  No chest pain,  Orthopnea, PND, swelling in lower extremities, anasarca, dizziness, palpitations, syncope.   GI  No heartburn, indigestion, abdominal pain, nausea, vomiting, diarrhea, change in bowel habits, loss of appetite, bloody stools.   Resp: No shortness of breath with exertion or at rest.  No excess mucus, no productive cough,  No non-productive cough,  No coughing up of blood.  No change in color of mucus.  No wheezing.  No chest wall deformity  Skin: no rash or lesions.  GU: no dysuria, change in color of urine, no urgency or frequency.  No flank pain, no hematuria   MS:  No joint pain or swelling.  No decreased range of motion.  No back pain.    Physical Exam  BP 99/80 (BP Location: Left Arm, Patient Position: Sitting, Cuff Size: Normal)   Pulse 89   Ht 5\' 4"  (1.626 m)   Wt 173 lb (78.5 kg)   SpO2 100%   BMI 29.70 kg/m   GEN: A/Ox3; pleasant , NAD, well nourished    HEENT:  [place]/AT,  NOSE-clear, THROAT-clear, no lesions, no postnasal drip or exudate noted.  Class II-III MP airway  NECK:  Supple w/ fair ROM; no JVD; normal carotid impulses w/o bruits; no thyromegaly or nodules palpated; no lymphadenopathy.    RESP  Clear  P & A; w/o, wheezes/ rales/ or rhonchi. no accessory muscle use, no dullness to percussion  CARD:  RRR, no m/r/g, no peripheral edema, pulses intact, no cyanosis or clubbing.  GI:   Soft & nt; nml bowel sounds; no organomegaly  or masses detected.   Musco: Warm bil, no deformities or joint swelling noted.   Neuro: alert, no focal deficits noted.    Skin: Warm, no lesions or rashes    Lab Results:   BNP No results found for: "BNP"  ProBNP No results found for: "PROBNP"  Imaging:   botulinum toxin Type A (BOTOX) injection 155 Units     Date Action Dose Route User   04/14/2023  1539 Given 155 Units Intramuscular (Other) Lomax, Amy, NP           No data to display          No results found for: "NITRICOXIDE"      Assessment & Plan:   OSA (obstructive sleep apnea) Mild obstructive sleep apnea patient was unable to tolerate CPAP therapy.  She gave a very good effort but was unable to tolerate tried several different mask without tolerance.  I have advised patient to work on weight loss.  We discussed positional sleep avoiding sleeping on her back.  Has suggested a referral to orthodontics for oral appliance evaluation.  At this time she wants to hold off I will call back if she changes her mind  Plan  . Patient Instructions  Call back if you want a referral to orthodontics for oral appliance.  Work on healthy sleep regimen  Avoid sleeping on your back.  Work on healthy weight loss.  Do not drive if sleepy  Follow up with our office As needed        Rubye Oaks, NP 04/14/2023

## 2023-04-14 NOTE — Patient Instructions (Addendum)
Call back if you want a referral to orthodontics for oral appliance.  Work on healthy sleep regimen  Avoid sleeping on your back.  Work on healthy weight loss.  Do not drive if sleepy  Follow up with our office As needed

## 2023-04-15 NOTE — Progress Notes (Signed)
Reviewed and agree with assessment/plan.   Coralyn Helling, MD [facility] Pulmonary/Critical Care 04/15/2023, 8:17 AM Pager:  [phone]

## 2023-04-15 NOTE — Telephone Encounter (Signed)
Submitted renewal via cmm, status is pending. Key: [id number]

## 2023-04-15 NOTE — Telephone Encounter (Signed)
Received approval: [id number] (04/15/23-07/16/23).

## 2023-04-16 ENCOUNTER — Encounter (INDEPENDENT_AMBULATORY_CARE_PROVIDER_SITE_OTHER): Payer: Self-pay

## 2023-05-15 ENCOUNTER — Ambulatory Visit
Admission: RE | Admit: 2023-05-15 | Discharge: 2023-05-15 | Disposition: A | Discharge status: Home / Self Care | Payer: Managed Care, Other (non HMO) | Source: Ambulatory Visit | Attending: Family

## 2023-05-15 DIAGNOSIS — I517 Cardiomegaly: Secondary | ICD-10-CM | POA: Diagnosis not present

## 2023-05-15 DIAGNOSIS — R5383 Other fatigue: Secondary | ICD-10-CM | POA: Insufficient documentation

## 2023-05-15 LAB — ECHOCARDIOGRAM COMPLETE
AR max vel: 2.76 cm2
AV Area VTI: 3.44 cm2
AV Area mean vel: 2.45 cm2
AV Mean grad: 3 mmHg
AV Peak grad: 4.8 mmHg
Ao pk vel: 1.1 m/s
Area-P 1/2: 4.15 cm2
MV VTI: 2.53 cm2
S' Lateral: 2.5 cm

## 2023-05-20 ENCOUNTER — Telehealth: Payer: Self-pay

## 2023-05-20 ENCOUNTER — Encounter: Payer: Self-pay | Admitting: Family Medicine

## 2023-05-20 NOTE — Telephone Encounter (Addendum)
Called pt and told her that I had asked the infusion suite if we get it approved can pt come in today for IV Steroid infusion. Infusion suite stated that if pt can come in before 3 pm they can give her a migraine cocktail.

## 2023-05-27 ENCOUNTER — Other Ambulatory Visit (HOSPITAL_COMMUNITY): Payer: Self-pay

## 2023-06-01 ENCOUNTER — Encounter: Payer: Self-pay | Admitting: Family

## 2023-06-01 ENCOUNTER — Ambulatory Visit (INDEPENDENT_AMBULATORY_CARE_PROVIDER_SITE_OTHER): Payer: Managed Care, Other (non HMO) | Admitting: Family

## 2023-06-01 VITALS — BP 130/70 | HR 77 | Temp 98.1°F | Ht 64.0 in | Wt 176.4 lb

## 2023-06-01 DIAGNOSIS — R42 Dizziness and giddiness: Secondary | ICD-10-CM | POA: Diagnosis not present

## 2023-06-01 DIAGNOSIS — R5383 Other fatigue: Secondary | ICD-10-CM

## 2023-06-01 DIAGNOSIS — Z23 Encounter for immunization: Secondary | ICD-10-CM | POA: Diagnosis not present

## 2023-06-01 NOTE — Progress Notes (Signed)
Assessment & Plan:  Need for influenza vaccination -     Flu vaccine trivalent PF, 6mos and older(Flulaval,Afluria,Fluarix,Fluzone)  Dizziness Assessment & Plan: Improved.  Episodes are less frequent.  Orthostatics repeated today;patient was not orthostatic exam. Will monitor.    Other fatigue Assessment & Plan: Unchanged. Discussed lifestyle management in regards to weight loss, gradual increase physical activity to address fatigue, mild OSA.  Patient will speak with her dentist regarding oral appliance. Printed information regarding low glycemic diet. We discussed nutrition consult and referral to [facility]; she may consider these options in the future. Will follow.       Return precautions given.   Risks, benefits, and alternatives of the medications and treatment plan prescribed today were discussed, and patient expressed understanding.   Education regarding symptom management and diagnosis given to patient on AVS either electronically or printed.  Return in about 6 months (around 11/29/2023).  Rennie Plowman, FNP  Subjective:    Patient ID: Janet Gallegos, female    DOB: 1980-12-27, 42 y.o.   MRN: [id number]  CC: Janet Gallegos is a 42 y.o. female who presents today for follow up.   HPI: Fatigue is unchanged.  She plays with dogs combined 1 hour per day.  She does stretching as well during the day.  Exercise limited.   Denies associated CP, syncope, SOB.   She has low sugar meal replacement or oatmeal with coffee.  She drinks water throughout the day.   Dinner at 9:30 pm and she may eat baked sweet potatoes , pasta, or green beans.   Episodes of lightheadedness have decreased. Episodes had been r/t changing position.   Consult with pulmonology 04/14/23, dx mild OSA Unable to tolerate Cipap mask. Discussed oral appliance by dentist  Echocardiogram 05/15/23 LVEF 55 to 60%.  Mild left ventricular hypertrophy. CXR 04/01/23 no acute  findings Allergies: Sulfa antibiotics, Percocet [oxycodone-acetaminophen], Propranolol, and Topamax [topiramate] Current Outpatient Medications on File Prior to Visit  Medication Sig Dispense Refill   albuterol (VENTOLIN HFA) 108 (90 Base) MCG/ACT inhaler Inhale into the lungs.     AMZEEQ 4 % FOAM      botulinum toxin Type A (BOTOX) 100 units SOLR injection Provider to inject 155 units into the muscles of the head and neck every 3 months. Discard remainder 2 each 1   budesonide-formoterol (SYMBICORT) 160-4.5 MCG/ACT inhaler Inhale 2 puffs into the lungs 2 (two) times daily.     cetirizine (ZYRTEC) 10 MG tablet Take 10 mg by mouth daily as needed.     EPINEPHrine 0.3 mg/0.3 mL IJ SOAJ injection See admin instructions.     Fremanezumab-vfrm (AJOVY) 225 MG/1.5ML SOAJ Inject 225 mg into the skin every 30 (thirty) days. 4.5 mL 3   GNP 24 HOUR NASAL ALLERGY 55 MCG/ACT AERO nasal inhaler SMARTSIG:Both Nares     hydroquinone 4 % cream Apply topically.     levonorgestrel (MIRENA) 20 MCG/24HR IUD 1 each by Intrauterine route once.     lisdexamfetamine (VYVANSE) 70 MG capsule Take 60 mg by mouth every morning.     NURTEC 75 MG TBDP Take 1 tablet by mouth daily as needed. 30 tablet 3   ondansetron (ZOFRAN) 4 MG tablet Take 1 tablet (4 mg total) by mouth every 8 (eight) hours as needed for nausea or vomiting. 20 tablet 0   Spacer/Aero-Holding Chambers (AEROCHAMBER MV) inhaler See admin instructions.     spironolactone (ALDACTONE) 50 MG tablet Take 50 mg by mouth 2 (two)  times daily.     sucralfate (CARAFATE) 1 g tablet      No current facility-administered medications on file prior to visit.    Review of Systems  Constitutional:  Positive for fatigue. Negative for chills, fever and unexpected weight change.  Respiratory:  Negative for cough.   Cardiovascular:  Negative for chest pain and palpitations.  Gastrointestinal:  Negative for nausea and vomiting.      Objective:    BP 130/70   Pulse 77    Temp 98.1 F (36.7 C) (Oral)   Ht 5\' 4"  (1.626 m)   Wt 176 lb 6.4 oz (80 kg)   SpO2 99%   BMI 30.28 kg/m  BP Readings from Last 3 Encounters:  06/01/23 130/70  04/14/23 99/80  04/01/23 118/70   Wt Readings from Last 3 Encounters:  06/01/23 176 lb 6.4 oz (80 kg)  04/14/23 173 lb (78.5 kg)  04/01/23 174 lb (78.9 kg)  Orthostatic VS for the past 24 hrs (Last 3 readings):  BP- Lying Pulse- Lying BP- Sitting Pulse- Sitting BP- Standing at 0 minutes Pulse- Standing at 0 minutes  06/01/23 0900 128/82 92 132/78 83 130/76 77     Physical Exam Vitals reviewed.  Constitutional:      Appearance: She is well-developed.  Eyes:     Conjunctiva/sclera: Conjunctivae normal.  Cardiovascular:     Rate and Rhythm: Normal rate and regular rhythm.     Pulses: Normal pulses.     Heart sounds: Normal heart sounds.  Pulmonary:     Effort: Pulmonary effort is normal.     Breath sounds: Normal breath sounds. No wheezing, rhonchi or rales.  Skin:    General: Skin is warm and dry.  Neurological:     Mental Status: She is alert.  Psychiatric:        Speech: Speech normal.        Behavior: Behavior normal.        Thought Content: Thought content normal.

## 2023-06-01 NOTE — Patient Instructions (Signed)
Please download Myfitness Pal App ( basic version is free).   You may log every thing you eat for even 2-3 days to get a better of idea of total daily calories. To loose weight, we have to create caloric deficit to loose weight. The goal is 1-2 lbs per week of weight loss.   Excellent article below from [facility].   [email or web address].it  Calorie counting made easy  Eat less, exercise more. If only it were that simple! As most dieters know, losing weight can be very challenging. As this report details, a range of influences can affect how people gain and lose weight. But a basic understanding of how to tip your energy balance in favor of weight loss is a good place to start.  Start by determining how many calories you should consume each day. To do so, you need to know how many calories you need to maintain your current weight. Doing this requires a few simple calculations.  First, multiply your current weight by 15 -- that's roughly the number of calories per pound of body weight needed to maintain your current weight if you are moderately active. Moderately active means getting at least 30 minutes of physical activity a day in the form of exercise (walking at a brisk pace, climbing stairs, or active gardening). Let's say you're a woman who is 5 feet, 4 inches tall and weighs 155 pounds, and you need to lose about 15 pounds to put you in a healthy weight range. If you multiply 155 by 15, you will get 2,325, which is the number of calories per day that you need in order to maintain your current weight (weight-maintenance calories). To lose weight, you will need to get below that total.  For example, to lose 1 to 2 pounds a week -- a rate that experts consider safe -- your food consumption should provide 500 to 1,000 calories less than your total weight-maintenance calories. If you need 2,325 calories a day to maintain your current weight,  reduce your daily calories to between 1,325 and 1,825. If you are sedentary, you will also need to build more activity into your day. In order to lose at least a pound a week, try to do at least 30 minutes of physical activity on most days, and reduce your daily calorie intake by at least 500 calories. However, calorie intake should not fall below 1,200 a day in women or 1,500 a day in men, except under the supervision of a health professional. Eating too few calories can endanger your health by depriving you of needed nutrients.  Meeting your calorie target How can you meet your daily calorie target? One approach is to add up the number of calories per serving of all the foods that you eat, and then plan your menus accordingly. You can buy books that list calories per serving for many foods. In addition, the nutrition labels on all packaged foods and beverages provide calories per serving information. Make a point of reading the labels of the foods and drinks you use, noting the number of calories and the serving sizes. Many recipes published in cookbooks, newspapers, and magazines provide similar information.  If you hate counting calories, a different approach is to restrict how much and how often you eat, and to eat meals that are low in calories. Dietary guidelines issued by the American Heart Association stress common sense in choosing your foods rather than focusing strictly on numbers, such as total calories or calories from fat.  Whichever method you choose, research shows that a regular eating schedule -- with meals and snacks planned for certain times each day -- makes for the most successful approach. The same applies after you have lost weight and want to keep it off. Sticking with an eating schedule increases your chance of maintaining your new weight.    This is  Dr. Melina Schools  ( an amazing physician in my office!)  example of a  "Low GI"  Diet:  It will allow you to lose 4 to 8  lbs  per month  if you follow it carefully.  Your goal with exercise is a minimum of 30 minutes of aerobic exercise 5 days per week (Walking does not count once it becomes easy!)    All of the foods can be found at grocery stores and in bulk at [employer].  The Atkins protein bars and shakes are available in more varieties at Target, [employer] and [employer].     7 AM Breakfast:  Choose from the following:  Low carbohydrate Protein  Shakes (I recommend the  Premier Protein chocolate shakes,  EAS AdvantEdge "Carb Control" shakes  Or the Atkins shakes all are under 3 net carbs)     a scrambled egg/bacon/cheese burrito made with Mission's "carb balance" whole wheat tortilla  (about 10 net carbs )  Medical laboratory scientific officer (basically a quiche without the pastry crust) that is eaten cold and very convenient way to get your eggs.  8 carbs)  If you make your own protein shakes, avoid bananas and pineapple,  And use low carb greek yogurt or original /unsweetened almond or soy milk    Avoid cereal and bananas, oatmeal and cream of wheat and grits. They are loaded with carbohydrates!   10 AM: high protein snack:  Protein bar by Atkins (the snack size, under 200 cal, usually < 6 net carbs).    A stick of cheese:  Around 1 carb,  100 cal     Dannon Light n Fit [place] Yogurt  (80 cal, 8 carbs)  Other so called "protein bars" and Greek yogurts tend to be loaded with carbohydrates.  Remember, in food advertising, the word "energy" is synonymous for " carbohydrate."  Lunch:   A Sandwich using the bread choices listed, Can use any  Eggs,  lunchmeat, grilled meat or canned tuna), avocado, regular mayo/mustard  and cheese.  A Salad using blue cheese, ranch,  Goddess or vinagrette,  Avoid taco shells, croutons or "confetti" and no "candied nuts" but regular nuts OK.   No pretzels, nabs  or chips.  Pickles and miniature sweet peppers are a good low carb alternative that provide a "crunch"  The bread is the only  source of carbohydrate in a sandwich and  can be decreased by trying some of the attached alternatives to traditional loaf bread   Avoid "Low fat dressings, as well as Reyne Dumas and [employer] dressings They are loaded with sugar!   3 PM/ Mid day  Snack:  Consider  1 ounce of  almonds, walnuts, pistachios, pecans, peanuts,  Macadamia nuts or a nut medley.  Avoid "granola and granola bars "  Mixed nuts are ok in moderation as long as there are no raisins,  cranberries or dried fruit.   KIND bars are OK if you get the low glycemic index variety   Try the prosciutto/mozzarella cheese sticks by Fiorruci  In deli /backery section   High protein  6 PM  Dinner:     Meat/fowl/fish with a green salad, and either broccoli, cauliflower, green beans, spinach, brussel sprouts or  Lima beans. DO NOT BREAD THE PROTEIN!!      There is a low carb pasta by Dreamfield's that is acceptable and tastes great: only 5 digestible carbs/serving.( All grocery stores but BJs carry it ) Several ready made meals are available low carb:   Try Michel Angelo's chicken piccata or chicken or eggplant parm over low carb pasta.(Lowes and BJs)   Clifton Custard Sanchez's "Carnitas" (pulled pork, no sauce,  0 carbs) or his beef pot roast to make a dinner burrito (at [employer]J's)  Pesto over low carb pasta (bj's sells a good quality pesto in the center refrigerated section of the deli   Try satueeing  Roosvelt Harps with mushroooms as a good side   [employer] Giant makes a mashed cauliflower that tastes like mashed potatoes  Whole wheat pasta is still full of digestible carbs and  Not as low in glycemic index as Dreamfield's.   Brown rice is still rice,  So skip the rice and noodles if you eat [place] or [place] (or at least limit to 1/2 cup)  9 PM snack :   Breyer's "low carb" fudgsicle or  ice cream bar (Carb Smart line), or  Weight Watcher's ice cream bar , or another "no sugar added" ice cream;  a serving of fresh berries/cherries with whipped  cream   Cheese or DANNON'S LlGHT N FIT GREEK YOGURT  8 ounces of [place] unsweetened almond/cococunut milk    Treat yourself to a parfait made with whipped cream blueberiies, walnuts and vanilla greek yogurt  Avoid bananas, pineapple, grapes  and watermelon on a regular basis because they are high in sugar.  THINK OF THEM AS DESSERT  Remember that snack Substitutions should be less than 10 NET carbs per serving and meals < 20 carbs. Remember to subtract fiber grams to get the "net carbs."

## 2023-06-01 NOTE — Assessment & Plan Note (Signed)
Improved.  Episodes are less frequent.  Orthostatics repeated today;patient was not orthostatic exam. Will monitor.

## 2023-06-01 NOTE — Assessment & Plan Note (Signed)
Unchanged. Discussed lifestyle management in regards to weight loss, gradual increase physical activity to address fatigue, mild OSA.  Patient will speak with her dentist regarding oral appliance. Printed information regarding low glycemic diet. We discussed nutrition consult and referral to [facility]; she may consider these options in the future. Will follow.

## 2023-06-09 ENCOUNTER — Other Ambulatory Visit: Payer: Self-pay | Admitting: *Deleted

## 2023-06-09 DIAGNOSIS — G43019 Migraine without aura, intractable, without status migrainosus: Secondary | ICD-10-CM

## 2023-06-09 MED ORDER — BOTOX 100 UNITS IJ SOLR: INTRAMUSCULAR | 1 refills | Status: DC

## 2023-06-09 NOTE — Telephone Encounter (Signed)
Last seen on 04/14/23 for botox Follow up scheduled on 07/08/23 for botox

## 2023-06-11 ENCOUNTER — Other Ambulatory Visit: Payer: Self-pay | Admitting: Medical Genetics

## 2023-06-11 DIAGNOSIS — Z006 Encounter for examination for normal comparison and control in clinical research program: Secondary | ICD-10-CM

## 2023-06-16 ENCOUNTER — Telehealth: Payer: Self-pay

## 2023-06-16 ENCOUNTER — Other Ambulatory Visit (HOSPITAL_COMMUNITY): Payer: Self-pay

## 2023-06-16 NOTE — Telephone Encounter (Signed)
*  [facility]  Pharmacy Patient Advocate Encounter   Received notification from CoverMyMeds that prior authorization for Nurtec 75MG  dispersible tablets  is required/requested.   Insurance verification completed.   The patient is insured through [facility] .   Per test claim: PA required; PA submitted to [facility] via CoverMyMeds Key/confirmation #/EOC [place] Pew Status is pending

## 2023-06-17 ENCOUNTER — Ambulatory Visit: Payer: Managed Care, Other (non HMO) | Admitting: Podiatry

## 2023-06-17 ENCOUNTER — Encounter: Payer: Self-pay | Admitting: Podiatry

## 2023-06-17 ENCOUNTER — Encounter: Payer: Self-pay | Admitting: Family Medicine

## 2023-06-17 ENCOUNTER — Ambulatory Visit (INDEPENDENT_AMBULATORY_CARE_PROVIDER_SITE_OTHER): Payer: Managed Care, Other (non HMO)

## 2023-06-17 DIAGNOSIS — M778 Other enthesopathies, not elsewhere classified: Secondary | ICD-10-CM | POA: Diagnosis not present

## 2023-06-17 DIAGNOSIS — H1045 Other chronic allergic conjunctivitis: Secondary | ICD-10-CM | POA: Insufficient documentation

## 2023-06-17 MED ORDER — TRIAMCINOLONE ACETONIDE 40 MG/ML IJ SUSP
40.0000 mg | Freq: Once | INTRAMUSCULAR | Status: AC
Start: 2023-06-17 — End: 2023-06-17
  Administered 2023-06-17: 40 mg

## 2023-06-17 NOTE — Progress Notes (Signed)
Janet Gallegos presents today concerned about pain to the first and fifth metatarsophalangeal joints of the right foot.  Janet Gallegos states this been aching a couple of months no real injury however Janet Gallegos has been praying multiple times a day with her partner as is their culture.  Janet Gallegos states that Janet Gallegos has to sit with her feet turned back during that time.  Objective: Vital signs are stable Janet Gallegos is alert and oriented x 3.  Janet Gallegos has some tenderness on palpation to the first and fourth intermetatarsal areas right at the metatarsal phalangeal joint.  Radiographically only a screw is intact to the first metatarsal otherwise I see no obvious abnormalities and no acute findings are noted.  Assessment: Capsulitis neuritis interspace first and fourth right.  Plan: I injected Kenalog 10 mg with local anesthetic to each interspace.  Should this not alleviate her symptoms Janet Gallegos will notify us immediately.

## 2023-06-26 ENCOUNTER — Other Ambulatory Visit (HOSPITAL_COMMUNITY): Payer: Self-pay

## 2023-06-26 NOTE — Telephone Encounter (Signed)
Pharmacy Patient Advocate Encounter  Received notification from [facility] that Prior Authorization for Nurtec 75mg  has been APPROVED from 06/16/2023 to 06/15/2024. Ran test claim, Copay is $0.00. This test claim was processed through [facility]- copay amounts may vary at other pharmacies due to pharmacy/plan contracts, or as the patient moves through the different stages of their insurance plan.

## 2023-06-30 ENCOUNTER — Telehealth: Payer: Self-pay | Admitting: Family Medicine

## 2023-06-30 NOTE — Telephone Encounter (Signed)
[facility] Pasty Spillers) need to answer some clinical questions before scheduling delivery of Botox.

## 2023-06-30 NOTE — Telephone Encounter (Signed)
I returned [facility]'s call, they just needed to schedule Botox delivery. TBD 10/30.

## 2023-07-06 NOTE — Progress Notes (Signed)
07/08/2023 ALL: Janet Gallegos returns for Botox. She reports migraines have been much better. Less stress. She hasn't had a migraine in over a month.   04/14/2023 ALL: She returns for Botox. Migraines have been more frequent and intense, recently. She received IV cocktail 01/2023 that worked well for abortive therapy. She did not take oral taper. Headaches have been manageable. Usually 1-2 migraines per month. Toward end of cyle she may have 4-5. She continues Ajovy and Nurtec.   01/14/2023 ALL: She returns for Botox. She is doing well. She continues Ajovy and Nurtec. Migraines are usually well managed until last two weeks before Botox. Nurtec works well.   10/21/2022 ALL: Janet Gallegos returns for Botox. She continues Ajovy and Nurtec. Migraines are well managed.   07/29/2022 ALL: Janet Gallegos returns for Botox. She continues Ajovy and Nurtec. She is doing well. Migraines have settled down.   05/06/2022 ALL: She returns for Botox. She is doing well. She did have a breakthrough migraine in the setting of abrupt discontinuation of Vyvanse and travel to [place]. She is using Nurtec as needed that does help.   01/23/2022 ALL: She returns for Botox. She continues Ajovy and Nurtec. She reports doing very well. She has only had one migraine since last visit.   10/31/2021 ALL: She returns for Botox. Ajovy seems to be helping. Migraines are less frequent and less severe. Nurtec helps with abortive therapy. She is feeling well, today   08/06/2021 ALL: She continues Botox and Nurtec. We started Ajovy about 2 weeks ago after worsening and prolonged migraines. She has not picked up from pharmacy yet. She was encouraged to take Nurtec with Tylenol, ibuprofen and Benadryl. She is doing a little better but continues to have regular headache days, especially with storms.   05/02/2021 ALL: She continues Botox and Nurtec. She is doing well. She did have an intractable migraine about 2 weeks ago. Decadron on back order and medrol dose pack  was given. It did help.   01/30/2021 ALL: She continues to do well with Botox therapy. Nurtec works well for abortive therapy. She will occasionally use rizatriptan if Nurtec not effective. She has had 1 migraine this month after being out of Vyvanse for a few days.   10/25/2020 ALL: She is doing well. Botox continues to be effective in migraine management. Nurtec helps with abortive therapy.   07/24/2020 ALL: She is doing well. Migraines are well managed. She has received Nurtec and it is working well. She was recently started on Vyvanse for ADHD. No worsening in migraines thus far.   Consent Form Botulism Toxin Injection For Chronic Migraine    Reviewed orally with patient, additionally signature is on file:  Botulism toxin has been approved by the Federal drug administration for treatment of chronic migraine. Botulism toxin does not cure chronic migraine and it may not be effective in some patients.  The administration of botulism toxin is accomplished by injecting a small amount of toxin into the muscles of the neck and head. Dosage must be titrated for each individual. Any benefits resulting from botulism toxin tend to wear off after 3 months with a repeat injection required if benefit is to be maintained. Injections are usually done every 3-4 months with maximum effect peak achieved by about 2 or 3 weeks. Botulism toxin is expensive and you should be sure of what costs you will incur resulting from the injection.  The side effects of botulism toxin use for chronic migraine may include:   -Transient, and usually  mild, facial weakness with facial injections  -Transient, and usually mild, head or neck weakness with head/neck injections  -Reduction or loss of forehead facial animation due to forehead muscle weakness  -Eyelid drooping  -Dry eye  -Pain at the site of injection or bruising at the site of injection  -Double vision  -Potential unknown long term risks   Contraindications: You  should not have Botox if you are pregnant, nursing, allergic to albumin, have an infection, skin condition, or muscle weakness at the site of the injection, or have myasthenia gravis, Lambert-Eaton syndrome, or ALS.  It is also possible that as with any injection, there may be an allergic reaction or no effect from the medication. Reduced effectiveness after repeated injections is sometimes seen and rarely infection at the injection site may occur. All care will be taken to prevent these side effects. If therapy is given over a long time, atrophy and wasting in the muscle injected may occur. Occasionally the patient's become refractory to treatment because they develop antibodies to the toxin. In this event, therapy needs to be modified.  I have read the above information and consent to the administration of botulism toxin.   BOTOX PROCEDURE NOTE FOR MIGRAINE HEADACHE  Contraindications and precautions discussed with patient(above). Aseptic procedure was observed and patient tolerated procedure. Procedure performed by Shawnie Dapper, FNP-C.   The condition has existed for more than 6 months, and pt does not have a diagnosis of ALS, Myasthenia Gravis or Lambert-Eaton Syndrome.  Risks and benefits of injections discussed and pt agrees to proceed with the procedure.  Written consent obtained  These injections are medically necessary. Pt  receives good benefits from these injections. These injections do not cause sedations or hallucinations which the oral therapies may cause.   Description of procedure:  The patient was placed in a sitting position. The standard protocol was used for Botox as follows, with 5 units of Botox injected at each site:  -Procerus muscle, midline injection  -Corrugator muscle, bilateral injection  -Frontalis muscle, bilateral injection, with 2 sites each side, medial injection was performed in the upper one third of the frontalis muscle, in the region vertical from the medial  inferior edge of the superior orbital rim. The lateral injection was again in the upper one third of the forehead vertically above the lateral limbus of the cornea, 1.5 cm lateral to the medial injection site.  -Temporalis muscle injection, 4 sites, bilaterally. The first injection was 3 cm above the tragus of the ear, second injection site was 1.5 cm to 3 cm up from the first injection site in line with the tragus of the ear. The third injection site was 1.5-3 cm forward between the first 2 injection sites. The fourth injection site was 1.5 cm posterior to the second injection site. 5th site laterally in the temporalis  muscleat the level of the outer canthus.  -Occipitalis muscle injection, 3 sites, bilaterally. The first injection was done one half way between the occipital protuberance and the tip of the mastoid process behind the ear. The second injection site was done lateral and superior to the first, 1 fingerbreadth from the first injection. The third injection site was 1 fingerbreadth superiorly and medially from the first injection site.  -Cervical paraspinal muscle injection, 2 sites, bilaterally. The first injection site was 1 cm from the midline of the cervical spine, 3 cm inferior to the lower border of the occipital protuberance. The second injection site was 1.5 cm superiorly and laterally  to the first injection site.  -Trapezius muscle injection was performed at 3 sites, bilaterally. The first injection site was in the upper trapezius muscle halfway between the inflection point of the neck, and the acromion. The second injection site was one half way between the acromion and the first injection site. The third injection was done between the first injection site and the inflection point of the neck.   Will return for repeat injection in 3 months.   A total of 200 units of Botox was prepared, 155 units of Botox was injected as documented above, any Botox not injected was wasted. The  patient tolerated the procedure well, there were no complications of the above procedure.

## 2023-07-08 ENCOUNTER — Ambulatory Visit (INDEPENDENT_AMBULATORY_CARE_PROVIDER_SITE_OTHER): Payer: Managed Care, Other (non HMO) | Admitting: Family Medicine

## 2023-07-08 DIAGNOSIS — G43019 Migraine without aura, intractable, without status migrainosus: Secondary | ICD-10-CM | POA: Diagnosis not present

## 2023-07-08 MED ORDER — ONABOTULINUMTOXINA 200 UNITS IJ SOLR
155.0000 [IU] | Freq: Once | INTRAMUSCULAR | Status: AC
Start: 2023-07-08 — End: 2023-07-08
  Administered 2023-07-08: 155 [IU] via INTRAMUSCULAR

## 2023-07-08 NOTE — Progress Notes (Signed)
Botox- 100 units x 2 vials Lot: [id number] Expiration: 07/2025 NDC: 9381-8299-37  Bacteriostatic 0.9% Sodium Chloride- 2 mL  JIR:[id number] Expiration:12/01/2023 NDC: 1017-5102-58  Dx: G43.019 S/P Witnessed by Marcelina Morel, RN

## 2023-08-18 ENCOUNTER — Encounter: Payer: Self-pay | Admitting: Podiatry

## 2023-09-01 ENCOUNTER — Other Ambulatory Visit: Payer: Self-pay | Admitting: *Deleted

## 2023-09-01 MED ORDER — AJOVY 225 MG/1.5ML ~~LOC~~ SOAJ: 225.0000 mg | SUBCUTANEOUS | 0 refills | Status: DC

## 2023-09-01 NOTE — Telephone Encounter (Signed)
Last seen on 07/08/23 Follow up scheduled on 10/01/23

## 2023-09-30 NOTE — Progress Notes (Unsigned)
10/01/2023 ALL: Vise returns for Botox. She continues to do well. She continues Ajovy injections monthly. May have 1-2 migraines per month. Nurtec works well for abortive therapy.   07/08/2023 ALL: Groll returns for Botox. She reports migraines have been much better. Less stress. She hasn't had a migraine in over a month.   04/14/2023 ALL: She returns for Botox. Migraines have been more frequent and intense, recently. She received IV cocktail 01/2023 that worked well for abortive therapy. She did not take oral taper. Headaches have been manageable. Usually 1-2 migraines per month. Toward end of cyle she may have 4-5. She continues Ajovy and Nurtec.   01/14/2023 ALL: She returns for Botox. She is doing well. She continues Ajovy and Nurtec. Migraines are usually well managed until last two weeks before Botox. Nurtec works well.   10/21/2022 ALL: Elandra returns for Botox. She continues Ajovy and Nurtec. Migraines are well managed.   07/29/2022 ALL: Ilyssa returns for Botox. She continues Ajovy and Nurtec. She is doing well. Migraines have settled down.   05/06/2022 ALL: She returns for Botox. She is doing well. She did have a breakthrough migraine in the setting of abrupt discontinuation of Vyvanse and travel to [place]. She is using Nurtec as needed that does help.   01/23/2022 ALL: She returns for Botox. She continues Ajovy and Nurtec. She reports doing very well. She has only had one migraine since last visit.   10/31/2021 ALL: She returns for Botox. Ajovy seems to be helping. Migraines are less frequent and less severe. Nurtec helps with abortive therapy. She is feeling well, today   08/06/2021 ALL: She continues Botox and Nurtec. We started Ajovy about 2 weeks ago after worsening and prolonged migraines. She has not picked up from pharmacy yet. She was encouraged to take Nurtec with Tylenol, ibuprofen and Benadryl. She is doing a little better but continues to have regular headache days,  especially with storms.   05/02/2021 ALL: She continues Botox and Nurtec. She is doing well. She did have an intractable migraine about 2 weeks ago. Decadron on back order and medrol dose pack was given. It did help.   01/30/2021 ALL: She continues to do well with Botox therapy. Nurtec works well for abortive therapy. She will occasionally use rizatriptan if Nurtec not effective. She has had 1 migraine this month after being out of Vyvanse for a few days.   10/25/2020 ALL: She is doing well. Botox continues to be effective in migraine management. Nurtec helps with abortive therapy.   07/24/2020 ALL: She is doing well. Migraines are well managed. She has received Nurtec and it is working well. She was recently started on Vyvanse for ADHD. No worsening in migraines thus far.   Consent Form Botulism Toxin Injection For Chronic Migraine    Reviewed orally with patient, additionally signature is on file:  Botulism toxin has been approved by the Federal drug administration for treatment of chronic migraine. Botulism toxin does not cure chronic migraine and it may not be effective in some patients.  The administration of botulism toxin is accomplished by injecting a small amount of toxin into the muscles of the neck and head. Dosage must be titrated for each individual. Any benefits resulting from botulism toxin tend to wear off after 3 months with a repeat injection required if benefit is to be maintained. Injections are usually done every 3-4 months with maximum effect peak achieved by about 2 or 3 weeks. Botulism toxin is expensive and you should  be sure of what costs you will incur resulting from the injection.  The side effects of botulism toxin use for chronic migraine may include:   -Transient, and usually mild, facial weakness with facial injections  -Transient, and usually mild, head or neck weakness with head/neck injections  -Reduction or loss of forehead facial animation due to forehead  muscle weakness  -Eyelid drooping  -Dry eye  -Pain at the site of injection or bruising at the site of injection  -Double vision  -Potential unknown long term risks   Contraindications: You should not have Botox if you are pregnant, nursing, allergic to albumin, have an infection, skin condition, or muscle weakness at the site of the injection, or have myasthenia gravis, Lambert-Eaton syndrome, or ALS.  It is also possible that as with any injection, there may be an allergic reaction or no effect from the medication. Reduced effectiveness after repeated injections is sometimes seen and rarely infection at the injection site may occur. All care will be taken to prevent these side effects. If therapy is given over a long time, atrophy and wasting in the muscle injected may occur. Occasionally the patient's become refractory to treatment because they develop antibodies to the toxin. In this event, therapy needs to be modified.  I have read the above information and consent to the administration of botulism toxin.   BOTOX PROCEDURE NOTE FOR MIGRAINE HEADACHE  Contraindications and precautions discussed with patient(above). Aseptic procedure was observed and patient tolerated procedure. Procedure performed by Shawnie Dapper, FNP-C.   The condition has existed for more than 6 months, and pt does not have a diagnosis of ALS, Myasthenia Gravis or Lambert-Eaton Syndrome.  Risks and benefits of injections discussed and pt agrees to proceed with the procedure.  Written consent obtained  These injections are medically necessary. Pt  receives good benefits from these injections. These injections do not cause sedations or hallucinations which the oral therapies may cause.   Description of procedure:  The patient was placed in a sitting position. The standard protocol was used for Botox as follows, with 5 units of Botox injected at each site:  -Procerus muscle, midline injection  -Corrugator muscle,  bilateral injection  -Frontalis muscle, bilateral injection, with 2 sites each side, medial injection was performed in the upper one third of the frontalis muscle, in the region vertical from the medial inferior edge of the superior orbital rim. The lateral injection was again in the upper one third of the forehead vertically above the lateral limbus of the cornea, 1.5 cm lateral to the medial injection site.  -Temporalis muscle injection, 4 sites, bilaterally. The first injection was 3 cm above the tragus of the ear, second injection site was 1.5 cm to 3 cm up from the first injection site in line with the tragus of the ear. The third injection site was 1.5-3 cm forward between the first 2 injection sites. The fourth injection site was 1.5 cm posterior to the second injection site. 5th site laterally in the temporalis  muscleat the level of the outer canthus.  -Occipitalis muscle injection, 3 sites, bilaterally. The first injection was done one half way between the occipital protuberance and the tip of the mastoid process behind the ear. The second injection site was done lateral and superior to the first, 1 fingerbreadth from the first injection. The third injection site was 1 fingerbreadth superiorly and medially from the first injection site.  -Cervical paraspinal muscle injection, 2 sites, bilaterally. The first injection site was  1 cm from the midline of the cervical spine, 3 cm inferior to the lower border of the occipital protuberance. The second injection site was 1.5 cm superiorly and laterally to the first injection site.  -Trapezius muscle injection was performed at 3 sites, bilaterally. The first injection site was in the upper trapezius muscle halfway between the inflection point of the neck, and the acromion. The second injection site was one half way between the acromion and the first injection site. The third injection was done between the first injection site and the inflection point of  the neck.   Will return for repeat injection in 3 months.   A total of 200 units of Botox was prepared, 155 units of Botox was injected as documented above, any Botox not injected was wasted. The patient tolerated the procedure well, there were no complications of the above procedure.

## 2023-10-01 ENCOUNTER — Ambulatory Visit: Payer: Managed Care, Other (non HMO) | Admitting: Family Medicine

## 2023-10-01 DIAGNOSIS — G43019 Migraine without aura, intractable, without status migrainosus: Secondary | ICD-10-CM

## 2023-10-01 MED ORDER — AJOVY 225 MG/1.5ML ~~LOC~~ SOAJ: 225.0000 mg | SUBCUTANEOUS | 3 refills | Status: DC

## 2023-10-01 MED ORDER — NURTEC 75 MG PO TBDP: 1.0000 | ORAL_TABLET | Freq: Every day | ORAL | 11 refills | Status: DC | PRN

## 2023-10-01 MED ORDER — ONABOTULINUMTOXINA 100 UNITS IJ SOLR
155.0000 [IU] | Freq: Once | INTRAMUSCULAR | Status: AC
Start: 2023-10-01 — End: 2023-10-01
  Administered 2023-10-01: 155 [IU] via INTRAMUSCULAR

## 2023-10-01 NOTE — Progress Notes (Signed)
Botox- 100 units x 2 vial Lot: [id number] Expiration: 10/2025 NDC: 1610-9604-54  Bacteriostatic 0.9% Sodium Chloride- 4mL total Lot: [id number] Expiration: 07/02/2024 NDC: 1478-2956-21  Dx: H08.657 S/P  Witnessed By: Marcelina Morel, RN

## 2023-10-29 ENCOUNTER — Telehealth: Payer: Self-pay | Admitting: Family Medicine

## 2023-10-29 NOTE — Telephone Encounter (Signed)
 LVM and sent mychart msg informing pt of appt change- NP out.

## 2023-11-03 ENCOUNTER — Other Ambulatory Visit: Payer: Self-pay

## 2023-11-03 DIAGNOSIS — G43019 Migraine without aura, intractable, without status migrainosus: Secondary | ICD-10-CM

## 2023-11-03 MED ORDER — BOTOX 100 UNITS IJ SOLR: INTRAMUSCULAR | 1 refills | Status: DC

## 2023-11-04 DIAGNOSIS — N809 Endometriosis, unspecified: Secondary | ICD-10-CM | POA: Insufficient documentation

## 2023-11-04 DIAGNOSIS — R102 Pelvic and perineal pain unspecified side: Secondary | ICD-10-CM | POA: Insufficient documentation

## 2023-11-18 ENCOUNTER — Telehealth: Payer: Self-pay | Admitting: Family Medicine

## 2023-11-18 NOTE — Telephone Encounter (Signed)
Updated appt note

## 2023-11-18 NOTE — Telephone Encounter (Signed)
 [facility] has called to schedule delivery of Botox for 11-24-23 Botox SDV, powder,  90 day, 100 units quantity of 2, call back # if there are questions is [phone], office address verified

## 2023-11-30 ENCOUNTER — Ambulatory Visit: Payer: Managed Care, Other (non HMO) | Admitting: Family

## 2023-12-10 ENCOUNTER — Telehealth: Payer: Self-pay | Admitting: Family Medicine

## 2023-12-10 NOTE — Telephone Encounter (Signed)
 Completed [employer] PA renewal form and faxed with OV notes to [phone].

## 2023-12-21 NOTE — Telephone Encounter (Signed)
 Called [employer] to check status of Janet Gallegos, spoke with Peterson Brandt. She states Janet Gallegos was never received. I verified fax # as [phone] and refaxed.

## 2023-12-23 NOTE — Telephone Encounter (Signed)
 Called [employer] to make sure Siegfried Dress was received, spoke with Media Spikes. She states that Siegfried Dress was received back on 4/11, but was cancelled due to [facility] being listed on the auth. In the future, I will have to obtain auths from both the medical side (to cover [phone]) and [facility] PBM to fill through [facility].  Rep [employer] Sp[employer] was able to reinstate auth and send for clinical review, pending auth # is [id number]. [employer] doesn't provide reference #s for calls but she said I can use case #: [id number] and rep's name Media Spikes along with today's date 12/23/23.  Submitted auth request to [facility] via CMM and received approval. Auth#: [id number] (12/23/23-03/23/24)

## 2023-12-28 NOTE — Telephone Encounter (Signed)
 Janet Gallegos: [id number] (12/23/23-12/21/24)

## 2023-12-29 ENCOUNTER — Ambulatory Visit: Payer: Managed Care, Other (non HMO) | Admitting: Family Medicine

## 2023-12-31 NOTE — Progress Notes (Unsigned)
 10/01/2023 ALL: Mielnik returns for Botox . Migraines remain well managed. Maybe 1-2 per month. She continues Ajovy  and Nurtec. She is having right sided temporal and jaw sensitivity following Covid and influenza infections about a month ago. She has seen PCP.   10/01/2023 ALL: Vilches returns for Botox . She continues to do well. She continues Ajovy  injections monthly. May have 1-2 migraines per month. Nurtec works well for abortive therapy.   07/08/2023 ALL: Scheidel returns for Botox . She reports migraines have been much better. Less stress. She hasn't had a migraine in over a month.   04/14/2023 ALL: She returns for Botox . Migraines have been more frequent and intense, recently. She received IV cocktail 01/2023 that worked well for abortive therapy. She did not take oral taper. Headaches have been manageable. Usually 1-2 migraines per month. Toward end of cyle she may have 4-5. She continues Ajovy  and Nurtec.   01/14/2023 ALL: She returns for Botox . She is doing well. She continues Ajovy  and Nurtec. Migraines are usually well managed until last two weeks before Botox . Nurtec works well.   10/21/2022 ALL: Shoni returns for Botox . She continues Ajovy  and Nurtec. Migraines are well managed.   07/29/2022 ALL: Orfelinda returns for Botox . She continues Ajovy  and Nurtec. She is doing well. Migraines have settled down.   05/06/2022 ALL: She returns for Botox . She is doing well. She did have a breakthrough migraine in the setting of abrupt discontinuation of Vyvanse and travel to [place]. She is using Nurtec as needed that does help.   01/23/2022 ALL: She returns for Botox . She continues Ajovy  and Nurtec. She reports doing very well. She has only had one migraine since last visit.   10/31/2021 ALL: She returns for Botox . Ajovy  seems to be helping. Migraines are less frequent and less severe. Nurtec helps with abortive therapy. She is feeling well, today   08/06/2021 ALL: She continues Botox  and Nurtec. We  started Ajovy  about 2 weeks ago after worsening and prolonged migraines. She has not picked up from pharmacy yet. She was encouraged to take Nurtec with Tylenol , ibuprofen  and Benadryl . She is doing a little better but continues to have regular headache days, especially with storms.   05/02/2021 ALL: She continues Botox  and Nurtec. She is doing well. She did have an intractable migraine about 2 weeks ago. Decadron  on back order and medrol  dose pack was given. It did help.   01/30/2021 ALL: She continues to do well with Botox  therapy. Nurtec works well for abortive therapy. She will occasionally use rizatriptan  if Nurtec not effective. She has had 1 migraine this month after being out of Vyvanse for a few days.   10/25/2020 ALL: She is doing well. Botox  continues to be effective in migraine management. Nurtec helps with abortive therapy.   07/24/2020 ALL: She is doing well. Migraines are well managed. She has received Nurtec and it is working well. She was recently started on Vyvanse for ADHD. No worsening in migraines thus far.   Consent Form Botulism Toxin Injection For Chronic Migraine    Reviewed orally with patient, additionally signature is on file:  Botulism toxin has been approved by the Federal drug administration for treatment of chronic migraine. Botulism toxin does not cure chronic migraine and it may not be effective in some patients.  The administration of botulism toxin is accomplished by injecting a small amount of toxin into the muscles of the neck and head. Dosage must be titrated for each individual. Any benefits resulting from botulism toxin  tend to wear off after 3 months with a repeat injection required if benefit is to be maintained. Injections are usually done every 3-4 months with maximum effect peak achieved by about 2 or 3 weeks. Botulism toxin is expensive and you should be sure of what costs you will incur resulting from the injection.  The side effects of botulism toxin  use for chronic migraine may include:   -Transient, and usually mild, facial weakness with facial injections  -Transient, and usually mild, head or neck weakness with head/neck injections  -Reduction or loss of forehead facial animation due to forehead muscle weakness  -Eyelid drooping  -Dry eye  -Pain at the site of injection or bruising at the site of injection  -Double vision  -Potential unknown long term risks   Contraindications: You should not have Botox  if you are pregnant, nursing, allergic to albumin, have an infection, skin condition, or muscle weakness at the site of the injection, or have myasthenia gravis, Lambert-Eaton syndrome, or ALS.  It is also possible that as with any injection, there may be an allergic reaction or no effect from the medication. Reduced effectiveness after repeated injections is sometimes seen and rarely infection at the injection site may occur. All care will be taken to prevent these side effects. If therapy is given over a long time, atrophy and wasting in the muscle injected may occur. Occasionally the patient's become refractory to treatment because they develop antibodies to the toxin. In this event, therapy needs to be modified.  I have read the above information and consent to the administration of botulism toxin.   BOTOX  PROCEDURE NOTE FOR MIGRAINE HEADACHE  Contraindications and precautions discussed with patient(above). Aseptic procedure was observed and patient tolerated procedure. Procedure performed by Terrilyn Fick, FNP-C.   The condition has existed for more than 6 months, and pt does not have a diagnosis of ALS, Myasthenia Gravis or Lambert-Eaton Syndrome.  Risks and benefits of injections discussed and pt agrees to proceed with the procedure.  Written consent obtained  These injections are medically necessary. Pt  receives good benefits from these injections. These injections do not cause sedations or hallucinations which the oral therapies  may cause.   Description of procedure:  The patient was placed in a sitting position. The standard protocol was used for Botox  as follows, with 5 units of Botox  injected at each site:  -Procerus muscle, midline injection  -Corrugator muscle, bilateral injection  -Frontalis muscle, bilateral injection, with 2 sites each side, medial injection was performed in the upper one third of the frontalis muscle, in the region vertical from the medial inferior edge of the superior orbital rim. The lateral injection was again in the upper one third of the forehead vertically above the lateral limbus of the cornea, 1.5 cm lateral to the medial injection site.  -Temporalis muscle injection, 4 sites, bilaterally. The first injection was 3 cm above the tragus of the ear, second injection site was 1.5 cm to 3 cm up from the first injection site in line with the tragus of the ear. The third injection site was 1.5-3 cm forward between the first 2 injection sites. The fourth injection site was 1.5 cm posterior to the second injection site. 5th site laterally in the temporalis  muscleat the level of the outer canthus.  -Occipitalis muscle injection, 3 sites, bilaterally. The first injection was done one half way between the occipital protuberance and the tip of the mastoid process behind the ear. The second injection  site was done lateral and superior to the first, 1 fingerbreadth from the first injection. The third injection site was 1 fingerbreadth superiorly and medially from the first injection site.  -Cervical paraspinal muscle injection, 2 sites, bilaterally. The first injection site was 1 cm from the midline of the cervical spine, 3 cm inferior to the lower border of the occipital protuberance. The second injection site was 1.5 cm superiorly and laterally to the first injection site.  -Trapezius muscle injection was performed at 3 sites, bilaterally. The first injection site was in the upper trapezius muscle  halfway between the inflection point of the neck, and the acromion. The second injection site was one half way between the acromion and the first injection site. The third injection was done between the first injection site and the inflection point of the neck.   Will return for repeat injection in 3 months.   A total of 200 units of Botox  was prepared, 155 units of Botox  was injected as documented above, any Botox  not injected was wasted. The patient tolerated the procedure well, there were no complications of the above procedure.

## 2024-01-05 ENCOUNTER — Ambulatory Visit: Payer: Managed Care, Other (non HMO) | Admitting: Family Medicine

## 2024-01-05 VITALS — BP 137/90 | HR 109 | Ht 64.0 in | Wt 191.6 lb

## 2024-01-05 DIAGNOSIS — G43019 Migraine without aura, intractable, without status migrainosus: Secondary | ICD-10-CM | POA: Diagnosis not present

## 2024-01-05 MED ORDER — ONABOTULINUMTOXINA 100 UNITS IJ SOLR
155.0000 [IU] | Freq: Once | INTRAMUSCULAR | Status: AC
  Administered 2024-01-05: 155 [IU] via INTRAMUSCULAR

## 2024-01-05 NOTE — Progress Notes (Signed)
 Botox -100U x 2 vials  Vial 1: Lot: [id number] Expiration:11/2025 NDC: 9629-5284-13  Vial 2:  Lot: [id number] Exp: 12/2025 NDC: 2536-6440-34  Bacteriostatic 0.9% Sodium Chloride - 4mL total [id number] Expiration:07/02/2024 NDC: 7564-3329-51  Specialty pharmacy  Witnessed by: Pierce Brewster, CMA

## 2024-01-20 ENCOUNTER — Telehealth: Payer: Self-pay | Admitting: Family Medicine

## 2024-01-20 NOTE — Telephone Encounter (Signed)
 LVM and sent mychart msg informing pt of need to reschedule 03/21/24 appt - NP out  If patient calls back to r/s you can offer 03/11/24 with Amy.   If she can't do that then please make sure her new appointment is scheduled at least 10 days after her botox  appointment

## 2024-02-01 ENCOUNTER — Ambulatory Visit: Admitting: Podiatry

## 2024-02-01 ENCOUNTER — Ambulatory Visit (INDEPENDENT_AMBULATORY_CARE_PROVIDER_SITE_OTHER)

## 2024-02-01 ENCOUNTER — Encounter: Payer: Self-pay | Admitting: Podiatry

## 2024-02-01 DIAGNOSIS — T847XXA Infection and inflammatory reaction due to other internal orthopedic prosthetic devices, implants and grafts, initial encounter: Secondary | ICD-10-CM | POA: Diagnosis not present

## 2024-02-02 NOTE — Progress Notes (Signed)
 She presents today chief concern of pain around the first metatarsal dorsally.  She had a previous bunion surgery performed which has been many years ago and has recently developed some redness and soreness where the screw is.  She states that the screw rubs her shoes and is painful with shoe gear and activity.  Objective: I reviewed her past medical his medications and surgeries and social history.  Pulses are strong and palpable right foot.  She does have a scar overlying the first metatarsal phalangeal joint with nonpulsatile nodular masses present about mid diaphyseal dorsal medial aspect of the fifth metatarsal right foot.  Just medial to the incision site.  Radiographs taken today demonstrate an osseously mature individual with a very prominent screw head.  The screw is oriented from distal to proximal.  No fractures.  Bone density appears to be normal.  Assessment: Painful internal fixation screw first metatarsal right foot.  Plan: Discussed etiology pathology conservative versus surgical therapies.  At this point since it is prominent and starting to cause irritation with shoe gear and activity I feel it necessary to try to remove this screw.  We consented her today for screw removal of the first metatarsal right foot.  We did discuss the possible postop complications which may include but not limited to postop pain bleeding swelling infection recurrence need for further surgery overcorrection under correction loss of digit loss limb loss of life.

## 2024-02-03 ENCOUNTER — Ambulatory Visit: Admitting: Family Medicine

## 2024-02-03 ENCOUNTER — Telehealth: Payer: Self-pay | Admitting: Podiatry

## 2024-02-03 NOTE — Telephone Encounter (Signed)
 Received surgical consent form.  Left message for pt to call to get surgery scheduled.

## 2024-02-29 ENCOUNTER — Telehealth: Payer: Self-pay | Admitting: Family Medicine

## 2024-02-29 NOTE — Telephone Encounter (Signed)
Updated appt note

## 2024-02-29 NOTE — Telephone Encounter (Signed)
 [facility] @ [facility]  has called with delivery details for Botox  delivery date 7-2 SDV, 100 unit powder, 90 day quantity of 2

## 2024-02-29 NOTE — Telephone Encounter (Signed)
 Natalie barrows renewal: [id number] (02/29/24-05/31/24)

## 2024-03-19 ENCOUNTER — Encounter (INDEPENDENT_AMBULATORY_CARE_PROVIDER_SITE_OTHER): Payer: Self-pay

## 2024-03-21 ENCOUNTER — Ambulatory Visit: Admitting: Family Medicine

## 2024-03-22 ENCOUNTER — Other Ambulatory Visit
Admission: RE | Admit: 2024-03-22 | Discharge: 2024-03-22 | Disposition: A | Discharge status: Home / Self Care | Payer: Self-pay | Source: Ambulatory Visit | Attending: Medical Genetics | Admitting: Medical Genetics

## 2024-03-22 DIAGNOSIS — Z006 Encounter for examination for normal comparison and control in clinical research program: Secondary | ICD-10-CM | POA: Insufficient documentation

## 2024-03-28 NOTE — Progress Notes (Unsigned)
 03/29/2024 ALL: Janet Gallegos returns for Botox . She is doing great. She continues Ajovy  and Nurtec. She is s/p hysterectomy and recovering well.   01/05/2024 ALL: Janet Gallegos returns for Botox . Migraines remain well managed. Maybe 1-2 per month. She continues Ajovy  and Nurtec. She is having right sided temporal and jaw sensitivity following Covid and influenza infections about a month ago. She has seen PCP.   10/01/2023 ALL: Janet Gallegos returns for Botox . She continues to do well. She continues Ajovy  injections monthly. May have 1-2 migraines per month. Nurtec works well for abortive therapy.   07/08/2023 ALL: Janet Gallegos returns for Botox . She reports migraines have been much better. Less stress. She hasn't had a migraine in over a month.   04/14/2023 ALL: She returns for Botox . Migraines have been more frequent and intense, recently. She received IV cocktail 01/2023 that worked well for abortive therapy. She did not take oral taper. Headaches have been manageable. Usually 1-2 migraines per month. Toward end of cyle she may have 4-5. She continues Ajovy  and Nurtec.   01/14/2023 ALL: She returns for Botox . She is doing well. She continues Ajovy  and Nurtec. Migraines are usually well managed until last two weeks before Botox . Nurtec works well.   10/21/2022 ALL: Janet Gallegos returns for Botox . She continues Ajovy  and Nurtec. Migraines are well managed.   07/29/2022 ALL: Janet Gallegos returns for Botox . She continues Ajovy  and Nurtec. She is doing well. Migraines have settled down.   05/06/2022 ALL: She returns for Botox . She is doing well. She did have a breakthrough migraine in the setting of abrupt discontinuation of Vyvanse and travel to [place]. She is using Nurtec as needed that does help.   01/23/2022 ALL: She returns for Botox . She continues Ajovy  and Nurtec. She reports doing very well. She has only had one migraine since last visit.   10/31/2021 ALL: She returns for Botox . Ajovy  seems to be helping. Migraines are less  frequent and less severe. Nurtec helps with abortive therapy. She is feeling well, today   08/06/2021 ALL: She continues Botox  and Nurtec. We started Ajovy  about 2 weeks ago after worsening and prolonged migraines. She has not picked up from pharmacy yet. She was encouraged to take Nurtec with Tylenol , ibuprofen  and Benadryl . She is doing a little better but continues to have regular headache days, especially with storms.   05/02/2021 ALL: She continues Botox  and Nurtec. She is doing well. She did have an intractable migraine about 2 weeks ago. Decadron  on back order and medrol  dose pack was given. It did help.   01/30/2021 ALL: She continues to do well with Botox  therapy. Nurtec works well for abortive therapy. She will occasionally use rizatriptan  if Nurtec not effective. She has had 1 migraine this month after being out of Vyvanse for a few days.   10/25/2020 ALL: She is doing well. Botox  continues to be effective in migraine management. Nurtec helps with abortive therapy.   07/24/2020 ALL: She is doing well. Migraines are well managed. She has received Nurtec and it is working well. She was recently started on Vyvanse for ADHD. No worsening in migraines thus far.   Consent Form Botulism Toxin Injection For Chronic Migraine    Reviewed orally with patient, additionally signature is on file:  Botulism toxin has been approved by the Federal drug administration for treatment of chronic migraine. Botulism toxin does not cure chronic migraine and it may not be effective in some patients.  The administration of botulism toxin is accomplished by injecting a small  amount of toxin into the muscles of the neck and head. Dosage must be titrated for each individual. Any benefits resulting from botulism toxin tend to wear off after 3 months with a repeat injection required if benefit is to be maintained. Injections are usually done every 3-4 months with maximum effect peak achieved by about 2 or 3 weeks.  Botulism toxin is expensive and you should be sure of what costs you will incur resulting from the injection.  The side effects of botulism toxin use for chronic migraine may include:   -Transient, and usually mild, facial weakness with facial injections  -Transient, and usually mild, head or neck weakness with head/neck injections  -Reduction or loss of forehead facial animation due to forehead muscle weakness  -Eyelid drooping  -Dry eye  -Pain at the site of injection or bruising at the site of injection  -Double vision  -Potential unknown long term risks   Contraindications: You should not have Botox  if you are pregnant, nursing, allergic to albumin, have an infection, skin condition, or muscle weakness at the site of the injection, or have myasthenia gravis, Lambert-Eaton syndrome, or ALS.  It is also possible that as with any injection, there may be an allergic reaction or no effect from the medication. Reduced effectiveness after repeated injections is sometimes seen and rarely infection at the injection site may occur. All care will be taken to prevent these side effects. If therapy is given over a long time, atrophy and wasting in the muscle injected may occur. Occasionally the patient's become refractory to treatment because they develop antibodies to the toxin. In this event, therapy needs to be modified.  I have read the above information and consent to the administration of botulism toxin.   BOTOX  PROCEDURE NOTE FOR MIGRAINE HEADACHE  Contraindications and precautions discussed with patient(above). Aseptic procedure was observed and patient tolerated procedure. Procedure performed by Greig Forbes, FNP-C.   The condition has existed for more than 6 months, and pt does not have a diagnosis of ALS, Myasthenia Gravis or Lambert-Eaton Syndrome.  Risks and benefits of injections discussed and pt agrees to proceed with the procedure.  Written consent obtained  These injections are  medically necessary. Pt  receives good benefits from these injections. These injections do not cause sedations or hallucinations which the oral therapies may cause.   Description of procedure:  The patient was placed in a sitting position. The standard protocol was used for Botox  as follows, with 5 units of Botox  injected at each site:  -Procerus muscle, midline injection  -Corrugator muscle, bilateral injection  -Frontalis muscle, bilateral injection, with 2 sites each side, medial injection was performed in the upper one third of the frontalis muscle, in the region vertical from the medial inferior edge of the superior orbital rim. The lateral injection was again in the upper one third of the forehead vertically above the lateral limbus of the cornea, 1.5 cm lateral to the medial injection site.  -Temporalis muscle injection, 4 sites, bilaterally. The first injection was 3 cm above the tragus of the ear, second injection site was 1.5 cm to 3 cm up from the first injection site in line with the tragus of the ear. The third injection site was 1.5-3 cm forward between the first 2 injection sites. The fourth injection site was 1.5 cm posterior to the second injection site. 5th site laterally in the temporalis  muscleat the level of the outer canthus.  -Occipitalis muscle injection, 3 sites, bilaterally. The  first injection was done one half way between the occipital protuberance and the tip of the mastoid process behind the ear. The second injection site was done lateral and superior to the first, 1 fingerbreadth from the first injection. The third injection site was 1 fingerbreadth superiorly and medially from the first injection site.  -Cervical paraspinal muscle injection, 2 sites, bilaterally. The first injection site was 1 cm from the midline of the cervical spine, 3 cm inferior to the lower border of the occipital protuberance. The second injection site was 1.5 cm superiorly and laterally to the  first injection site.  -Trapezius muscle injection was performed at 3 sites, bilaterally. The first injection site was in the upper trapezius muscle halfway between the inflection point of the neck, and the acromion. The second injection site was one half way between the acromion and the first injection site. The third injection was done between the first injection site and the inflection point of the neck.   Will return for repeat injection in 3 months.   A total of 200 units of Botox  was prepared, 155 units of Botox  was injected as documented above, any Botox  not injected was wasted. The patient tolerated the procedure well, there were no complications of the above procedure.

## 2024-03-29 ENCOUNTER — Encounter: Payer: Self-pay | Admitting: Family Medicine

## 2024-03-29 ENCOUNTER — Ambulatory Visit (INDEPENDENT_AMBULATORY_CARE_PROVIDER_SITE_OTHER): Admitting: Family Medicine

## 2024-03-29 VITALS — BP 115/72 | HR 97

## 2024-03-29 DIAGNOSIS — G43019 Migraine without aura, intractable, without status migrainosus: Secondary | ICD-10-CM

## 2024-03-29 MED ORDER — ONABOTULINUMTOXINA 200 UNITS IJ SOLR
155.0000 [IU] | Freq: Once | INTRAMUSCULAR | Status: AC
  Administered 2024-03-29: 155 [IU] via INTRAMUSCULAR

## 2024-03-29 NOTE — Progress Notes (Signed)
 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

## 2024-04-01 LAB — GENECONNECT MOLECULAR SCREEN: Genetic Analysis Overall Interpretation: NEGATIVE

## 2024-04-10 ENCOUNTER — Encounter: Payer: Self-pay | Admitting: Podiatry

## 2024-04-11 ENCOUNTER — Telehealth: Payer: Self-pay | Admitting: Podiatry

## 2024-04-11 NOTE — Telephone Encounter (Signed)
 Left message for pt to call me back to get her surgery scheduled.

## 2024-05-09 ENCOUNTER — Telehealth: Payer: Self-pay | Admitting: Podiatry

## 2024-05-09 NOTE — Telephone Encounter (Signed)
 DOS- 05/27/2024  1ST METATARSAL REMOVAL FIXATION DEEP K-WIRE/SCREW RT- 20680  [employer] EFFECTIVE DATE- 09/02/2023  DEDUCTIBLE- $1750 REMAINING- $0 FAMILY DEDUCTIBLE- $5250 REMAINING- $3500 OOP- $6000 REMAINING- $0 FAMILY- $12000 REMAINING- $6000 COINSURANCE- 20%  PER FAX RECEIVED FROM [employer], NO PRIOR AUTH IS REQUIRED FOR CPT CODE 79319.  DOCUMENTATION ATTACHED TO SURGERY CONSENT PACKET.

## 2024-05-10 ENCOUNTER — Other Ambulatory Visit: Payer: Self-pay | Admitting: *Deleted

## 2024-05-10 DIAGNOSIS — G43019 Migraine without aura, intractable, without status migrainosus: Secondary | ICD-10-CM

## 2024-05-10 MED ORDER — BOTOX 100 UNITS IJ SOLR: INTRAMUSCULAR | 1 refills | Status: DC

## 2024-05-10 NOTE — Telephone Encounter (Signed)
 Last seen on 03/29/24 Follow up scheduled 06/21/24

## 2024-05-23 ENCOUNTER — Telehealth: Payer: Self-pay | Admitting: Family Medicine

## 2024-05-23 NOTE — Telephone Encounter (Signed)
 I called [facility] back and set up delivery for 9/24.

## 2024-05-23 NOTE — Telephone Encounter (Signed)
 [facility]) schedule delivery of Botox . She hung up and would like a call back to schedule delivery of Botox .

## 2024-05-25 ENCOUNTER — Other Ambulatory Visit: Payer: Self-pay | Admitting: Podiatry

## 2024-05-25 MED ORDER — ONDANSETRON HCL 4 MG PO TABS: 4.0000 mg | ORAL_TABLET | Freq: Three times a day (TID) | ORAL | 0 refills | Status: AC | PRN

## 2024-05-25 MED ORDER — TRAMADOL HCL 50 MG PO TABS: 50.0000 mg | ORAL_TABLET | Freq: Three times a day (TID) | ORAL | 0 refills | Status: AC | PRN

## 2024-05-25 MED ORDER — CEPHALEXIN 500 MG PO CAPS: 500.0000 mg | ORAL_CAPSULE | Freq: Three times a day (TID) | ORAL | 0 refills | Status: DC

## 2024-05-27 DIAGNOSIS — Z4889 Encounter for other specified surgical aftercare: Secondary | ICD-10-CM | POA: Diagnosis not present

## 2024-06-01 ENCOUNTER — Ambulatory Visit: Admitting: Podiatry

## 2024-06-01 ENCOUNTER — Ambulatory Visit

## 2024-06-01 DIAGNOSIS — T847XXA Infection and inflammatory reaction due to other internal orthopedic prosthetic devices, implants and grafts, initial encounter: Secondary | ICD-10-CM | POA: Diagnosis not present

## 2024-06-01 NOTE — Progress Notes (Signed)
 She presents today for follow-up of her removal of screw from her first metatarsal of her right foot.  Date of surgery 05/27/2024.  Denies fever chills nausea muscle aches pains calf pain back pain chest pain shortness of breath.  Objective: Darco shoe is intact dry sterile dressing is intact once removed demonstrates sutures are intact margins well coapted there is no erythema edema salines drainage or odor radiographs taken today demonstrate incomplete removal of screw with out iatrogenic fracture.  Assessment: Well-healing surgical foot.  Plan: Light dressing was placed today encouraged to keep it dry and clean until I remove the sutures in 1 to 2 weeks.

## 2024-06-15 ENCOUNTER — Ambulatory Visit: Admitting: Podiatry

## 2024-06-15 DIAGNOSIS — T847XXA Infection and inflammatory reaction due to other internal orthopedic prosthetic devices, implants and grafts, initial encounter: Secondary | ICD-10-CM

## 2024-06-16 NOTE — Progress Notes (Signed)
 She presents today as a second postop visit date of surgery 05/27/2024 removal screws first metatarsal.  Denies fever chills nausea vomit muscle aches and pains.  Objective: Sutures are intact today no erythema edema salines drainage odor sutures were removed today no dehiscence of the wound.  Assessment: Healed surgical foot.  Plan: Follow-up with me on an as-needed basis.

## 2024-06-20 NOTE — Progress Notes (Unsigned)
 06/21/2024 ALL: Janet Gallegos returns for Botox . She is doing well. She has had 1 migraine since last visit. She has not taken Ajovy  in the past 3-4 months due to having a hard time getting it through pharmacy. She feels migraines may have improved following hysterectomy. She wishes to continue Botox  and Nurtec for now. May restart Ajovy  in the future, if needed.   03/29/2024 ALL: Janet Gallegos returns for Botox . She is doing great. She continues Ajovy  and Nurtec. She is s/p hysterectomy and recovering well.   01/05/2024 ALL: Janet Gallegos returns for Botox . Migraines remain well managed. Maybe 1-2 per month. She continues Ajovy  and Nurtec. She is having right sided temporal and jaw sensitivity following Covid and influenza infections about a month ago. She has seen PCP.   10/01/2023 ALL: Janet Gallegos returns for Botox . She continues to do well. She continues Ajovy  injections monthly. May have 1-2 migraines per month. Nurtec works well for abortive therapy.   07/08/2023 ALL: Janet Gallegos returns for Botox . She reports migraines have been much better. Less stress. She hasn't had a migraine in over a month.   04/14/2023 ALL: She returns for Botox . Migraines have been more frequent and intense, recently. She received IV cocktail 01/2023 that worked well for abortive therapy. She did not take oral taper. Headaches have been manageable. Usually 1-2 migraines per month. Toward end of cyle she may have 4-5. She continues Ajovy  and Nurtec.   01/14/2023 ALL: She returns for Botox . She is doing well. She continues Ajovy  and Nurtec. Migraines are usually well managed until last two weeks before Botox . Nurtec works well.   10/21/2022 ALL: Janet Gallegos returns for Botox . She continues Ajovy  and Nurtec. Migraines are well managed.   07/29/2022 ALL: Janet Gallegos returns for Botox . She continues Ajovy  and Nurtec. She is doing well. Migraines have settled down.   05/06/2022 ALL: She returns for Botox . She is doing well. She did have a breakthrough  migraine in the setting of abrupt discontinuation of Vyvanse and travel to [place]. She is using Nurtec as needed that does help.   01/23/2022 ALL: She returns for Botox . She continues Ajovy  and Nurtec. She reports doing very well. She has only had one migraine since last visit.   10/31/2021 ALL: She returns for Botox . Ajovy  seems to be helping. Migraines are less frequent and less severe. Nurtec helps with abortive therapy. She is feeling well, today   08/06/2021 ALL: She continues Botox  and Nurtec. We started Ajovy  about 2 weeks ago after worsening and prolonged migraines. She has not picked up from pharmacy yet. She was encouraged to take Nurtec with Tylenol , ibuprofen  and Benadryl . She is doing a little better but continues to have regular headache days, especially with storms.   05/02/2021 ALL: She continues Botox  and Nurtec. She is doing well. She did have an intractable migraine about 2 weeks ago. Decadron  on back order and medrol  dose pack was given. It did help.   01/30/2021 ALL: She continues to do well with Botox  therapy. Nurtec works well for abortive therapy. She will occasionally use rizatriptan  if Nurtec not effective. She has had 1 migraine this month after being out of Vyvanse for a few days.   10/25/2020 ALL: She is doing well. Botox  continues to be effective in migraine management. Nurtec helps with abortive therapy.   07/24/2020 ALL: She is doing well. Migraines are well managed. She has received Nurtec and it is working well. She was recently started on Vyvanse for ADHD. No worsening in migraines thus far.  Consent Form Botulism Toxin Injection For Chronic Migraine    Reviewed orally with patient, additionally signature is on file:  Botulism toxin has been approved by the Federal drug administration for treatment of chronic migraine. Botulism toxin does not cure chronic migraine and it may not be effective in some patients.  The administration of botulism toxin is accomplished by  injecting a small amount of toxin into the muscles of the neck and head. Dosage must be titrated for each individual. Any benefits resulting from botulism toxin tend to wear off after 3 months with a repeat injection required if benefit is to be maintained. Injections are usually done every 3-4 months with maximum effect peak achieved by about 2 or 3 weeks. Botulism toxin is expensive and you should be sure of what costs you will incur resulting from the injection.  The side effects of botulism toxin use for chronic migraine may include:   -Transient, and usually mild, facial weakness with facial injections  -Transient, and usually mild, head or neck weakness with head/neck injections  -Reduction or loss of forehead facial animation due to forehead muscle weakness  -Eyelid drooping  -Dry eye  -Pain at the site of injection or bruising at the site of injection  -Double vision  -Potential unknown long term risks   Contraindications: You should not have Botox  if you are pregnant, nursing, allergic to albumin, have an infection, skin condition, or muscle weakness at the site of the injection, or have myasthenia gravis, Lambert-Eaton syndrome, or ALS.  It is also possible that as with any injection, there may be an allergic reaction or no effect from the medication. Reduced effectiveness after repeated injections is sometimes seen and rarely infection at the injection site may occur. All care will be taken to prevent these side effects. If therapy is given over a long time, atrophy and wasting in the muscle injected may occur. Occasionally the patient's become refractory to treatment because they develop antibodies to the toxin. In this event, therapy needs to be modified.  I have read the above information and consent to the administration of botulism toxin.   BOTOX  PROCEDURE NOTE FOR MIGRAINE HEADACHE  Contraindications and precautions discussed with patient(above). Aseptic procedure was observed  and patient tolerated procedure. Procedure performed by Greig Forbes, FNP-C.   The condition has existed for more than 6 months, and pt does not have a diagnosis of ALS, Myasthenia Gravis or Lambert-Eaton Syndrome.  Risks and benefits of injections discussed and pt agrees to proceed with the procedure.  Written consent obtained  These injections are medically necessary. Pt  receives good benefits from these injections. These injections do not cause sedations or hallucinations which the oral therapies may cause.   Description of procedure:  The patient was placed in a sitting position. The standard protocol was used for Botox  as follows, with 5 units of Botox  injected at each site:  -Procerus muscle, midline injection  -Corrugator muscle, bilateral injection  -Frontalis muscle, bilateral injection, with 2 sites each side, medial injection was performed in the upper one third of the frontalis muscle, in the region vertical from the medial inferior edge of the superior orbital rim. The lateral injection was again in the upper one third of the forehead vertically above the lateral limbus of the cornea, 1.5 cm lateral to the medial injection site.  -Temporalis muscle injection, 4 sites, bilaterally. The first injection was 3 cm above the tragus of the ear, second injection site was 1.5 cm to 3  cm up from the first injection site in line with the tragus of the ear. The third injection site was 1.5-3 cm forward between the first 2 injection sites. The fourth injection site was 1.5 cm posterior to the second injection site. 5th site laterally in the temporalis  muscleat the level of the outer canthus.  -Occipitalis muscle injection, 3 sites, bilaterally. The first injection was done one half way between the occipital protuberance and the tip of the mastoid process behind the ear. The second injection site was done lateral and superior to the first, 1 fingerbreadth from the first injection. The third  injection site was 1 fingerbreadth superiorly and medially from the first injection site.  -Cervical paraspinal muscle injection, 2 sites, bilaterally. The first injection site was 1 cm from the midline of the cervical spine, 3 cm inferior to the lower border of the occipital protuberance. The second injection site was 1.5 cm superiorly and laterally to the first injection site.  -Trapezius muscle injection was performed at 3 sites, bilaterally. The first injection site was in the upper trapezius muscle halfway between the inflection point of the neck, and the acromion. The second injection site was one half way between the acromion and the first injection site. The third injection was done between the first injection site and the inflection point of the neck.   Will return for repeat injection in 3 months.   A total of 200 units of Botox  was prepared, 155 units of Botox  was injected as documented above, any Botox  not injected was wasted. The patient tolerated the procedure well, there were no complications of the above procedure.

## 2024-06-21 ENCOUNTER — Ambulatory Visit (INDEPENDENT_AMBULATORY_CARE_PROVIDER_SITE_OTHER): Admitting: Family Medicine

## 2024-06-21 ENCOUNTER — Encounter: Payer: Self-pay | Admitting: Family Medicine

## 2024-06-21 VITALS — BP 139/90

## 2024-06-21 DIAGNOSIS — G43019 Migraine without aura, intractable, without status migrainosus: Secondary | ICD-10-CM | POA: Diagnosis not present

## 2024-06-21 MED ORDER — ONABOTULINUMTOXINA 100 UNITS IJ SOLR
100.0000 [IU] | Freq: Once | INTRAMUSCULAR | Status: AC
  Administered 2024-06-21: 155 [IU] via INTRAMUSCULAR

## 2024-06-21 NOTE — Progress Notes (Signed)
 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

## 2024-06-27 ENCOUNTER — Ambulatory Visit

## 2024-06-27 DIAGNOSIS — M19072 Primary osteoarthritis, left ankle and foot: Secondary | ICD-10-CM | POA: Diagnosis not present

## 2024-06-27 DIAGNOSIS — M19071 Primary osteoarthritis, right ankle and foot: Secondary | ICD-10-CM | POA: Diagnosis not present

## 2024-06-27 NOTE — Progress Notes (Signed)
 Oop max is met no oop    Orthotics   Patient was present and evaluated for Custom molded foot orthotics. Patient will benefit from CFO's to provide total contact to BIL MLA's helping to balance and distribute body weight more evenly across BIL feet helping to reduce plantar pressure and pain. Orthotic will also encourage FF / RF alignment  Patient was scanned today and will return for fitting upon receipt

## 2024-07-04 ENCOUNTER — Encounter: Admitting: Podiatry

## 2024-07-25 ENCOUNTER — Other Ambulatory Visit

## 2024-07-25 ENCOUNTER — Ambulatory Visit: Admitting: Podiatry

## 2024-07-25 DIAGNOSIS — M778 Other enthesopathies, not elsewhere classified: Secondary | ICD-10-CM

## 2024-07-25 NOTE — Patient Instructions (Signed)

## 2024-07-26 ENCOUNTER — Encounter: Payer: Self-pay | Admitting: Podiatry

## 2024-07-26 NOTE — Progress Notes (Signed)
 Subjective:   Patient ID: Janet Gallegos, female   DOB: 43 y.o.   MRN: [id number]   HPI Patient doing well here to pick up orthotics    ROS      Objective:  Physical Exam  Neurovascular status intact with orthotics tested fitted well      Assessment:  Here for orthotics     Plan:  Orthotics fitted they seem to do well instructions given reappoint to recheck as needed

## 2024-08-08 ENCOUNTER — Other Ambulatory Visit (HOSPITAL_COMMUNITY): Payer: Self-pay

## 2024-08-08 ENCOUNTER — Telehealth: Payer: Self-pay

## 2024-08-08 NOTE — Telephone Encounter (Signed)
 Pharmacy Patient Advocate Encounter  Received notification from [employer] that Prior Authorization for Nurtec has been APPROVED from 08/08/2024 to 08/08/2026. Ran test claim, Copay is $0. This test claim was processed through [facility]- copay amounts may vary at other pharmacies due to pharmacy/plan contracts, or as the patient moves through the different stages of their insurance plan.   PA #/Case ID/Reference #: [id number]

## 2024-09-12 ENCOUNTER — Other Ambulatory Visit: Payer: Self-pay

## 2024-09-12 ENCOUNTER — Telehealth: Payer: Self-pay

## 2024-09-12 DIAGNOSIS — G43019 Migraine without aura, intractable, without status migrainosus: Secondary | ICD-10-CM

## 2024-09-12 MED ORDER — BOTOX 100 UNITS IJ SOLR: INTRAMUSCULAR | 1 refills | Status: AC

## 2024-09-12 NOTE — Telephone Encounter (Signed)
 Sent to [facility]

## 2024-09-12 NOTE — Telephone Encounter (Signed)
 Orthotics are in [place] office. Lft msg for Teisha to stop by [place] location to pick up. She doesn't need an appointment. Sending to [place] today.

## 2024-09-12 NOTE — Telephone Encounter (Signed)
 Please send Botox refills to [facility], thank you!

## 2024-09-26 ENCOUNTER — Ambulatory Visit: Admitting: Family Medicine

## 2024-09-27 ENCOUNTER — Encounter: Payer: Self-pay | Admitting: Family Medicine

## 2024-09-27 ENCOUNTER — Telehealth: Payer: Self-pay | Admitting: Family Medicine

## 2024-09-27 ENCOUNTER — Ambulatory Visit: Admitting: Family Medicine

## 2024-09-27 VITALS — BP 122/78 | HR 111 | Ht 64.0 in | Wt 188.0 lb

## 2024-09-27 DIAGNOSIS — G43019 Migraine without aura, intractable, without status migrainosus: Secondary | ICD-10-CM | POA: Diagnosis not present

## 2024-09-27 MED ORDER — NURTEC 75 MG PO TBDP: 1.0000 | ORAL_TABLET | Freq: Every day | ORAL | 11 refills | Status: AC | PRN

## 2024-09-27 MED ORDER — ONABOTULINUMTOXINA 200 UNITS IJ SOLR
155.0000 [IU] | Freq: Once | INTRAMUSCULAR | Status: AC
  Administered 2024-09-27: 155 [IU] via INTRAMUSCULAR

## 2024-09-27 NOTE — Progress Notes (Signed)
 "  09/27/2024 ALL: Janet Gallegos returns for Botox . She continues to do well.   06/21/2024 ALL: Janet Gallegos returns for Botox . She is doing well. She has had 1 migraine since last visit. She has not taken Ajovy  in the past 3-4 months due to having a hard time getting it through pharmacy. She feels migraines may have improved following hysterectomy. She wishes to continue Botox  and Nurtec for now. May restart Ajovy  in the future, if needed.   03/29/2024 ALL: Janet Gallegos returns for Botox . She is doing great. She continues Ajovy  and Nurtec. She is s/p hysterectomy and recovering well.   01/05/2024 ALL: Janet Gallegos returns for Botox . Migraines remain well managed. Maybe 1-2 per month. She continues Ajovy  and Nurtec. She is having right sided temporal and jaw sensitivity following Covid and influenza infections about a month ago. She has seen PCP.   10/01/2023 ALL: Janet Gallegos returns for Botox . She continues to do well. She continues Ajovy  injections monthly. May have 1-2 migraines per month. Nurtec works well for abortive therapy.   07/08/2023 ALL: Janet Gallegos returns for Botox . She reports migraines have been much better. Less stress. She hasn't had a migraine in over a month.   04/14/2023 ALL: She returns for Botox . Migraines have been more frequent and intense, recently. She received IV cocktail 01/2023 that worked well for abortive therapy. She did not take oral taper. Headaches have been manageable. Usually 1-2 migraines per month. Toward end of cyle she may have 4-5. She continues Ajovy  and Nurtec.   01/14/2023 ALL: She returns for Botox . She is doing well. She continues Ajovy  and Nurtec. Migraines are usually well managed until last two weeks before Botox . Nurtec works well.   10/21/2022 ALL: Janet Gallegos returns for Botox . She continues Ajovy  and Nurtec. Migraines are well managed.   07/29/2022 ALL: Janet Gallegos returns for Botox . She continues Ajovy  and Nurtec. She is doing well. Migraines have settled down.   05/06/2022 ALL:  She returns for Botox . She is doing well. She did have a breakthrough migraine in the setting of abrupt discontinuation of Vyvanse and travel to [place]. She is using Nurtec as needed that does help.   01/23/2022 ALL: She returns for Botox . She continues Ajovy  and Nurtec. She reports doing very well. She has only had one migraine since last visit.   10/31/2021 ALL: She returns for Botox . Ajovy  seems to be helping. Migraines are less frequent and less severe. Nurtec helps with abortive therapy. She is feeling well, today   08/06/2021 ALL: She continues Botox  and Nurtec. We started Ajovy  about 2 weeks ago after worsening and prolonged migraines. She has not picked up from pharmacy yet. She was encouraged to take Nurtec with Tylenol , ibuprofen  and Benadryl . She is doing a little better but continues to have regular headache days, especially with storms.   05/02/2021 ALL: She continues Botox  and Nurtec. She is doing well. She did have an intractable migraine about 2 weeks ago. Decadron  on back order and medrol  dose pack was given. It did help.   01/30/2021 ALL: She continues to do well with Botox  therapy. Nurtec works well for abortive therapy. She will occasionally use rizatriptan  if Nurtec not effective. She has had 1 migraine this month after being out of Vyvanse for a few days.   10/25/2020 ALL: She is doing well. Botox  continues to be effective in migraine management. Nurtec helps with abortive therapy.   07/24/2020 ALL: She is doing well. Migraines are well managed. She has received Nurtec and it is working well. She  was recently started on Vyvanse for ADHD. No worsening in migraines thus far.   Consent Form Botulism Toxin Injection For Chronic Migraine    Reviewed orally with patient, additionally signature is on file:  Botulism toxin has been approved by the Federal drug administration for treatment of chronic migraine. Botulism toxin does not cure chronic migraine and it may not be effective in  some patients.  The administration of botulism toxin is accomplished by injecting a small amount of toxin into the muscles of the neck and head. Dosage must be titrated for each individual. Any benefits resulting from botulism toxin tend to wear off after 3 months with a repeat injection required if benefit is to be maintained. Injections are usually done every 3-4 months with maximum effect peak achieved by about 2 or 3 weeks. Botulism toxin is expensive and you should be sure of what costs you will incur resulting from the injection.  The side effects of botulism toxin use for chronic migraine may include:   -Transient, and usually mild, facial weakness with facial injections  -Transient, and usually mild, head or neck weakness with head/neck injections  -Reduction or loss of forehead facial animation due to forehead muscle weakness  -Eyelid drooping  -Dry eye  -Pain at the site of injection or bruising at the site of injection  -Double vision  -Potential unknown long term risks   Contraindications: You should not have Botox  if you are pregnant, nursing, allergic to albumin, have an infection, skin condition, or muscle weakness at the site of the injection, or have myasthenia gravis, Lambert-Eaton syndrome, or ALS.  It is also possible that as with any injection, there may be an allergic reaction or no effect from the medication. Reduced effectiveness after repeated injections is sometimes seen and rarely infection at the injection site may occur. All care will be taken to prevent these side effects. If therapy is given over a long time, atrophy and wasting in the muscle injected may occur. Occasionally the patient's become refractory to treatment because they develop antibodies to the toxin. In this event, therapy needs to be modified.  I have read the above information and consent to the administration of botulism toxin.   BOTOX  PROCEDURE NOTE FOR MIGRAINE HEADACHE  Contraindications and  precautions discussed with patient(above). Aseptic procedure was observed and patient tolerated procedure. Procedure performed by Greig Forbes, FNP-C.   The condition has existed for more than 6 months, and pt does not have a diagnosis of ALS, Myasthenia Gravis or Lambert-Eaton Syndrome.  Risks and benefits of injections discussed and pt agrees to proceed with the procedure.  Written consent obtained  These injections are medically necessary. Pt  receives good benefits from these injections. These injections do not cause sedations or hallucinations which the oral therapies may cause.   Description of procedure:  The patient was placed in a sitting position. The standard protocol was used for Botox  as follows, with 5 units of Botox  injected at each site:  -Procerus muscle, midline injection  -Corrugator muscle, bilateral injection  -Frontalis muscle, bilateral injection, with 2 sites each side, medial injection was performed in the upper one third of the frontalis muscle, in the region vertical from the medial inferior edge of the superior orbital rim. The lateral injection was again in the upper one third of the forehead vertically above the lateral limbus of the cornea, 1.5 cm lateral to the medial injection site.  -Temporalis muscle injection, 4 sites, bilaterally. The first injection was 3  cm above the tragus of the ear, second injection site was 1.5 cm to 3 cm up from the first injection site in line with the tragus of the ear. The third injection site was 1.5-3 cm forward between the first 2 injection sites. The fourth injection site was 1.5 cm posterior to the second injection site. 5th site laterally in the temporalis  muscleat the level of the outer canthus.  -Occipitalis muscle injection, 3 sites, bilaterally. The first injection was done one half way between the occipital protuberance and the tip of the mastoid process behind the ear. The second injection site was done lateral and superior  to the first, 1 fingerbreadth from the first injection. The third injection site was 1 fingerbreadth superiorly and medially from the first injection site.  -Cervical paraspinal muscle injection, 2 sites, bilaterally. The first injection site was 1 cm from the midline of the cervical spine, 3 cm inferior to the lower border of the occipital protuberance. The second injection site was 1.5 cm superiorly and laterally to the first injection site.  -Trapezius muscle injection was performed at 3 sites, bilaterally. The first injection site was in the upper trapezius muscle halfway between the inflection point of the neck, and the acromion. The second injection site was one half way between the acromion and the first injection site. The third injection was done between the first injection site and the inflection point of the neck.   Will return for repeat injection in 3 months.   A total of 200 units of Botox  was prepared, 155 units of Botox  was injected as documented above, any Botox  not injected was wasted. The patient tolerated the procedure well, there were no complications of the above procedure. "

## 2024-09-27 NOTE — Progress Notes (Signed)
 Botox - 100 units x 2 vial Lot: [id number], [id number] Expiration: 2027/06, 2027/06 NDC: 9976-8854-98, 9976-8854-97  Bacteriostatic 0.9% Sodium Chloride - 4mL total Lot: [id number] Expiration: 2026-Oct-31 NDC: 9590-8033-97  Dx: G43.019 SP  Witnessed by: Delon ORN

## 2024-09-27 NOTE — Telephone Encounter (Signed)
 LVM and sent MyChart msg requesting cb to r/s 1/26 Botox - office closed due to weather.

## 2024-12-26 ENCOUNTER — Ambulatory Visit: Admitting: Family Medicine

## 2025-01-11 ENCOUNTER — Ambulatory Visit: Admitting: Urology
